# Patient Record
Sex: Female | Born: 1955
Health system: Southern US, Community
[De-identification: ages and names within clinical notes are randomized; demographics above are authoritative.]

## PROBLEM LIST (undated history)

## (undated) DIAGNOSIS — K224 Dyskinesia of esophagus: Secondary | ICD-10-CM

## (undated) DIAGNOSIS — M542 Cervicalgia: Secondary | ICD-10-CM

## (undated) DIAGNOSIS — M25519 Pain in unspecified shoulder: Secondary | ICD-10-CM

## (undated) DIAGNOSIS — D708 Other neutropenia: Secondary | ICD-10-CM

## (undated) DIAGNOSIS — E041 Nontoxic single thyroid nodule: Secondary | ICD-10-CM

## (undated) HISTORY — DX: Cervicalgia: M54.2

## (undated) HISTORY — DX: Other neutropenia: D70.8

## (undated) HISTORY — PX: BIOPSY THYROID: PRO38

## (undated) HISTORY — PX: HEMORROIDECTOMY: SUR656

## (undated) HISTORY — DX: Pain in unspecified shoulder: M25.519

## (undated) HISTORY — DX: Nontoxic single thyroid nodule: E04.1

---

## 2007-06-10 ENCOUNTER — Encounter (INDEPENDENT_AMBULATORY_CARE_PROVIDER_SITE_OTHER): Payer: Self-pay | Admitting: Ophthalmology

## 2007-06-10 ENCOUNTER — Ambulatory Visit (HOSPITAL_BASED_OUTPATIENT_CLINIC_OR_DEPARTMENT_OTHER): Admission: RE | Admit: 2007-06-10 | Discharge: 2007-06-10 | Payer: Self-pay | Admitting: Ophthalmology

## 2008-08-11 ENCOUNTER — Ambulatory Visit: Payer: Self-pay | Admitting: Internal Medicine

## 2008-08-11 DIAGNOSIS — R05 Cough: Secondary | ICD-10-CM | POA: Insufficient documentation

## 2008-11-02 ENCOUNTER — Ambulatory Visit: Payer: Self-pay | Admitting: Internal Medicine

## 2008-12-13 ENCOUNTER — Encounter: Payer: Self-pay | Admitting: Internal Medicine

## 2008-12-15 ENCOUNTER — Ambulatory Visit: Payer: Self-pay | Admitting: Internal Medicine

## 2008-12-15 ENCOUNTER — Telehealth: Payer: Self-pay | Admitting: Internal Medicine

## 2008-12-15 DIAGNOSIS — M5412 Radiculopathy, cervical region: Secondary | ICD-10-CM | POA: Insufficient documentation

## 2008-12-15 HISTORY — DX: Radiculopathy, cervical region: M54.12

## 2008-12-18 LAB — CONVERTED CEMR LAB
ALT: 15 units/L (ref 0–35)
Albumin: 4.3 g/dL (ref 3.5–5.2)
BUN: 7 mg/dL (ref 6–23)
Basophils Relative: 0.2 % (ref 0.0–3.0)
Bilirubin Urine: NEGATIVE
Bilirubin, Direct: 0.2 mg/dL (ref 0.0–0.3)
CO2: 30 meq/L (ref 19–32)
Calcium: 9.5 mg/dL (ref 8.4–10.5)
Creatinine, Ser: 0.5 mg/dL (ref 0.4–1.2)
Eosinophils Relative: 0.9 % (ref 0.0–5.0)
GFR calc Af Amer: 165 mL/min
Glucose, Bld: 92 mg/dL (ref 70–99)
HCT: 42.5 % (ref 36.0–46.0)
HDL: 69.9 mg/dL (ref >39.0)
Hemoglobin: 14.2 g/dL (ref 12.0–15.0)
Leukocytes, UA: NEGATIVE
Lymphocytes Relative: 66.2 % — ABNORMAL HIGH (ref 12.0–46.0)
Monocytes Absolute: 0.3 10*3/uL (ref 0.1–1.0)
Monocytes Relative: 9.2 % (ref 3.0–12.0)
Neutro Abs: 0.7 10*3/uL — ABNORMAL LOW (ref 1.4–7.7)
Nitrite: NEGATIVE
RBC: 4.61 M/uL (ref 3.87–5.11)
Specific Gravity, Urine: 1.01 (ref 1.000–1.03)
TSH: 0.76 microintl units/mL (ref 0.35–5.50)
Total CHOL/HDL Ratio: 2.5
Total Protein, Urine: NEGATIVE mg/dL
Total Protein: 7.2 g/dL (ref 6.0–8.3)
Triglycerides: 58 mg/dL (ref 0–149)
pH: 6.5 (ref 5.0–8.0)

## 2008-12-22 ENCOUNTER — Encounter: Payer: Self-pay | Admitting: Internal Medicine

## 2008-12-22 ENCOUNTER — Telehealth (INDEPENDENT_AMBULATORY_CARE_PROVIDER_SITE_OTHER): Payer: Self-pay | Admitting: *Deleted

## 2008-12-22 ENCOUNTER — Ambulatory Visit (HOSPITAL_COMMUNITY): Admission: RE | Admit: 2008-12-22 | Discharge: 2008-12-22 | Payer: Self-pay | Admitting: Internal Medicine

## 2008-12-22 ENCOUNTER — Ambulatory Visit: Payer: Self-pay | Admitting: Internal Medicine

## 2008-12-23 ENCOUNTER — Telehealth: Payer: Self-pay | Admitting: Pulmonary Disease

## 2008-12-25 LAB — CONVERTED CEMR LAB: Vit D, 1,25-Dihydroxy: 33 (ref 30–89)

## 2008-12-29 ENCOUNTER — Encounter: Payer: Self-pay | Admitting: Internal Medicine

## 2009-03-29 ENCOUNTER — Ambulatory Visit: Payer: Self-pay | Admitting: Internal Medicine

## 2009-04-10 ENCOUNTER — Encounter: Payer: Self-pay | Admitting: Internal Medicine

## 2009-05-04 ENCOUNTER — Ambulatory Visit: Payer: Self-pay | Admitting: Internal Medicine

## 2009-05-04 DIAGNOSIS — E041 Nontoxic single thyroid nodule: Secondary | ICD-10-CM

## 2009-05-07 ENCOUNTER — Encounter: Payer: Self-pay | Admitting: Internal Medicine

## 2009-05-11 ENCOUNTER — Encounter: Payer: Self-pay | Admitting: Internal Medicine

## 2009-05-11 ENCOUNTER — Ambulatory Visit (HOSPITAL_COMMUNITY): Admission: RE | Admit: 2009-05-11 | Discharge: 2009-05-11 | Payer: Self-pay | Admitting: Internal Medicine

## 2009-05-11 ENCOUNTER — Encounter (INDEPENDENT_AMBULATORY_CARE_PROVIDER_SITE_OTHER): Payer: Self-pay | Admitting: Interventional Radiology

## 2009-05-16 ENCOUNTER — Telehealth: Payer: Self-pay | Admitting: Internal Medicine

## 2009-05-16 ENCOUNTER — Encounter: Payer: Self-pay | Admitting: Internal Medicine

## 2009-05-18 HISTORY — PX: CERVICAL FUSION: SHX112

## 2009-05-18 HISTORY — PX: CERVICAL DISCECTOMY: SHX98

## 2009-06-14 ENCOUNTER — Telehealth: Payer: Self-pay | Admitting: Internal Medicine

## 2009-06-19 ENCOUNTER — Encounter: Admission: RE | Admit: 2009-06-19 | Discharge: 2009-08-09 | Payer: Self-pay | Admitting: Neurosurgery

## 2009-07-17 ENCOUNTER — Ambulatory Visit: Payer: Self-pay | Admitting: Internal Medicine

## 2009-08-20 ENCOUNTER — Encounter: Payer: Self-pay | Admitting: Internal Medicine

## 2009-08-31 ENCOUNTER — Ambulatory Visit: Payer: Self-pay | Admitting: Internal Medicine

## 2009-09-03 DIAGNOSIS — H6120 Impacted cerumen, unspecified ear: Secondary | ICD-10-CM | POA: Insufficient documentation

## 2009-09-03 HISTORY — DX: Impacted cerumen, unspecified ear: H61.20

## 2010-04-26 ENCOUNTER — Ambulatory Visit: Payer: Self-pay | Admitting: Internal Medicine

## 2010-04-26 DIAGNOSIS — T148 Other injury of unspecified body region: Secondary | ICD-10-CM

## 2010-04-26 DIAGNOSIS — W57XXXA Bitten or stung by nonvenomous insect and other nonvenomous arthropods, initial encounter: Secondary | ICD-10-CM

## 2010-07-31 ENCOUNTER — Ambulatory Visit: Payer: Self-pay | Admitting: Internal Medicine

## 2010-07-31 DIAGNOSIS — D708 Other neutropenia: Secondary | ICD-10-CM

## 2010-08-01 LAB — CONVERTED CEMR LAB
AST: 17 units/L (ref 0–37)
Albumin: 4.3 g/dL (ref 3.5–5.2)
Alkaline Phosphatase: 37 units/L — ABNORMAL LOW (ref 39–117)
Basophils Relative: 0.2 % (ref 0.0–3.0)
Bilirubin Urine: NEGATIVE
CO2: 30 meq/L (ref 19–32)
Calcium: 9.4 mg/dL (ref 8.4–10.5)
Eosinophils Absolute: 0 10*3/uL (ref 0.0–0.7)
HCT: 41.3 % (ref 36.0–46.0)
HDL: 73.6 mg/dL (ref >39.00)
Hemoglobin, Urine: NEGATIVE
Hemoglobin: 14.4 g/dL (ref 12.0–15.0)
Ketones, ur: NEGATIVE mg/dL
Lymphocytes Relative: 67.7 % — ABNORMAL HIGH (ref 12.0–46.0)
MCHC: 34.8 g/dL (ref 30.0–36.0)
Monocytes Relative: 5.7 % (ref 3.0–12.0)
Neutro Abs: 0.6 10*3/uL — ABNORMAL LOW (ref 1.4–7.7)
Potassium: 4.9 meq/L (ref 3.5–5.1)
RBC: 4.49 M/uL (ref 3.87–5.11)
Sodium: 143 meq/L (ref 135–145)
Total CHOL/HDL Ratio: 2
Total Protein: 6.9 g/dL (ref 6.0–8.3)
Urine Glucose: NEGATIVE mg/dL
Urobilinogen, UA: 0.2 (ref 0.0–1.0)
Vit D, 25-Hydroxy: 44 ng/mL (ref 30–89)

## 2010-12-24 NOTE — Assessment & Plan Note (Signed)
Summary: Primary svc/ CPX - ANC .7 > .6 now    Primary Provider/Referring Provider:  [facility]  CC:  CPX.  Fasting.  Marland Kitchen  History of Present Illness: 68 yobf smoked only in HS with new cough since 2007 indolent onset assoc with sensation of pnds more day than night minimally productive esp in am,  assoc with occ HB.  Cough resolved on ppi and decongestants.  July 31, 2010 cpx no complaints. Pt denies any significant sore throat, dysphagia, itching, sneezing,  nasal congestion or excess secretions,  fever, chills, sweats, unintended wt loss, pleuritic or exertional cp, hempoptysis, change in activity tolerance  orthopnea pnd or leg swelling        Current Medications (verified): 1)  Advil 200 Mg Tabs (Ibuprofen) .... 3-4 Advil With Meals Daily Prn  (Up To Advil As Long As With Meals)  Allergies (verified): No Known Drug Allergies  Past History:  Past Medical History: Health Maintenance     - CPX  July 31, 2010      - DT  December 15, 2008      - DEXA  12/22/2008  T spine + .9  LFem -1.2,  R Fem -.7     - GYN [place] Mammograms/pap smears Neck/shoulder pain radicular C7/8 right     - MRI  12/22/2008 Pos C7 disc protrusion > Discectomy and fusion 05/18/2009 [facility] [place] Thyroid nodule     - MRI 12/22/08     - U/S and bx  May 11, 2009 > c/w benign goiter, TSH nl so no rx Relative chronic Neutropenia       - ANC .7 2010       - ANC July 31, 2010   Family History: Mother died from ovarian cancer Father's family with throat cancer (smokers)  Lupus sister Negative for respiratory diseases  Allergies brother   Social History: Never smoker Married works in [employer] for [employer]  Vital Signs:  Patient profile:   55 year old female Height:      65 inches Weight:      128 pounds BMI:     21.38 O2 Sat:      98 % on Room air Temp:     98.2 degrees F oral Pulse rate:   72 / minute BP sitting:   110 / 80  (left arm) Cuff size:    regular  Vitals Entered By: Gweneth Dimitri RN (July 31, 2010 9:00 AM)  O2 Flow:  Room air CC: CPX.  Fasting.   Comments Medications reviewed with patient Daytime contact number verified with patient. Gweneth Dimitri RN  July 31, 2010 9:01 AM    Physical Exam  Additional Exam:  wt  128 Mar 29, 2009 > 129 May 05, 2009 >>126 August 31, 2009 > 128 April 26, 2010 > 128 .ate  Ambulatory healthy appearing in no acute distress. Afeb with normal vital signs HEENT: nl dentition, turbinates, and orophanx.  external ear canals severe  wax bilaterally, left > right  Neck without JVD/Nodes/TM/ no pain on flex/ext.  No palp thyroid nodules Lungs clear to A and P bilaterally without cough on insp or exp maneuvers RRR no s3 or murmur or increase in P2 Abd soft and benign with nl excursion in the supine position. No bruits or organomegaly Ext warm without calf tenderness, cyanosis clubbing or edema Skin: ok  Cholesterol  181 mg/dL                   1-610     ATP III Classification            Desirable:  < 200 mg/dL                    Borderline High:  200 - 239 mg/dL               High:  > = 240 mg/dL   Triglycerides             48.0 mg/dL                  9.6-045.4     Normal:  <150 mg/dL     Borderline High:  098 - 199 mg/dL   HDL                       11.91 mg/dL                 >47.82   VLDL Cholesterol          9.6 mg/dL                   9.5-62.1   LDL Cholesterol           98 mg/dL                    3-08  CHO/HDL Ratio:  CHD Risk                             2                    Men          Women     1/2 Average Risk     3.4          3.3     Average Risk          5.0          4.4     2X Average Risk          9.6          7.1     3X Average Risk          15.0          11.0                           Tests: (2) BMP (METABOL)   Sodium                    143 mEq/L                   135-145   Potassium                 4.9 mEq/L                   3.5-5.1   Chloride                   107 mEq/L                   96-112   Carbon Dioxide            30 mEq/L  19-32   Glucose                   86 mg/dL                    16-10   BUN                       6 mg/dL                     9-60   Creatinine                0.5 mg/dL                   4.5-4.0   Calcium                   9.4 mg/dL                   9.8-11.9   GFR                       184.97 mL/min               >60  Tests: (3) CBC Platelet w/Diff (CBCD)   White Cell Count     [L]  2.4 K/uL                    4.5-10.5     AUTO DIFF CONFIRMED ON SMEAR   Red Cell Count            4.49 Mil/uL                 3.87-5.11   Hemoglobin                14.4 g/dL                   14.7-82.9   Hematocrit                41.3 %                      36.0-46.0   MCV                       91.9 fl                     78.0-100.0   MCHC                      34.8 g/dL                   56.2-13.0   RDW                       12.9 %                      11.5-14.6   Platelet Count            161.0 K/uL                  150.0-400.0   Neutrophil %         [L]  25.6 %                      43.0-77.0   Lymphocyte %         [  H]  67.7 %                      12.0-46.0   Monocyte %                5.7 %                       3.0-12.0   Eosinophils%              0.8 %                       0.0-5.0   Basophils %               0.2 %                       0.0-3.0   Neutrophill Absolute [L]  0.6 K/uL                    1.4-7.7   Lymphocyte Absolute       1.6 K/uL                    0.7-4.0   Monocyte Absolute         0.1 K/uL                    0.1-1.0  Eosinophils, Absolute                             0.0 K/uL                    0.0-0.7   Basophils Absolute        0.0 K/uL                    0.0-0.1  Tests: (4) Hepatic/Liver Function Panel (HEPATIC)   Total Bilirubin           0.8 mg/dL                   1.6-1.0   Direct Bilirubin          0.1 mg/dL                   9.6-0.4   Alkaline Phosphatase [L]  37 U/L                       39-117   AST                       17 U/L                      0-37   ALT                       14 U/L                      0-35   Total Protein             6.9 g/dL                    5.4-0.9   Albumin                   4.3 g/dL  3.5-5.2  Tests: (5) TSH (TSH)   FastTSH                   1.19 uIU/mL                 0.35-5.50  Tests: (6) UDip Only (UDIP)   Color                     YELLOW       RANGE:  Yellow;Lt. Yellow   Clarity                   CLEAR                       Clear   Specific Gravity          1.020                       1.000 - 1.030   Urine Ph                  7.0                         5.0-8.0   Protein                   NEGATIVE                    Negative   Urine Glucose             NEGATIVE                    Negative   Ketones                   NEGATIVE                    Negative   Urine Bilirubin           NEGATIVE                    Negative   Blood                     NEGATIVE                    Negative   Urobilinogen              0.2                         0.0 - 1.0   Leukocyte Esterace        NEGATIVE                    Negative   Nitrite                   NEGATIVE                    Negative   Impression & Recommendations:  Problem # 1:  CERUMEN IMPACTION, BILATERAL (ICD-380.4) discussed use of otc's f/u here or ent as needed   Problem # 2:  COUGH (ICD-786.2)  Classic Upper airway cough syndrome, so named because it's frequently impossible to sort out how much is  CR/sinusitis with freq throat clearing (which can be related to primary GERD)  vs  causing  secondary extra esophageal GERD from wide swings in gastric pressure that occur with throat clearing, promoting self use of mint and menthol lozenges that reduce the lower esophageal sphincter tone and exacerbate the problem further These are the same pts who not infrequently have failed to tolerate ace inhibitors,  dry powder inhalers or biphosphonates or report having  reflux symptoms that don't respond to standard doses of PPI   Recs reviewed  Problem # 3:  OTHER NEUTROPENIA (ICD-288.09) 3.0 > 2.4 total wbc's with absolute .7 > .6 so not technically neutropenic but close, present x 2 years so this is likely constittutional, will discuss ? w/u with hematology.  Medications Added to Medication List This Visit: 1)  Advil 200 Mg Tabs (Ibuprofen) .... 3-4 advil with meals daily prn  (up to advil as long as with meals)  Other Orders: T-Vitamin D (25-Hydroxy) (98119-14782) Est. Patient 40-64 years (95621) TLB-Lipid Panel (80061-LIPID) TLB-BMP (Basic Metabolic Panel-BMET) (80048-METABOL) TLB-CBC Platelet - w/Differential (85025-CBCD) TLB-Hepatic/Liver Function Pnl (80076-HEPATIC) TLB-TSH (Thyroid Stimulating Hormone) (84443-TSH) TLB-Udip ONLY (81003-UDIP)  Patient Instructions: 1)  Ear wax removal kit is over the counter 2)  Use this first and call if the wax still bothers you  3)  Call [phone] for your results w/in next 3 days - if there's something important  I feel you need to know,  I'll be in touch with you directly.  4)  Centrum A to Z one daily and tums es after meals would you give you all the vit d and calcium you need 5)  CPX age 69

## 2010-12-24 NOTE — Assessment & Plan Note (Signed)
Summary: Primary svc/ insect bite / f/u thyroid bx   Primary Provider/Referring Provider:  [facility]  CC:  Acute visit.  Pt states that she was bit by some sort of insect i=on the back of left knee 6 days ago.  She states that it started out as blister and now the area is red and itchy.  She states that yesterday she was feeling lightheaded and nauseas.  .  History of Present Illness: 79 yobf smoked only in HS with new cough since 2007 indolent onset assoc with sensation of pnds more day than night minimally productive esp in am,  assoc with occ HB.  no response to mucinex or advil cold and sinus to date.  08/11/08 initial eval rx with ppi and chlortrimeton and diet but not consistent with either.  November 02, 2008  99% better but still coughing daily, mostly throat clearing, not bothering sleep, no assoc itching or sneezing.  Pt also denies any obvious fluctuation in symptoms with weather or environmental change or other alleviating or aggravating factors.  Pt also denies any obvious fluctuation in symptoms with weather or environmental change or other alleviating or aggravating factors.   In terms of palpitations does not having exac with ex and activity tolerance is nl.   December 15, 2008 cpx stopped nexium shortly after started actifed,  3 -4 months right > left numbness in right 4th and 5th fingers  Mar 29, 2009 worse pain and numbness esp C7/8 distribution  on right , no better on relafen, has not tried relafen. No fever, leg weakness, change in bowel or bladder habits.  May 04, 2009 ov still with right shoulder pain and C7/8 numbness extending to 4th and 5th digits,  aggravated by sitting at computer,  tried relafen upset stomach, ultimately had fusion in [place] 6/25  April 26, 2010 Acute visit.  Pt states that she was bit by some sort of insect  back of left knee 6 days ago.  She states that it started out as blister and now the area is red and itchy.  She states that  yesterday she was feeling lightheaded and nauseas.  benadryl helped some, no generalized itching or sob. Pt denies any significant sore throat, dysphagia, sneezing,  nasal congestion or excess secretions,  fever, chills, sweats, unintended wt loss, pleuritic or exertional cp, hempoptysis, change in activity tolerance  orthopnea pnd or leg swelling         Current Medications (verified): 1)  Tramadol Hcl 50 Mg  Tabs (Tramadol Hcl) .... One To Two By Mouth Every 4-6 Hours As Needed For Pain 2)  Advil 200 Mg Tabs (Ibuprofen) .... 3-4 Advil With Meals Daily (Up To Advil As Long As With Meals)  Allergies (verified): No Known Drug Allergies  Past History:  Past Medical History: Health Maintenance     - CPX  12/15/2008     - DT  December 15, 2008      - DEXA  12/22/2008  T spine + .9  LFem -1.2,  R Fem -.7 Neck/shoulder pain radicular C7/8 right     - MRI  12/22/2008 Pos C7 disc protrusion > Discectomy and fusion 05/18/2009 [facility] [place] Thyroid nodule     - MRI 12/22/08     - U/S and bx  May 11, 2009 > c/w benign goiter, TSH nl so no rx  Vital Signs:  Patient profile:   55 year old female Weight:      128  pounds O2 Sat:      99 % on Room air Temp:     98.3 degrees F oral Pulse rate:   83 / minute BP sitting:   128 / 88  (left arm)  Vitals Entered By: Vernie Murders (April 26, 2010 9:43 AM)  O2 Flow:  Room air  Physical Exam  Additional Exam:  wt  128 Mar 29, 2009 > 129 May 05, 2009 >>126 August 31, 2009 > 128 April 26, 2010  Ambulatory healthy appearing in no acute distress. Afeb with normal vital signs HEENT: nl dentition, turbinates, and orophanx.  external ear canals moderate wax bilaterally, left > right  Neck without JVD/Nodes/TM/ no pain on flex/ext.  No palp thyroid nodules Lungs clear to A and P bilaterally without cough on insp or exp maneuvers RRR no s3 or murmur or increase in P2 Abd soft and benign with nl excursion in the supine position. No bruits or  organomegaly Ext warm without calf tenderness, cyanosis clubbing or edema Skin:  punctate raised ulcerated lesion  less than 5 mm diam with slt raised borders and surrounding secondary changes about the side of palm in fold of L knee posteriorly with erthema, no induration.   Impression & Recommendations:  Problem # 1:  INSECT BITE (ICD-919.4)  With secondary localized nflammatory changes, rx with H1 and steroid cream, no evidence of spider bite.  Problem # 2:  THYROID NODULE (ICD-241.0)  reviewed bx and tft result, exam nl, so no further studies needed as this was benign and not a clinically active goiter.  Problem # 3:  CERVICAL RADICULOPATHY (ICD-723.4) much better post op with return of strenght though still has paresthesias in dominant hand  Medications Added to Medication List This Visit: 1)  Triamcinolone Acetonide 0.1 % Oint (Triamcinolone acetonide) .... Apply twice daily for up to 2 weeks not be applied to face  Other Orders: Est. Patient Level IV (46962)  Patient Instructions: 1)  Benadryl will help the itching 2)  apply triamcinolone ointment twice daily to rash 3)  If not better in 2 weeks call for dermatology referral Prescriptions: TRIAMCINOLONE ACETONIDE 0.1 % OINT (TRIAMCINOLONE ACETONIDE) apply twice daily for up to 2 weeks not be applied to face  #60 gm x 1   Entered and Authorized by:   Nyoka Cowden MD   Signed by:   Nyoka Cowden MD on 04/26/2010   Method used:   Electronically to        [id number] (retail)       [address]       [place], [place]  [address]       Ph: [phone] or [phone]       Fax: [phone]   RxID:   [id number]

## 2011-07-10 ENCOUNTER — Encounter: Payer: Self-pay | Admitting: Internal Medicine

## 2011-07-11 ENCOUNTER — Encounter: Payer: Self-pay | Admitting: Gastroenterology

## 2011-07-11 ENCOUNTER — Other Ambulatory Visit (INDEPENDENT_AMBULATORY_CARE_PROVIDER_SITE_OTHER): Payer: BC Managed Care – PPO

## 2011-07-11 ENCOUNTER — Ambulatory Visit (INDEPENDENT_AMBULATORY_CARE_PROVIDER_SITE_OTHER): Payer: BC Managed Care – PPO | Admitting: Internal Medicine

## 2011-07-11 ENCOUNTER — Encounter: Payer: Self-pay | Admitting: Internal Medicine

## 2011-07-11 VITALS — BP 112/72 | HR 77 | Temp 97.7°F | Ht 65.0 in | Wt 129.8 lb

## 2011-07-11 DIAGNOSIS — K625 Hemorrhage of anus and rectum: Secondary | ICD-10-CM

## 2011-07-11 DIAGNOSIS — Z0000 Encounter for general adult medical examination without abnormal findings: Secondary | ICD-10-CM

## 2011-07-11 DIAGNOSIS — I4949 Other premature depolarization: Secondary | ICD-10-CM

## 2011-07-11 DIAGNOSIS — I493 Ventricular premature depolarization: Secondary | ICD-10-CM | POA: Insufficient documentation

## 2011-07-11 DIAGNOSIS — D708 Other neutropenia: Secondary | ICD-10-CM

## 2011-07-11 HISTORY — DX: Hemorrhage of anus and rectum: K62.5

## 2011-07-11 LAB — CBC WITH DIFFERENTIAL/PLATELET
Basophils Relative: 0.3 % (ref 0.0–3.0)
Eosinophils Absolute: 0 10*3/uL (ref 0.0–0.7)
Hemoglobin: 14 g/dL (ref 12.0–15.0)
MCHC: 33 g/dL (ref 30.0–36.0)
MCV: 93.4 fl (ref 78.0–100.0)
Monocytes Absolute: 0.2 10*3/uL (ref 0.1–1.0)
Neutro Abs: 0.6 10*3/uL — ABNORMAL LOW (ref 1.4–7.7)
RBC: 4.55 Mil/uL (ref 3.87–5.11)

## 2011-07-11 NOTE — Patient Instructions (Addendum)
Please see patient coordinator before you leave today  to schedule referral to GI due to history of polyps  Citrucel heaping twice daily with large glass of water  Dr Billy [facility] and [facility] are ones I recommend especially if they are planning to do EPIC  Please schedule a follow up visit in 3 months but call sooner if needed for CPX on return

## 2011-07-11 NOTE — Progress Notes (Signed)
  Subjective:    Patient ID: Mariah Everett, female    DOB: [birth year], 55 y.o.   MRN: [id number]  HPI  34 yobf smoked only in HS with new cough since 2007 indolent onset assoc with sensation of pnds more day than night minimally productive esp in am, assoc with occ HB.  >>> resolved on ppi and decongestants.   July 31, 2010 cpx no complaints. No change rx  07/11/2011 f/u ov/Mariah Everett cc blood in stool  Maybe once a week x sev months, has h/o polyps in [place] 6 years ago, wants to establish here. No abd pain, no melena, constipation.  Some abd bloating.  Also occ palpitations  Pt denies any significant sore throat, dysphagia, itching, sneezing,  nasal congestion or excess/ purulent secretions,  fever, chills, sweats, unintended wt loss, pleuritic or exertional cp, hempoptysis, orthopnea pnd or leg swelling.    Also denies any obvious fluctuation of symptoms with weather or environmental changes or other aggravating or alleviating factors.        Past Medical History:  Health Maintenance  - CPX July 31, 2010  - DT December 15, 2008  - DEXA 12/22/2008 T spine + .9 LFem -1.2, R Fem -.7  - GYN [facility] Mammograms/pap smears  Neck/shoulder pain radicular C7/8 right  - MRI 12/22/2008 Pos C7 disc protrusion > Discectomy and fusion 05/18/2009 [facility] [place]  Thyroid nodule  - MRI 12/22/08  - U/S and bx May 11, 2009 > c/w benign goiter, TSH nl so no rx  Relative chronic Neutropenia  - dx  2010  Rectal bleeeding...................................Marland [facility]    Family History:  Mother died from ovarian cancer  Father's family with throat cancer (smokers)  Lupus sister  Negative for respiratory diseases  Allergies brother    Social History:  Never smoker  Married  works in [employer] for [employer]      Review of Systems  Constitutional: Negative for fever and unexpected weight change.  HENT: Negative for ear pain, nosebleeds, congestion, sore throat, rhinorrhea, sneezing,  trouble swallowing, dental problem, postnasal drip and sinus pressure.   Eyes: Negative for redness and itching.  Respiratory: Negative for cough, chest tightness, shortness of breath and wheezing.   Cardiovascular: Negative for palpitations and leg swelling.  Gastrointestinal: Positive for blood in stool. Negative for nausea and vomiting.  Genitourinary: Negative for dysuria.  Musculoskeletal: Negative for joint swelling.  Skin: Negative for rash.  Neurological: Negative for headaches.  Hematological: Does not bruise/bleed easily.  Psychiatric/Behavioral: Negative for dysphoric mood. The patient is not nervous/anxious.        Objective:   Physical Exam   wt 128 Mar 29, 2009 > 129 May 05, 2009 >>126 August 31, 2009 > 128 April 26, 2010 > 129 07/11/2011  Ambulatory healthy appearing in no acute distress.  Afeb with normal vital signs  HEENT: nl dentition, turbinates, and orophanx. external ear canals severe wax bilaterally, left > right  Neck without JVD/Nodes/TM/ no pain on flex/ext. No palp thyroid nodules  Lungs clear to A and P bilaterally without cough on insp or exp maneuvers  RRR no s3 or murmur or increase in P2  Abd soft and benign with nl excursion in the supine position. No bruits or organomegaly  Ext warm without calf tenderness, cyanosis clubbing or edema     Assessment & Plan:

## 2011-07-12 NOTE — Assessment & Plan Note (Signed)
Chronic, asymptomatic, ANC usuall runs around 7-800 no changes planned

## 2011-07-12 NOTE — Assessment & Plan Note (Signed)
Hemoglobin ok. rec citrucel for bloating/ ? Constipation with hem bleeding pending GI eval

## 2011-07-12 NOTE — Assessment & Plan Note (Signed)
Single pvc on ekg, avoid caffeine and decongestants

## 2011-07-21 ENCOUNTER — Telehealth: Payer: Self-pay | Admitting: Internal Medicine

## 2011-07-21 NOTE — Telephone Encounter (Signed)
Call patient : Study is unremarkable, no change in recs- no evidence of fe def or anemia   Spoke with pt and is aware of lab results. Pt verbalized understanding and had no questions

## 2011-08-05 ENCOUNTER — Ambulatory Visit: Payer: BC Managed Care – PPO | Admitting: Gastroenterology

## 2011-09-15 ENCOUNTER — Encounter: Payer: Self-pay | Admitting: Internal Medicine

## 2012-09-16 ENCOUNTER — Encounter: Payer: Self-pay | Admitting: Internal Medicine

## 2012-09-16 ENCOUNTER — Ambulatory Visit (INDEPENDENT_AMBULATORY_CARE_PROVIDER_SITE_OTHER): Payer: BC Managed Care – PPO | Admitting: Internal Medicine

## 2012-09-16 ENCOUNTER — Other Ambulatory Visit (INDEPENDENT_AMBULATORY_CARE_PROVIDER_SITE_OTHER): Payer: BC Managed Care – PPO

## 2012-09-16 VITALS — BP 114/80 | HR 85 | Temp 97.5°F | Ht 65.0 in | Wt 134.0 lb

## 2012-09-16 DIAGNOSIS — M5412 Radiculopathy, cervical region: Secondary | ICD-10-CM

## 2012-09-16 DIAGNOSIS — Z0000 Encounter for general adult medical examination without abnormal findings: Secondary | ICD-10-CM

## 2012-09-16 DIAGNOSIS — H6120 Impacted cerumen, unspecified ear: Secondary | ICD-10-CM

## 2012-09-16 DIAGNOSIS — D708 Other neutropenia: Secondary | ICD-10-CM

## 2012-09-16 DIAGNOSIS — K625 Hemorrhage of anus and rectum: Secondary | ICD-10-CM

## 2012-09-16 LAB — HEPATIC FUNCTION PANEL
ALT: 18 U/L (ref 0–35)
AST: 21 U/L (ref 0–37)
Albumin: 4.2 g/dL (ref 3.5–5.2)
Total Protein: 7.4 g/dL (ref 6.0–8.3)

## 2012-09-16 LAB — CBC WITH DIFFERENTIAL/PLATELET
Basophils Relative: 0.5 % (ref 0.0–3.0)
Eosinophils Relative: 1.5 % (ref 0.0–5.0)
HCT: 43.2 % (ref 36.0–46.0)
Hemoglobin: 14.3 g/dL (ref 12.0–15.0)
Lymphs Abs: 1.4 10*3/uL (ref 0.7–4.0)
MCV: 92.2 fl (ref 78.0–100.0)
Monocytes Absolute: 0.2 10*3/uL (ref 0.1–1.0)
Monocytes Relative: 8.9 % (ref 3.0–12.0)
Neutro Abs: 0.6 10*3/uL — ABNORMAL LOW (ref 1.4–7.7)
Platelets: 193 10*3/uL (ref 150.0–400.0)
WBC: 2.3 10*3/uL — ABNORMAL LOW (ref 4.5–10.5)

## 2012-09-16 LAB — BASIC METABOLIC PANEL
BUN: 7 mg/dL (ref 6–23)
Chloride: 108 mEq/L (ref 96–112)
GFR: 139.76 mL/min (ref >60.00)
Glucose, Bld: 94 mg/dL (ref 70–99)
Potassium: 4.2 mEq/L (ref 3.5–5.1)
Sodium: 142 mEq/L (ref 135–145)

## 2012-09-16 LAB — LIPID PANEL
Cholesterol: 182 mg/dL (ref 0–200)
LDL Cholesterol: 102 mg/dL — ABNORMAL HIGH (ref 0–99)
Triglycerides: 43 mg/dL (ref 0.0–149.0)

## 2012-09-16 NOTE — Assessment & Plan Note (Signed)
Mod severe, rec otc's and f/u for lavage

## 2012-09-16 NOTE — Assessment & Plan Note (Signed)
Lab Results  Component Value Date   WBC 2.3 aL* 09/16/2012    No change baseline nor evidence of infection tendency so likley constitutional.

## 2012-09-16 NOTE — Assessment & Plan Note (Signed)
Negative colonoscopy 09/12/11  Dr Sydell Axon  No sign anemia and with neg colonoscopy, assoc with straining this is most likely hemorrhoidal > rec stool softeners and gi f/u prn

## 2012-09-16 NOTE — Progress Notes (Signed)
Subjective:    Patient ID: Mariah Everett, female    DOB: Oct 11, 1956  MRN: [id number]  HPI  68 yobf smoked only in HS with new cough since 2007 indolent onset assoc with sensation of pnds more day than night minimally productive esp in am, assoc with occ HB.  >>> resolved on ppi and decongestants.   July 31, 2010 cpx no complaints. No change rx  07/11/2011 f/u ov/Mariah Everett cc blood in stool  Maybe once a week x sev months, has h/o polyps in [place] 6 years ago, wants to establish here. No abd pain, no melena, constipation.  Some abd bloating. rec Please see patient coordinator before you leave today  to schedule referral to GI due to history of polyps> neg colonoscopy 08/2011 Citrucel heaping twice daily with large glass of water   09/16/2012 f/u ov/Mariah Everett cc still brpb q 3-4 months lasts a day, attributed to straining - no abd pain, no melena, no wt loss or appetite decrease.  Sleeping ok without nocturnal  or early am exacerbation  of respiratory  C/o's.  Also denies any obvious fluctuation of symptoms with weather or environmental changes or other aggravating or alleviating factors except as outlined above   ROS  The following are not active complaints unless bolded sore throat, dysphagia, dental problems, itching, sneezing,  nasal congestion or excess/ purulent secretions, ear ache,   fever, chills, sweats, unintended wt loss, pleuritic or exertional cp, hemoptysis,  orthopnea pnd or leg swelling, presyncope, palpitations, heartburn, abdominal pain, anorexia, nausea, vomiting, diarrhea  or change in bowel or urinary habits, change in stools or urine, dysuria,hematuria,  rash, arthralgias, visual complaints, headache, numbness weakness or ataxia or problems with walking or coordination,  change in mood/affect or memory.        Past Medical History:  Health Maintenance  - CPX 09/16/2012   - DT December 15, 2008  - DEXA 12/22/2008 T spine + .9 LFem -1.2, R Fem -.7  - GYN Mariah Everett Neck/shoulder  pain radicular C7/8 right  - MRI 12/22/2008 Pos C7 disc protrusion > Discectomy and fusion 05/18/2009 [facility] in [place]  Thyroid nodule  - MRI 12/22/08  - U/S and bx May 11, 2009 > c/w benign goiter, TSH nl so no rx  Relative chronic Neutropenia  - dx  2010  Rectal bleeeding...................................Marland Kitchen  Dr Mariah Everett - nl colonoscopy 08/2011     Family History:  Mother died from ovarian cancer  Father's family with throat cancer (smokers)  Lupus sister  Negative for respiratory diseases  Allergies brother    Social History:  Never smoker  Married  works in [employer] for [employer]            Objective:   Physical Exam   wt 128 Mar 29, 2009  > 128 April 26, 2010 > 129 07/11/2011 > 09/16/2012 134  Ambulatory healthy appearing in no acute distress.  HEENT: nl dentition, turbinates, and orophanx. external ear canals severe wax bilaterally   Neck without JVD/Nodes/TM/ no pain on flex/ext. No palp thyroid nodules  Lungs clear to A and P bilaterally without cough on insp or exp maneuvers  RRR no s3 or murmur or increase in P2  Nl pulses Abd soft and benign with nl excursion in the supine position. No bruits or organomegaly  Ext warm without calf tenderness, cyanosis clubbing or edema MS nl gait no deformities/ restrictions Neuro nl sensorium, no motor or cerebellar def, no pathologic reflexes Skin: no rash, no lesions  Assessment & Plan:

## 2012-09-16 NOTE — Patient Instructions (Addendum)
Please remember to go to the lab department downstairs for your tests - we will call you with the results when they are available.  Try the over the counter ear wax kit for about a week then make an appt to see Tammy NP after you've softened it up and it should be easy to remove  Please schedule a follow up office visit in 1 year, call sooner if needed - CPX on return

## 2012-09-17 LAB — VITAMIN D 25 HYDROXY (VIT D DEFICIENCY, FRACTURES): Vit D, 25-Hydroxy: 49 ng/mL (ref 30–89)

## 2012-09-22 NOTE — Progress Notes (Signed)
Quick Note:  Spoke with pt and notified of results per Dr. Wert. Pt verbalized understanding and denied any questions.  ______ 

## 2012-12-09 ENCOUNTER — Telehealth: Payer: Self-pay | Admitting: Internal Medicine

## 2012-12-09 NOTE — Telephone Encounter (Signed)
I spoke with pt. Aware of MW recs. She will check with insurance and will call us back. Will sign off message in the meantime

## 2012-12-09 NOTE — Telephone Encounter (Signed)
Yes refer to urology - best option is Eudelia Bunch but he is at [facility] and not sure her insurance will cover or won't have to wait x months

## 2012-12-09 NOTE — Telephone Encounter (Signed)
Pt c/o pain and burning with urination and frequency and urgency as well x 2-3 weeks. She did call her OB/GYN and they did a UA culture and results were negative. They also gave her Septra x 3 days and then Cipro x 7 days. The pain in her lower abdomen did ease some while on the abx but never went away. The OB/GYN told her she needs to see a Insurance underwriter. She is calling to get recs from MW, PCP. Pls advise.No Known Allergies

## 2012-12-10 ENCOUNTER — Encounter: Payer: Self-pay | Admitting: Adult Health

## 2012-12-10 ENCOUNTER — Other Ambulatory Visit (INDEPENDENT_AMBULATORY_CARE_PROVIDER_SITE_OTHER): Payer: BC Managed Care – PPO

## 2012-12-10 ENCOUNTER — Ambulatory Visit (INDEPENDENT_AMBULATORY_CARE_PROVIDER_SITE_OTHER): Payer: BC Managed Care – PPO | Admitting: Adult Health

## 2012-12-10 ENCOUNTER — Telehealth: Payer: Self-pay | Admitting: Internal Medicine

## 2012-12-10 VITALS — BP 138/88 | HR 82 | Temp 97.4°F | Ht 65.0 in | Wt 135.8 lb

## 2012-12-10 DIAGNOSIS — R109 Unspecified abdominal pain: Secondary | ICD-10-CM

## 2012-12-10 DIAGNOSIS — R300 Dysuria: Secondary | ICD-10-CM

## 2012-12-10 DIAGNOSIS — R52 Pain, unspecified: Secondary | ICD-10-CM

## 2012-12-10 DIAGNOSIS — R350 Frequency of micturition: Secondary | ICD-10-CM

## 2012-12-10 LAB — URINALYSIS, ROUTINE W REFLEX MICROSCOPIC
Nitrite: NEGATIVE
Total Protein, Urine: NEGATIVE
pH: 6 (ref 5.0–8.0)

## 2012-12-10 LAB — HEPATIC FUNCTION PANEL
ALT: 18 U/L (ref 0–35)
AST: 19 U/L (ref 0–37)
Alkaline Phosphatase: 39 U/L (ref 39–117)
Total Bilirubin: 0.8 mg/dL (ref 0.3–1.2)

## 2012-12-10 LAB — CBC WITH DIFFERENTIAL/PLATELET
Basophils Absolute: 0 10*3/uL (ref 0.0–0.1)
Eosinophils Absolute: 0 10*3/uL (ref 0.0–0.7)
Hemoglobin: 14 g/dL (ref 12.0–15.0)
Lymphocytes Relative: 44 % (ref 12.0–46.0)
MCHC: 33.4 g/dL (ref 30.0–36.0)
MCV: 91 fl (ref 78.0–100.0)
Monocytes Absolute: 0.3 10*3/uL (ref 0.1–1.0)
Neutro Abs: 2.3 10*3/uL (ref 1.4–7.7)
RDW: 12.2 % (ref 11.5–14.6)

## 2012-12-10 LAB — BASIC METABOLIC PANEL
Calcium: 9.6 mg/dL (ref 8.4–10.5)
Chloride: 104 mEq/L (ref 96–112)
Creatinine, Ser: 0.6 mg/dL (ref 0.4–1.2)
GFR: 126.73 mL/min (ref >60.00)

## 2012-12-10 MED ORDER — HYDROCORTISONE 2.5 % RE CREA: TOPICAL_CREAM | Freq: Two times a day (BID) | RECTAL | Status: DC

## 2012-12-10 MED ORDER — HYDROCODONE-ACETAMINOPHEN 5-500 MG PO TABS: 1.0000 | ORAL_TABLET | Freq: Four times a day (QID) | ORAL | Status: DC | PRN

## 2012-12-10 MED ORDER — PHENAZOPYRIDINE HCL 100 MG PO TABS: 100.0000 mg | ORAL_TABLET | Freq: Three times a day (TID) | ORAL | Status: DC | PRN

## 2012-12-10 NOTE — Progress Notes (Signed)
Subjective:    Patient ID: Mariah Everett, female    DOB: [birth year]  MRN: [id number]  HPI  54 yobf smoked only in HS with new cough since 2007 indolent onset assoc with sensation of pnds more day than night minimally productive esp in am, assoc with occ HB.  >>> resolved on ppi and decongestants.   July 31, 2010 cpx no complaints. No change rx  07/11/2011 f/u ov/Wert cc blood in stool  Maybe once a week x sev months, has h/o polyps in [place] 6 years ago, wants to establish here. No abd pain, no melena, constipation.  Some abd bloating. rec Please see patient coordinator before you leave today  to schedule referral to GI due to history of polyps> neg colonoscopy 08/2011 Citrucel heaping twice daily with large glass of water   09/16/2012 f/u ov/Wert cc still brpb q 3-4 months lasts a day, attributed to straining - no abd pain, no melena, no wt loss or appetite decrease.   12/10/12 Acute OV  Presents for persistent lower abdominal pain and supra pubic pressure and dysuria  Complains of  dysuria, burning, increased frequency x2 weeks    Does have some flank pain w/ radiation to sides. Dull ache at times w/ no waves of pain  Main issue is she has suprapubic pain and pressures w/ urinary urgency and then severe burning w/ urination . Azo not helping.  Has seen GYN x 2 . Given septra initially, symptoms improved but returned when off abx. Rx Cipro w/ improvement. Off abx symptoms returned. Cx by GYN reported to pt as neg.  No records currently available.  Has had several episodes of bright red blood in toilet after BM . Taking advil round the clock last 2 weeks for pain in suprapubic region.  Was seen by GI yesterday. Told she has hemorrhoids . Set up for CT abd and pelvis next week.  No fever, n/v/d. No constipation or hard stools. No straining.  UA today w/ sm blood otherwise neg.  Has been referred to Urology w/ ov on 1/20    Past Medical History:  Health Maintenance  - CPX 09/16/2012    - DT December 15, 2008  - DEXA 12/22/2008 T spine + .9 LFem -1.2, R Fem -.7  - GYN Tavon Neck/shoulder pain radicular C7/8 right  - MRI 12/22/2008 Pos C7 disc protrusion > Discectomy and fusion 05/18/2009 [facility] in [place]  Thyroid nodule  - MRI 12/22/08  - U/S and bx May 11, 2009 > c/w benign goiter, TSH nl so no rx  Relative chronic Neutropenia  - dx  2010  Rectal bleeeding...................................Marland Kitchen  Dr Particia Nearing [name] - nl colonoscopy 08/2011     Family History:  Mother died from ovarian cancer  Father's family with throat cancer (smokers)  Lupus sister  Negative for respiratory diseases  Allergies brother    Social History:  Never smoker  Married  works in [employer] for [employer]    ROS:  Constitutional:   No  weight loss, night sweats,  Fevers, chills,  =fatigue, or  lassitude.  HEENT:   No headaches,  Difficulty swallowing,  Tooth/dental problems, or  Sore throat,                No sneezing, itching, ear ache, nasal congestion, post nasal drip,   CV:  No chest pain,  Orthopnea, PND, swelling in lower extremities, anasarca, dizziness, palpitations, syncope.   GI  No   nausea, vomiting, diarrhea, change in bowel habits,  loss of appetite, bloody stools.   Resp: No shortness of breath with exertion or at rest.  No excess mucus, no productive cough,  No non-productive cough,  No coughing up of blood.  No change in color of mucus.  No wheezing.  No chest wall deformity  Skin: no rash or lesions.  GU:   no hematuria   MS:  No joint pain or swelling.  No decreased range of motion.  No back pain.  Psych:  No change in mood or affect. No depression or anxiety.  No memory loss.             Objective:   Physical Exam   wt 128 Mar 29, 2009  > 128 April 26, 2010 > 129 07/11/2011 > 09/16/2012 134 >135 1/17  Ambulatory healthy appearing in no acute distress.  HEENT: nl dentition, turbinates, and orophanx  Neck without JVD/Nodes/TM/ no pain on flex/ext. No palp  thyroid nodules  Lungs clear to A and P bilaterally without cough on insp or exp maneuvers  RRR no s3 or murmur or increase in P2  Nl pulses Abd generalized tenderness , BS +  No bruits or organomegaly , neg CVA tenderness  Rectal : several small to mod sized external hemorrhoids  Ext warm without calf tenderness, cyanosis clubbing or edema MS nl gait no deformities/ restrictions Neuro nl sensorium, no motor or cerebellar def, no pathologic reflexes Skin: no rash, no lesions  UA :  Urinalysis    Component Value Date/Time   COLORURINE LT. YELLOW 12/10/2012 1448   APPEARANCEUR CLEAR 12/10/2012 1448   LABSPEC <=1.005 12/10/2012 1448   PHURINE 6.0 12/10/2012 1448   HGBUR SMALL 12/10/2012 1448   BILIRUBINUR NEGATIVE 12/10/2012 1448   KETONESUR NEGATIVE 12/10/2012 1448   UROBILINOGEN 0.2 12/10/2012 1448   NITRITE NEGATIVE 12/10/2012 1448   LEUKOCYTESUR TRACE 12/10/2012 1448         Assessment & Plan:

## 2012-12-10 NOTE — Telephone Encounter (Signed)
Called, spoke with pt. Informed her of below per Dr. Sherene Sires.  She verbalized understanding of this and is aware I have placed an order for this.  She verbalized understanding, is aware she will receive a call back regarding appt date, time, and location, and voiced no further questions or concerns at this time.  ** Pt did confirm her appt with TP for today at 2:45 pm.

## 2012-12-10 NOTE — Telephone Encounter (Signed)
I spoke with pt and she stated She could not get in with Dr. Logan Bores until March. She states this is to far out. Wants to know who else Dr. Sherene Sires would recommend she see. Please advise Dr. Sherene Sires thanks

## 2012-12-10 NOTE — Patient Instructions (Addendum)
I will call with lab results  Protocol HC cream after bowel movements.  Vicodin 1/2-1 every 6 hrs as needed for pain.  Begin Prilosec 20mg  daily for 2 weeks  Gas x As needed  Bloating  Pyridium 100mg  Three times a day  As needed  Urinary burning.  Do not use NSAIDS, advil, ibuprofen, aleve like meds.  Call back regarding your CT abdomen and pelvis scheduling.  Please contact office for sooner follow up if symptoms do not improve or worsen or seek emergency care  follow up Dr. Sherene Sires  In 2 weeks and As needed

## 2012-12-10 NOTE — Telephone Encounter (Signed)
Any of ours across the street or the [facility] in [place]

## 2012-12-12 DIAGNOSIS — R109 Unspecified abdominal pain: Secondary | ICD-10-CM | POA: Insufficient documentation

## 2012-12-12 LAB — URINE CULTURE: Colony Count: NO GROWTH

## 2012-12-12 NOTE — Assessment & Plan Note (Addendum)
Lower Abdominal pain x 2 weeks ? Etiology w/ associated suprapubic pressure, urinary urgency, dysuria and some flank pain.  Has been tx w/ 2 abx by GYN for suspected UTI. Reported urine cx neg.  Also with rectal bleeding/[facility] vs   Bloody stools -w/ GI evaluation . CT abd /pelvis pending for next week.  Previous Colonoscopy 2012 neg >check labs looking for drop in Hgb , tx w/ PPI (as taking Advil )  tx Hems. Recommended CT abd /pelvis -pt wants to make sure her insurance has prior auth done prior to moving it up . Also w/ ov w/ URO in 3 days .   Plan  Protocol HC cream after bowel movements.  Vicodin 1/2-1 every 6 hrs as needed for pain.  Begin Prilosec 20mg  daily for 2 weeks  Gas x As needed  Bloating  Pyridium 100mg  Three times a day  As needed  Urinary burning.  Do not use NSAIDS, advil, ibuprofen, aleve like meds.  Call back regarding your CT abdomen and pelvis scheduling.  Please contact office for sooner follow up if symptoms do not improve or worsen or seek emergency care  follow up Dr. Sherene Sires  In 2 weeks and As needed

## 2012-12-14 NOTE — Progress Notes (Signed)
Quick Note:  Called spoke with patient, advised of lab results / recs as stated by TP. Pt verbalized her understanding and denied any questions. ______ 

## 2013-07-20 ENCOUNTER — Encounter: Payer: Self-pay | Admitting: Internal Medicine

## 2013-07-20 ENCOUNTER — Ambulatory Visit (INDEPENDENT_AMBULATORY_CARE_PROVIDER_SITE_OTHER): Payer: BC Managed Care – PPO | Admitting: Internal Medicine

## 2013-07-20 VITALS — BP 124/84 | HR 80 | Temp 97.9°F | Ht 66.0 in | Wt 135.4 lb

## 2013-07-20 DIAGNOSIS — R109 Unspecified abdominal pain: Secondary | ICD-10-CM

## 2013-07-20 NOTE — Progress Notes (Signed)
Subjective:    Patient ID: Mariah Everett, female    DOB: [birth year]  MRN: [id number]    Brief patient profile:  49 yobf smoked only in HS with new cough since 2007 indolent onset assoc with sensation of pnds more day than night minimally productive esp in am, assoc with occ HB.  >>> resolved on ppi and decongestants.      07/11/2011 f/u ov/Mariah Everett cc blood in stool  Maybe once a week x sev months, has h/o polyps in [place] 6 years ago, wants to establish here. No abd pain, no melena, constipation.  Some abd bloating. rec Please see patient coordinator before you leave today  to schedule referral to GI due to history of polyps> neg colonoscopy 08/2011 Citrucel heaping twice daily with large glass of water   09/16/2012 f/u ov/Mariah Everett cc still brpb q 3-4 months lasts a day, attributed to straining - no abd pain, no melena, no wt loss or appetite decrease.   12/10/12 Acute OV  Presents for persistent lower abdominal pain and supra pubic pressure and dysuria  Complains of  dysuria, burning, increased frequency x2 weeks    Does have some flank pain w/ radiation to sides. Dull ache at times w/ no waves of pain  Main issue is she has suprapubic pain and pressures w/ urinary urgency and then severe burning w/ urination . Azo not helping.  Has seen GYN x 2 . Given septra initially, symptoms improved but returned when off abx. Rx Cipro w/ improvement. Off abx symptoms returned. Cx by GYN reported to pt as neg.  No records currently available.  Has had several episodes of bright red blood in toilet after BM . Taking advil round the clock last 2 weeks for pain in suprapubic region.  Was seen by GI yesterday. Told she has hemorrhoids . Set up for CT abd and pelvis next week.  No fever, n/v/d. No constipation or hard stools. No straining.  UA today w/ sm blood otherwise neg.  Has been referred to Urology w/ ov on 1/20  rec Protocol HC cream after bowel movements.  Vicodin 1/2-1 every 6 hrs as needed for pain.   Begin Prilosec 20mg  daily for 2 weeks  Gas x As needed  Bloating  Pyridium 100mg  Three times a day  As needed  Urinary burning.  Do not use NSAIDS, advil, ibuprofen, aleve like meds CT abd done in [place] > reported neg    Saw GI > rec maint rx, did not take it "because listed may cause depression"   07/20/2013 acute ov/Mariah Everett re abd pain no longer using citrucel maint as prev rec  Chief Complaint  Patient presents with  . Acute Visit    Pt c/o abd pain and "grumbling" x 3 wks. She also c/o fatigue since her symptoms started.   pain is more epigastic but really diffuse, bilateral, episodic,  better p gaviscon,  Better immediately p eating, stopped daily  routine about Aug 1 when stopped working.no change lying down but sleeps ok. No problems with exercise, no change in bms n or v.   No obvious daytime variabilty or assoc chronic cough or cp or chest tightness, subjective wheeze overt sinus or hb symptoms. No unusual exp hx    Sleeping ok without nocturnal  or early am exacerbation  of respiratory  c/o's or need for noct saba. Also denies any obvious fluctuation of symptoms with weather or environmental changes or other aggravating or alleviating factors except as outlined above  Current Medications, Allergies, Past Medical History, Past Surgical History, Family History, and Social History were reviewed in [employer] record.  ROS  The following are not active complaints unless bolded sore throat, dysphagia, dental problems, itching, sneezing,  nasal congestion or excess/ purulent secretions, ear ache,   fever, chills, sweats, unintended wt loss, pleuritic or exertional cp, hemoptysis,  orthopnea pnd or leg swelling, presyncope, palpitations, heartburn, abdominal pain, anorexia, nausea, vomiting, diarrhea  or change in bowel or urinary habits, change in stools or urine, dysuria,hematuria,  rash, arthralgias, visual complaints, headache, numbness weakness or ataxia  or problems with walking or coordination,  change in mood/affect or memory.      Past Medical History:  Health Maintenance  - CPX 09/16/2012   - DT December 15, 2008  - DEXA 12/22/2008 T spine + .9 LFem -1.2, R Fem -.7  - GYN Tavon Neck/shoulder pain radicular C7/8 right  - MRI 12/22/2008 Pos C7 disc protrusion > Discectomy and fusion [facility]/18/2009 [facility] [place]  Thyroid nodule  - MRI 12/22/08  - U/S and bx May 11, 2009 > c/w benign goiter, TSH nl so no rx  Relative chronic Neutropenia  - dx  2010  Rectal bleeeding...................................Marland Kitchen  Dr Particia Nearing [name] - nl colonoscopy 08/2011     Family History:  Mother died from ovarian cancer  Father's family with throat cancer (smokers)  Lupus sister  Negative for respiratory diseases  Allergies brother    Social History:  Never smoker  Married  works in [employer] for [employer]                     Objective:   Physical Exam     Wt Readings from Last 3 Encounters:  07/20/13 135 lb 6.4 oz (61.417 kg)  12/10/12 135 lb 12.8 oz (61.598 kg)  09/16/12 134 lb (60.782 kg)    Ambulatory healthy appearing in no acute distress.  HEENT: nl dentition, turbinates, and orophanx  Neck without JVD/Nodes/TM/ no pain on flex/ext. No palp thyroid nodules  Lungs clear to A and P bilaterally without cough on insp or exp maneuvers  RRR no s3 or murmur or increase in P2  Nl pulses Abd not distended, no tenderness , BS +  No bruits or organomegaly , neg CVA tenderness   Ext warm without calf tenderness, cyanosis clubbing or edema MS nl gait no deformities/ restrictions Neuro nl sensorium, no motor or cerebellar def, no pathologic reflexes Skin: no rash, no lesions     Assessment & Plan:

## 2013-07-20 NOTE — Patient Instructions (Addendum)
Resume citrucel one heaping twice daily with glass of water  No boiled eggs, no undercooked veggies,  No beans  Gaviscon as needed  Let your GI doctor know if you're not doing better.

## 2013-07-20 NOTE — Assessment & Plan Note (Signed)
Recurrent, assoc with gas pains and gurgling, better p gaviscon with neg GI w/u already  rec maint citrucel one tsp bid and diet, reviewed > f/u GI

## 2013-09-22 ENCOUNTER — Ambulatory Visit (INDEPENDENT_AMBULATORY_CARE_PROVIDER_SITE_OTHER): Payer: BC Managed Care – PPO

## 2013-09-22 DIAGNOSIS — Z23 Encounter for immunization: Secondary | ICD-10-CM

## 2013-09-26 ENCOUNTER — Telehealth: Payer: Self-pay | Admitting: Internal Medicine

## 2013-09-26 NOTE — Telephone Encounter (Signed)
Called Patient to set up follow up apt, Left message x3. No return call back. Sent letter 09/26/13  ° °

## 2013-11-03 ENCOUNTER — Encounter (INDEPENDENT_AMBULATORY_CARE_PROVIDER_SITE_OTHER): Payer: Self-pay

## 2013-11-03 ENCOUNTER — Ambulatory Visit (INDEPENDENT_AMBULATORY_CARE_PROVIDER_SITE_OTHER): Payer: BC Managed Care – PPO | Admitting: Internal Medicine

## 2013-11-03 ENCOUNTER — Encounter: Payer: Self-pay | Admitting: Internal Medicine

## 2013-11-03 ENCOUNTER — Other Ambulatory Visit (INDEPENDENT_AMBULATORY_CARE_PROVIDER_SITE_OTHER): Payer: BC Managed Care – PPO

## 2013-11-03 VITALS — BP 118/86 | HR 86 | Temp 98.1°F | Ht 64.75 in | Wt 132.4 lb

## 2013-11-03 DIAGNOSIS — Z0000 Encounter for general adult medical examination without abnormal findings: Secondary | ICD-10-CM | POA: Insufficient documentation

## 2013-11-03 DIAGNOSIS — I493 Ventricular premature depolarization: Secondary | ICD-10-CM

## 2013-11-03 DIAGNOSIS — R109 Unspecified abdominal pain: Secondary | ICD-10-CM

## 2013-11-03 DIAGNOSIS — I4949 Other premature depolarization: Secondary | ICD-10-CM

## 2013-11-03 DIAGNOSIS — D708 Other neutropenia: Secondary | ICD-10-CM

## 2013-11-03 DIAGNOSIS — E785 Hyperlipidemia, unspecified: Secondary | ICD-10-CM

## 2013-11-03 HISTORY — DX: Encounter for general adult medical examination without abnormal findings: Z00.00

## 2013-11-03 LAB — LIPID PANEL
Cholesterol: 208 mg/dL — ABNORMAL HIGH (ref 0–200)
HDL: 87.4 mg/dL (ref >39.00)
Total CHOL/HDL Ratio: 2
Triglycerides: 40 mg/dL (ref 0.0–149.0)
VLDL: 8 mg/dL (ref 0.0–40.0)

## 2013-11-03 LAB — CBC WITH DIFFERENTIAL/PLATELET
Basophils Absolute: 0 10*3/uL (ref 0.0–0.1)
Basophils Relative: 0.4 % (ref 0.0–3.0)
Eosinophils Absolute: 0 10*3/uL (ref 0.0–0.7)
Hemoglobin: 15 g/dL (ref 12.0–15.0)
Lymphocytes Relative: 67.8 % — ABNORMAL HIGH (ref 12.0–46.0)
MCHC: 33.5 g/dL (ref 30.0–36.0)
MCV: 89.9 fl (ref 78.0–100.0)
Monocytes Absolute: 0.2 10*3/uL (ref 0.1–1.0)
Monocytes Relative: 6.7 % (ref 3.0–12.0)
Neutro Abs: 0.6 10*3/uL — ABNORMAL LOW (ref 1.4–7.7)
Neutrophils Relative %: 24.1 % — ABNORMAL LOW (ref 43.0–77.0)
Platelets: 177 10*3/uL (ref 150.0–400.0)
RDW: 13.4 % (ref 11.5–14.6)
WBC: 2.6 10*3/uL — ABNORMAL LOW (ref 4.5–10.5)

## 2013-11-03 LAB — URINALYSIS, ROUTINE W REFLEX MICROSCOPIC
Bilirubin Urine: NEGATIVE
Hgb urine dipstick: NEGATIVE
Ketones, ur: NEGATIVE
Leukocytes, UA: NEGATIVE
Urobilinogen, UA: 0.2 (ref 0.0–1.0)
pH: 5.5 (ref 5.0–8.0)

## 2013-11-03 LAB — HEPATIC FUNCTION PANEL
Alkaline Phosphatase: 41 U/L (ref 39–117)
Bilirubin, Direct: 0.1 mg/dL (ref 0.0–0.3)
Total Bilirubin: 0.9 mg/dL (ref 0.3–1.2)
Total Protein: 7.7 g/dL (ref 6.0–8.3)

## 2013-11-03 LAB — BASIC METABOLIC PANEL
BUN: 7 mg/dL (ref 6–23)
CO2: 30 mEq/L (ref 19–32)
Calcium: 9.7 mg/dL (ref 8.4–10.5)
Chloride: 106 mEq/L (ref 96–112)
Creatinine, Ser: 0.6 mg/dL (ref 0.4–1.2)
GFR: 124.03 mL/min (ref >60.00)

## 2013-11-03 LAB — LDL CHOLESTEROL, DIRECT: Direct LDL: 112.3 mg/dL

## 2013-11-03 NOTE — Progress Notes (Signed)
Quick Note:  LMTCB ______ 

## 2013-11-03 NOTE — Progress Notes (Signed)
Subjective:    Patient ID: Mariah Everett, female    DOB: 03/01/1956  MRN: [id number]    Brief patient profile:  49 yobf smoked only in HS with new cough since 2007 indolent onset assoc with sensation of pnds more day than night minimally productive esp in am, assoc with occ HB.  >>> resolved on ppi and decongestants.        History of Present Illness    12/10/12 Acute OV  Presents for persistent lower abdominal pain and supra pubic pressure and dysuria  Complains of  dysuria, burning, increased frequency x2 weeks    Does have some flank pain w/ radiation to sides. Dull ache at times w/ no waves of pain  Main issue is she has suprapubic pain and pressures w/ urinary urgency and then severe burning w/ urination . Azo not helping.  Has seen GYN x 2 . Given septra initially, symptoms improved but returned when off abx. Rx Cipro w/ improvement. Off abx symptoms returned. Cx by GYN reported to pt as neg.  No records currently available.  Has had several episodes of bright red blood in toilet after BM . Taking advil round the clock last 2 weeks for pain in suprapubic region.  Was seen by GI yesterday. Told she has hemorrhoids . Set up for CT abd and pelvis next week.  No fever, n/v/d. No constipation or hard stools. No straining.  UA today w/ sm blood otherwise neg.  Has been referred to Urology w/ ov on 1/20  rec Protocol HC cream after bowel movements.  Vicodin 1/2-1 every 6 hrs as needed for pain.  Begin Prilosec 20mg  daily for 2 weeks  Gas x As needed  Bloating  Pyridium 100mg  Three times a day  As needed  Urinary burning.  Do not use NSAIDS, advil, ibuprofen, aleve like meds CT abd done in [place] > reported neg    Saw GI > rec maint rx, did not take it "because listed may cause depression"   07/20/2013 acute ov/Wert re abd pain no longer using citrucel maint as prev rec  Chief Complaint  Patient presents with  . Acute Visit    Pt c/o abd pain and "grumbling" x 3 wks.  She also c/o fatigue since her symptoms started.   pain is more epigastic but really diffuse, bilateral, episodic,  better p gaviscon,  Better immediately p eating, stopped daily  routine about Aug 1 when stopped working.no change pain lying down but doesn't wake her > sleeps ok. No problems with exercise, no change in bms n or v.  rec Resume citrucel one heaping twice daily with glass of water No boiled eggs, no undercooked veggies,  No beans Gaviscon as needed  11/03/2013 f/u ov/Wert re: cpx/ abd pain resolved, not taking citrucel consistently  Chief Complaint  Patient presents with  . Annual Exam    Pt fasting. She c/o spot on her left arm she started to notice day after Thanksgiving 2014.     Never itched, was red but turned lighter with creams from [facility]  No  Sob or  cough or cp or chest tightness, subjective wheeze overt sinus or hb symptoms. No unusual exp hx    Sleeping ok without nocturnal  or early am exacerbation  of respiratory  c/o's or need for noct saba. Also denies any obvious fluctuation of symptoms with weather or environmental changes or other aggravating or alleviating factors except as outlined above   Current Medications, Allergies, Past  Medical History, Past Surgical History, Family History, and Social History were reviewed in [employer] record.  ROS  The following are not active complaints unless bolded sore throat, dysphagia, dental problems, itching, sneezing,  nasal congestion or excess/ purulent secretions, ear ache,   fever, chills, sweats, unintended wt loss, pleuritic or exertional cp, hemoptysis,  orthopnea pnd or leg swelling, presyncope, palpitations, heartburn, abdominal pain, anorexia, nausea, vomiting, diarrhea  or change in bowel or urinary habits, change in stools or urine, dysuria,hematuria,  rash, arthralgias, visual complaints, headache, numbness weakness or ataxia or problems with walking or coordination,  change in  mood/affect or memory.      Past Medical History:  Health Maintenance  - CPX 11/03/2013    - DT December 15, 2008  - DEXA 12/22/2008 T spine + .9 LFem -1.2, R Fem -.7  - GYN Tavon Neck/shoulder pain radicular C7/8 right  - MRI 12/22/2008 Pos C7 disc protrusion > Discectomy and fusion 05/18/2009 [facility] in [place]  Thyroid nodule  - MRI 12/22/08  - U/S and bx May 11, 2009 > c/w benign goiter, TSH nl so no rx  Dermatits ?eczema ..............................  Scales/ [place] Relative chronic Neutropenia  - dx  2010  Rectal bleeeding...................................Marland Kitchen  Dr Particia Nearing [name] - nl colonoscopy 08/2011     Family History:  Mother died from ovarian cancer  Father's family with throat cancer (smokers)  Lupus sister  Negative for respiratory diseases  Allergies brother    Social History:  Never smoker  Married  retired from [employer]                    Objective:   Physical Exam    11/03/2013      132  Wt Readings from Last 3 Encounters:  07/20/13 135 lb 6.4 oz (61.417 kg)  12/10/12 135 lb 12.8 oz (61.598 kg)  09/16/12 134 lb (60.782 kg)    Ambulatory healthy appearing  Bf  in no acute distress.  HEENT: nl dentition, turbinates, and orophanx  Neck without JVD/Nodes/TM/ no pain on flex/ext. No palp thyroid nodules  Lungs clear to A and P bilaterally without cough on insp or exp maneuvers  RRR no s3 or murmur or increase in P2  Nl pulses Abd not distended, no tenderness , BS +  No bruits or organomegaly , neg CVA tenderness   Ext warm without calf tenderness, cyanosis clubbing or edema MS nl gait no deformities/ restrictions Neuro nl sensorium, no motor or cerebellar def, no pathologic reflexes Skin: silver dollar sized hypopigmented sharp margin macular lesion L upper arm     Assessment & Plan:

## 2013-11-03 NOTE — Assessment & Plan Note (Signed)
ekg nsr, min nonspecific t w changes > no rx needed

## 2013-11-03 NOTE — Assessment & Plan Note (Signed)
Lab Results  Component Value Date   CHOL 208* 11/03/2013   HDL 87.40 11/03/2013   LDLCALC 102* 09/16/2012   LDLDIRECT 112.3 11/03/2013   TRIG 40.0 11/03/2013   CHOLHDL 2 11/03/2013   Adequate control on present rx, reviewed > no change in rx needed  (diet/ ex)

## 2013-11-03 NOTE — Patient Instructions (Addendum)
Please remember to go to the lab   department downstairs for your tests - we will call you with the results when they are available.  Continue citrucel daily   Please schedule a follow up visit in 12 months but call sooner if needed for CPX

## 2013-11-03 NOTE — Assessment & Plan Note (Addendum)
Lab Results  Component Value Date   WBC 2.6* 11/03/2013     - baseline 2.3 - 3 k wbc's with at least 25% Polys, so no change in baseline

## 2013-11-03 NOTE — Assessment & Plan Note (Signed)
Resolved but advised her to be more consistent with citrucel to prevent recurrence and unnecessary repeat w/u's

## 2013-11-04 ENCOUNTER — Telehealth: Payer: Self-pay | Admitting: Internal Medicine

## 2013-11-04 NOTE — Progress Notes (Signed)
Quick Note:  Spoke with pt and notified of results per Dr. Wert. Pt verbalized understanding and denied any questions.  ______ 

## 2013-11-04 NOTE — Telephone Encounter (Signed)
Per lab result not pt spoke with Verlon Au earlier this am regarding lab results.  Will close note

## 2014-05-19 ENCOUNTER — Emergency Department (HOSPITAL_BASED_OUTPATIENT_CLINIC_OR_DEPARTMENT_OTHER): Payer: BC Managed Care – PPO

## 2014-05-19 ENCOUNTER — Telehealth: Payer: Self-pay | Admitting: Internal Medicine

## 2014-05-19 ENCOUNTER — Encounter (HOSPITAL_BASED_OUTPATIENT_CLINIC_OR_DEPARTMENT_OTHER): Payer: Self-pay | Admitting: Emergency Medicine

## 2014-05-19 ENCOUNTER — Encounter (HOSPITAL_COMMUNITY)
Admission: EM | Disposition: A | Discharge status: Home / Self Care | Payer: Self-pay | Source: Home / Self Care | Attending: Internal Medicine

## 2014-05-19 ENCOUNTER — Observation Stay (HOSPITAL_BASED_OUTPATIENT_CLINIC_OR_DEPARTMENT_OTHER)
Admission: EM | Admit: 2014-05-19 | Discharge: 2014-05-20 | Disposition: A | Discharge status: Home / Self Care | Payer: BC Managed Care – PPO | Attending: Internal Medicine | Admitting: Internal Medicine

## 2014-05-19 DIAGNOSIS — I200 Unstable angina: Secondary | ICD-10-CM

## 2014-05-19 DIAGNOSIS — D709 Neutropenia, unspecified: Secondary | ICD-10-CM | POA: Insufficient documentation

## 2014-05-19 DIAGNOSIS — R079 Chest pain, unspecified: Principal | ICD-10-CM | POA: Insufficient documentation

## 2014-05-19 DIAGNOSIS — Z981 Arthrodesis status: Secondary | ICD-10-CM | POA: Insufficient documentation

## 2014-05-19 HISTORY — PX: LEFT HEART CATHETERIZATION WITH CORONARY ANGIOGRAM: SHX5451

## 2014-05-19 LAB — CBC WITH DIFFERENTIAL/PLATELET
Basophils Absolute: 0 10*3/uL (ref 0.0–0.1)
Basophils Relative: 0 % (ref 0–1)
EOS ABS: 0 10*3/uL (ref 0.0–0.7)
EOS PCT: 1 % (ref 0–5)
HCT: 42.4 % (ref 36.0–46.0)
Hemoglobin: 14.4 g/dL (ref 12.0–15.0)
LYMPHS ABS: 2 10*3/uL (ref 0.7–4.0)
Lymphocytes Relative: 72 % — ABNORMAL HIGH (ref 12–46)
MCH: 30.7 pg (ref 26.0–34.0)
MCHC: 34 g/dL (ref 30.0–36.0)
MCV: 90.4 fL (ref 78.0–100.0)
MONO ABS: 0.2 10*3/uL (ref 0.1–1.0)
Monocytes Relative: 7 % (ref 3–12)
NEUTROS PCT: 20 % — AB (ref 43–77)
Neutro Abs: 0.5 10*3/uL — ABNORMAL LOW (ref 1.7–7.7)
Platelets: 169 10*3/uL (ref 150–400)
RBC: 4.69 MIL/uL (ref 3.87–5.11)
RDW: 12.3 % (ref 11.5–15.5)
WBC: 2.7 10*3/uL — ABNORMAL LOW (ref 4.0–10.5)

## 2014-05-19 LAB — COMPREHENSIVE METABOLIC PANEL
ALT: 15 U/L (ref 0–35)
AST: 19 U/L (ref 0–37)
Albumin: 4.6 g/dL (ref 3.5–5.2)
Alkaline Phosphatase: 44 U/L (ref 39–117)
BILIRUBIN TOTAL: 0.9 mg/dL (ref 0.3–1.2)
BUN: 6 mg/dL (ref 6–23)
CALCIUM: 10.4 mg/dL (ref 8.4–10.5)
CHLORIDE: 103 meq/L (ref 96–112)
CO2: 27 mEq/L (ref 19–32)
Creatinine, Ser: 0.6 mg/dL (ref 0.50–1.10)
GFR calc Af Amer: >90 mL/min (ref >90)
Glucose, Bld: 144 mg/dL — ABNORMAL HIGH (ref 70–99)
Potassium: 4 mEq/L (ref 3.7–5.3)
Sodium: 142 mEq/L (ref 137–147)
Total Protein: 7.8 g/dL (ref 6.0–8.3)

## 2014-05-19 LAB — CBC
HCT: 38.6 % (ref 36.0–46.0)
Hemoglobin: 12.5 g/dL (ref 12.0–15.0)
MCH: 29.8 pg (ref 26.0–34.0)
MCHC: 32.4 g/dL (ref 30.0–36.0)
MCV: 92.1 fL (ref 78.0–100.0)
Platelets: 169 10*3/uL (ref 150–400)
RBC: 4.19 MIL/uL (ref 3.87–5.11)
RDW: 12.5 % (ref 11.5–15.5)
WBC: 3.1 10*3/uL — ABNORMAL LOW (ref 4.0–10.5)

## 2014-05-19 LAB — TSH: TSH: 1.21 u[IU]/mL (ref 0.350–4.500)

## 2014-05-19 LAB — PROTIME-INR
INR: 1.03 (ref 0.00–1.49)
Prothrombin Time: 13.5 seconds (ref 11.6–15.2)

## 2014-05-19 LAB — CREATININE, SERUM
CREATININE: 0.55 mg/dL (ref 0.50–1.10)
GFR calc Af Amer: >90 mL/min (ref >90)
GFR calc non Af Amer: >90 mL/min (ref >90)

## 2014-05-19 LAB — LIPASE, BLOOD: LIPASE: 48 U/L (ref 11–59)

## 2014-05-19 LAB — MAGNESIUM: Magnesium: 1.7 mg/dL (ref 1.5–2.5)

## 2014-05-19 LAB — D-DIMER, QUANTITATIVE: D-Dimer, Quant: <0.27 ug/mL-FEU (ref 0.00–0.48)

## 2014-05-19 LAB — GLUCOSE, CAPILLARY: GLUCOSE-CAPILLARY: 128 mg/dL — AB (ref 70–99)

## 2014-05-19 LAB — PRO B NATRIURETIC PEPTIDE: Pro B Natriuretic peptide (BNP): 57.3 pg/mL (ref 0–125)

## 2014-05-19 LAB — TROPONIN I: Troponin I: <0.3 ng/mL (ref <0.30)

## 2014-05-19 SURGERY — LEFT HEART CATHETERIZATION WITH CORONARY ANGIOGRAM
Anesthesia: LOCAL

## 2014-05-19 MED ORDER — LIDOCAINE HCL (PF) 1 % IJ SOLN
INTRAMUSCULAR | Status: AC
  Filled 2014-05-19: qty 30

## 2014-05-19 MED ORDER — FENTANYL CITRATE 0.05 MG/ML IJ SOLN
INTRAMUSCULAR | Status: AC
  Filled 2014-05-19: qty 2

## 2014-05-19 MED ORDER — ENOXAPARIN SODIUM 40 MG/0.4ML ~~LOC~~ SOLN: 40.0000 mg | SUBCUTANEOUS | Status: DC

## 2014-05-19 MED ORDER — HEPARIN (PORCINE) IN NACL 2-0.9 UNIT/ML-% IJ SOLN
INTRAMUSCULAR | Status: AC
  Filled 2014-05-19: qty 1000

## 2014-05-19 MED ORDER — HEPARIN SODIUM (PORCINE) 1000 UNIT/ML IJ SOLN
INTRAMUSCULAR | Status: AC
  Filled 2014-05-19: qty 1

## 2014-05-19 MED ORDER — ONDANSETRON HCL 4 MG/2ML IJ SOLN: 4.0000 mg | Freq: Four times a day (QID) | INTRAMUSCULAR | Status: DC | PRN

## 2014-05-19 MED ORDER — HEPARIN SODIUM (PORCINE) 5000 UNIT/ML IJ SOLN
5000.0000 [IU] | Freq: Three times a day (TID) | INTRAMUSCULAR | Status: DC
  Filled 2014-05-19 (×5): qty 1

## 2014-05-19 MED ORDER — ASPIRIN EC 81 MG PO TBEC
81.0000 mg | DELAYED_RELEASE_TABLET | Freq: Every day | ORAL | Status: DC
  Administered 2014-05-20: 81 mg via ORAL
  Filled 2014-05-19: qty 1

## 2014-05-19 MED ORDER — ONDANSETRON HCL 4 MG/2ML IJ SOLN
INTRAMUSCULAR | Status: AC
Start: 2014-05-19 — End: 2014-05-19
  Administered 2014-05-19: 4 mg via INTRAVENOUS
  Filled 2014-05-19: qty 2

## 2014-05-19 MED ORDER — NITROGLYCERIN IN D5W 200-5 MCG/ML-% IV SOLN
5.0000 ug/min | INTRAVENOUS | Status: DC
  Administered 2014-05-19: 5 ug/min via INTRAVENOUS

## 2014-05-19 MED ORDER — NITROGLYCERIN 0.4 MG SL SUBL
0.4000 mg | SUBLINGUAL_TABLET | SUBLINGUAL | Status: DC | PRN
  Administered 2014-05-19 (×4): 0.4 mg via SUBLINGUAL
  Filled 2014-05-19: qty 1

## 2014-05-19 MED ORDER — ACETAMINOPHEN 325 MG PO TABS: 650.0000 mg | ORAL_TABLET | Freq: Once | ORAL | Status: DC

## 2014-05-19 MED ORDER — ACETAMINOPHEN 325 MG PO TABS: 650.0000 mg | ORAL_TABLET | ORAL | Status: DC | PRN

## 2014-05-19 MED ORDER — NITROGLYCERIN 0.4 MG SL SUBL
SUBLINGUAL_TABLET | SUBLINGUAL | Status: AC
  Filled 2014-05-19: qty 1

## 2014-05-19 MED ORDER — ASPIRIN 81 MG PO CHEW
324.0000 mg | CHEWABLE_TABLET | Freq: Once | ORAL | Status: AC
  Administered 2014-05-19: 324 mg via ORAL
  Filled 2014-05-19: qty 4

## 2014-05-19 MED ORDER — NITROGLYCERIN IN D5W 200-5 MCG/ML-% IV SOLN
INTRAVENOUS | Status: AC
  Filled 2014-05-19: qty 250

## 2014-05-19 MED ORDER — SODIUM CHLORIDE 0.9 % IV SOLN
INTRAVENOUS | Status: DC
  Administered 2014-05-19: 13:00:00 via INTRAVENOUS

## 2014-05-19 MED ORDER — SODIUM CHLORIDE 0.9 % IJ SOLN: 3.0000 mL | INTRAMUSCULAR | Status: DC | PRN

## 2014-05-19 MED ORDER — MIDAZOLAM HCL 2 MG/2ML IJ SOLN
INTRAMUSCULAR | Status: AC
  Filled 2014-05-19: qty 2

## 2014-05-19 MED ORDER — SODIUM CHLORIDE 0.9 % IV SOLN: 250.0000 mL | INTRAVENOUS | Status: DC | PRN

## 2014-05-19 MED ORDER — VITAMIN D3 25 MCG (1000 UNIT) PO TABS
1000.0000 [IU] | ORAL_TABLET | Freq: Every day | ORAL | Status: DC
  Administered 2014-05-20: 1000 [IU] via ORAL
  Filled 2014-05-19: qty 1

## 2014-05-19 MED ORDER — SODIUM CHLORIDE 0.9 % IJ SOLN
3.0000 mL | Freq: Two times a day (BID) | INTRAMUSCULAR | Status: DC
  Administered 2014-05-19 – 2014-05-20 (×2): 3 mL via INTRAVENOUS

## 2014-05-19 MED ORDER — ONDANSETRON HCL 4 MG/2ML IJ SOLN
4.0000 mg | Freq: Once | INTRAMUSCULAR | Status: AC
  Administered 2014-05-19: 4 mg via INTRAVENOUS

## 2014-05-19 MED ORDER — SODIUM CHLORIDE 0.9 % IV SOLN
INTRAVENOUS | Status: DC
  Administered 2014-05-19: 1000 mL via INTRAVENOUS
  Administered 2014-05-20: 07:00:00 via INTRAVENOUS

## 2014-05-19 MED ORDER — NITROGLYCERIN 2 % TD OINT: 1.0000 [in_us] | TOPICAL_OINTMENT | Freq: Once | TRANSDERMAL | Status: DC

## 2014-05-19 MED ORDER — NITROGLYCERIN 0.4 MG SL SUBL: 0.4000 mg | SUBLINGUAL_TABLET | SUBLINGUAL | Status: DC | PRN

## 2014-05-19 MED ORDER — ASPIRIN 300 MG RE SUPP: 300.0000 mg | RECTAL | Status: DC

## 2014-05-19 MED ORDER — ASPIRIN 81 MG PO CHEW: 324.0000 mg | CHEWABLE_TABLET | ORAL | Status: DC

## 2014-05-19 MED ORDER — NITROGLYCERIN 0.2 MG/ML ON CALL CATH LAB
INTRAVENOUS | Status: AC
  Filled 2014-05-19: qty 1

## 2014-05-19 MED ORDER — VERAPAMIL HCL 2.5 MG/ML IV SOLN
INTRAVENOUS | Status: AC
  Filled 2014-05-19: qty 2

## 2014-05-19 NOTE — CV Procedure (Signed)
Cardiac Cath Procedure Note:  Indication: [place]  Procedures performed:  1) Selective coronary angiography 2) Left heart catheterization 3) Left ventriculogram  Description of procedure:   The risks and indication of the procedure were explained. Consent was signed and placed on the chart. An appropriate timeout was taken prior to the procedure. After a normal Allen's test was confirmed, the right wrist was prepped and draped in the routine sterile fashion and anesthetized with 1% local lidocaine.   A 5 FR arterial sheath was then placed in the right radial artery using a modified Seldinger technique. Systemic heparin was administered. 3mg  IV verapamil was given through the sheath. Standard catheters including a JL 3.5, JR4 and straight pigtail were used. All catheter exchanges were made over a wire.  Complications:  None apparent  Findings:  Ao Pressure: 120/80 (99) LV Pressure:  117/10/13 There was no signficant gradient across the aortic valve on pullback.  Left main: Normal. Trifurcates into a small LAD, very large ramus branch and LCX. Normal  LAD: Normal. Small to moderate sized vessel feeding mostly the septal territory.  RAMUS: Very large vessel feeding most of LAD territory. Normal  LCX: Small OM-1. Large OM-2. Normal.   RCA: Very large dominant vessel. Normal.  LV-gram done in the RAO projection: Ejection fraction = 60% Normal wall motion.   Assessment: 1. Normal coronary arteries 2. Normal LV function  Plan/Discussion:  I question if symptoms are more related to an arrhythmia but none seen on monitor with CP in ER. Will observe overnight. Check D-Dimer and echo. Likely home in am.   Glori Bickers MD 5:39 PM

## 2014-05-19 NOTE — ED Notes (Signed)
Py c/o intermittent chest pressure and fluttering feeling in her chest for 3-4 months. Current episode started 5 days ago and is accompanied by nausea.

## 2014-05-19 NOTE — ED Notes (Signed)
Attempt to call report x 2 , nurse not assigned not able to given report

## 2014-05-19 NOTE — Telephone Encounter (Signed)
Spoke with the pt  She c/o chest tightness and pressure  She states that this was bothering her minimally at the last visit and she has been taking pepcid since and symptoms are only getting worse  She had "feeling of anxiety and heart racing" last night  I advised that she needs to be evaluated and unfortunately with have no openings today with limited providers in the office  I advised go to UC- the one at [facility] if she can  She verbalized understanding  Will forward to MW as Juluis Rainier

## 2014-05-19 NOTE — ED Notes (Signed)
Pt reports CP 4/10 when care link arrived new order for nitro gtt and nitro sl x 1 , bed assignment change to

## 2014-05-19 NOTE — ED Notes (Signed)
Pt ambulated to bathroom 

## 2014-05-19 NOTE — ED Provider Notes (Addendum)
CSN: [id number]     Arrival date & time 05/19/14  1157 History   First MD Initiated Contact with Patient 05/19/14 1205     Chief Complaint  Patient presents with  . Chest Pain     (Consider location/radiation/quality/duration/timing/severity/associated sxs/prior Treatment) HPI Comments: Patient presents to the ER for evaluation of chest discomfort. Patient reports that she has been having intermittent episodes of heaviness in her chest and fluttering in the chest for approximately a week. She reports intermittent episodes of feeling of fluttering in the chest, but the heaviness has been more persistent. Symptoms of chest heaviness worsened yesterday and has been continuous. She has not identified any alleviating or exacerbating factors.  Patient does indicate that she has had episodes of fluttering in the chest intermittently for several months. The symptoms she is experiencing currently are different and worse. She has had Fatigue for the last few days which he has not been able to explain.  Patient is a 58 y.o. female presenting with chest pain.  Chest Pain   Past Medical History  Diagnosis Date  . Neck pain     radicular C7/8 right. MRI 12-22-18 pos C7 disc protrusion  . Shoulder pain   . Thyroid nodule     a. Bx/TFTs normal by prior workup, clinically benign.  . Chronic benign neutropenia    Past Surgical History  Procedure Laterality Date  . Cervical discectomy  05-18-2009  . Cervical fusion  05-18-2009  . Biopsy thyroid     Family History  Problem Relation Age of Onset  . Ovarian cancer Mother   . Throat cancer Father   . Lupus Sister    History  Substance Use Topics  . Smoking status: Never Smoker   . Smokeless tobacco: Not on file  . Alcohol Use: Not on file   OB History   Grav Para Term Preterm Abortions TAB SAB Ect Mult Living                 Review of Systems  Cardiovascular: Positive for chest pain.      Allergies  Review of patient's allergies  indicates no known allergies.  Home Medications   Prior to Admission medications   Medication Sig Start Date End Date Taking? Authorizing Provider  fexofenadine (ALLEGRA) 180 MG tablet Take 180 mg by mouth daily as needed for allergies or rhinitis.   Yes Historical Provider, MD  hydrocortisone (ANUSOL-HC) 25 MG suppository Place 25 mg rectally 2 (two) times daily as needed for hemorrhoids or itching.   Yes Historical Provider, MD   BP 139/93  Pulse 72  Temp(Src) 98.4 F (36.9 C) (Oral)  Resp 20  Wt 131 lb (59.421 kg)  SpO2 100% Physical Exam  Constitutional: She is oriented to person, place, and time. She appears well-developed and well-nourished. No distress.  HENT:  Head: Normocephalic and atraumatic.  Right Ear: Hearing normal.  Left Ear: Hearing normal.  Nose: Nose normal.  Mouth/Throat: Oropharynx is clear and moist and mucous membranes are normal.  Eyes: Conjunctivae and EOM are normal. Pupils are equal, round, and reactive to light.  Neck: Normal range of motion. Neck supple.  Cardiovascular: Regular rhythm, S1 normal and S2 normal.  Exam reveals no gallop and no friction rub.   No murmur heard. Pulmonary/Chest: Effort normal and breath sounds normal. No respiratory distress. She exhibits no tenderness.  Abdominal: Soft. Normal appearance and bowel sounds are normal. There is no hepatosplenomegaly. There is no tenderness. There is no rebound, no guarding,  no tenderness at McBurney's point and negative Murphy's sign. No hernia.  Musculoskeletal: Normal range of motion.  Neurological: She is alert and oriented to person, place, and time. She has normal strength. No cranial nerve deficit or sensory deficit. Coordination normal. GCS eye subscore is 4. GCS verbal subscore is 5. GCS motor subscore is 6.  Skin: Skin is warm, dry and intact. No rash noted. No cyanosis.  Psychiatric: She has a normal mood and affect. Her speech is normal and behavior is normal. Thought content normal.     ED Course  Procedures (including critical care time) Labs Review Labs Reviewed  CBC WITH DIFFERENTIAL - Abnormal; Notable for the following:    WBC 2.7 (*)    Neutrophils Relative % 20 (*)    Lymphocytes Relative 72 (*)    Neutro Abs 0.5 (*)    All other components within normal limits  COMPREHENSIVE METABOLIC PANEL - Abnormal; Notable for the following:    Glucose, Bld 144 (*)    All other components within normal limits  COMPREHENSIVE METABOLIC PANEL - Abnormal; Notable for the following:    Alkaline Phosphatase 35 (*)    All other components within normal limits  HEMOGLOBIN A1C - Abnormal; Notable for the following:    Hemoglobin A1C 5.7 (*)    Mean Plasma Glucose 117 (*)    All other components within normal limits  CBC - Abnormal; Notable for the following:    WBC 3.4 (*)    All other components within normal limits  CBC - Abnormal; Notable for the following:    WBC 3.1 (*)    All other components within normal limits  GLUCOSE, CAPILLARY - Abnormal; Notable for the following:    Glucose-Capillary 128 (*)    All other components within normal limits  TROPONIN I  PRO B NATRIURETIC PEPTIDE  LIPASE, BLOOD  PATHOLOGIST SMEAR REVIEW  PROTIME-INR  LIPID PANEL  TSH  TROPONIN I  TROPONIN I  TROPONIN I  MAGNESIUM  CREATININE, SERUM  D-DIMER, QUANTITATIVE    Imaging Review No results found.   EKG Interpretation   Date/Time:  Friday May 19 2014 12:03:36 EDT Ventricular Rate:  94 PR Interval:  154 QRS Duration: 74 QT Interval:  360 QTC Calculation: 450 R Axis:   74 Text Interpretation:  Normal sinus rhythm Normal ECG Confirmed by POLLINA   MD, CHRISTOPHER ([id number]) on 05/19/2014 12:05:43 PM      MDM   Final diagnoses:  Chest pain on exertion   chest pain  Patient presents to the ER for evaluation of fatigue, chest pressure and heart palpitations. The symptoms have been intermittent for the last several months, but this week have worsened. She is having  much more frequent episodes of fluttering and not the pressure has become constant. She does not have any history of heart disease. She is not a smoker. Heart Score for Major Cardiac Events is 1 (age 31-65). She was low risk, however she did have chest pressure upon arrival to the ER which resolved with nitroglycerin. She had an episode of recurrence of the pressure and palpitations when she got up and walked to the bathroom which got better after she sat back in the bed. This is consistent with exertional symptoms. Based on this, I did discuss the case with Doctor Bensimhon, on call for cardiology at [facility]. He has recommended that the patient be placed in the hospital overnight for observation, stress test in the morning. Patient is in agreement with this  plan. She was reevaluated, resting comfortably in the room without any chest pain.    Orpah Greek, MD 05/19/14 [place], MD 05/31/14 [phone]

## 2014-05-19 NOTE — H&P (Signed)
HPI:  58 y/o woman with no PMHx.   Has had intermittent CP since March. Over past week this has gotten more frequent and severe. Associated with nausea, SOB and palpitations. Occasional radiation to R jaw. No syncope or presyncope. Has felt very weak. Today developed severe stuttering CP. Went to [facility]. ECG and troponin were normal. CP relieved completely with NTG. Was being transferred for possible stress testing. Then developed severe, recurrent CP requiring NTG gtt. Brought to cath lab for urgent cath.   Review of Systems:     Cardiac Review of Systems: {Y] = yes [ ]  = no  Chest Pain [ y   ]  Resting SOB Blue.Reese   ] Exertional SOB  [  ]  Orthopnea [  ]   Pedal Edema [   ]    Palpitations Blue.Reese  ] Syncope  [  ]   Presyncope [   ]  General Review of Systems: [Y] = yes [  ]=no Constitional: recent weight change [  ]; anorexia [  ]; fatigue [  ]; nausea [ y ]; night sweats [  ]; fever [  ]; or chills [  ];                                                                                                                                          Dental: poor dentition[  ];   Eye : blurred vision [  ]; diplopia [   ]; vision changes [  ];  Amaurosis fugax[  ]; Resp: cough [  ];  wheezing[  ];  hemoptysis[  ]; shortness of breath[  ]y; paroxysmal nocturnal dyspnea[  ]; dyspnea on exertion[  ]; or orthopnea[  ];  GI:  gallstones[  ], vomiting[  ];  dysphagia[  ]; melena[  ];  hematochezia [  ]; heartburn[  ];   Hx of  Colonoscopy[  ]; GU: kidney stones [  ]; hematuria[  ];   dysuria [  ];  nocturia[  ];  history of     obstruction [  ];                 Skin: rash, swelling[  ];, hair loss[  ];  peripheral edema[  ];  or itching[  ]; Musculosketetal: myalgias[  ];  joint swelling[  ];  joint erythema[  ];  joint pain[  ];  back pain[  ];  Heme/Lymph: bruising[  ];  bleeding[  ];  anemia[  ];  Neuro: TIA[  ];  headaches[  ];  stroke[  ];  vertigo[  ];  seizures[  ];   paresthesias[  ];  difficulty  walking[  ];  Psych:depression[  ]; anxiety[  ];  Endocrine: diabetes[  ];  thyroid dysfunction[  ];  Other:  Past Medical History  Diagnosis Date  . Neck  pain     radicular C7/8 right. MRI 12-22-18 pos C7 disc protrusion  . Shoulder pain   . Thyroid nodule     a. Bx/TFTs normal by prior workup, clinically benign.  . Chronic benign neutropenia     Medications Prior to Admission  Medication Sig Dispense Refill  . cholecalciferol (VITAMIN D) 1000 UNITS tablet Take 1,000 Units by mouth daily.      . hydrocortisone (ANUSOL-HC) 2.5 % rectal cream Place 1 application rectally 2 (two) times daily. As needed      . pimecrolimus (ELIDEL) 1 % cream Apply 1 application topically 2 (two) times daily.      . Probiotic Product (PROBIOTIC DAILY PO) Take 1 capsule by mouth daily.         No Known Allergies  History   Social History  . Marital Status: Married    Spouse Name: N/A    Number of Children: N/A  . Years of Education: N/A   Occupational History  . insurance    Social History Main Topics  . Smoking status: Never Smoker   . Smokeless tobacco: Not on file  . Alcohol Use: Not on file  . Drug Use: Not on file  . Sexual Activity: Not on file   Other Topics Concern  . Not on file   Social History Narrative  . No narrative on file    Family History  Problem Relation Age of Onset  . Ovarian cancer Mother   . Throat cancer Father   . Lupus Sister     PHYSICAL EXAM: Filed Vitals:   05/19/14 1430  BP: 144/86  Pulse: 78  Temp:   Resp:    General:  Well appearing. No respiratory difficulty. Looks younger than stated age 29: normal Neck: supple. no JVD. Carotids 2+ bilat; no bruits. No lymphadenopathy or thryomegaly appreciated. Cor: PMI nondisplaced. Regular rate & rhythm. No rubs, gallops or murmurs. Lungs: clear Abdomen: soft, nontender, nondistended. No hepatosplenomegaly. No bruits or masses. Good bowel sounds. Extremities: no cyanosis, clubbing, rash,  edema Neuro: alert & oriented x 3, cranial nerves grossly intact. moves all 4 extremities w/o difficulty. Affect pleasant.  ECG: SR 94 No ST-T wave abnormalities.    Results for orders placed during the hospital encounter of 05/19/14 (from the past 24 hour(s))  CBC WITH DIFFERENTIAL     Status: Abnormal   Collection Time    05/19/14 12:10 PM      Result Value Ref Range   WBC 2.7 (*) 4.0 - 10.5 K/uL   RBC 4.69  3.87 - 5.11 MIL/uL   Hemoglobin 14.4  12.0 - 15.0 g/dL   HCT 42.4  36.0 - 46.0 %   MCV 90.4  78.0 - 100.0 fL   MCH 30.7  26.0 - 34.0 pg   MCHC 34.0  30.0 - 36.0 g/dL   RDW 12.3  11.5 - 15.5 %   Platelets 169  150 - 400 K/uL   Neutrophils Relative % 20 (*) 43 - 77 %   Lymphocytes Relative 72 (*) 12 - 46 %   Monocytes Relative 7  3 - 12 %   Eosinophils Relative 1  0 - 5 %   Basophils Relative 0  0 - 1 %   Neutro Abs 0.5 (*) 1.7 - 7.7 K/uL   Lymphs Abs 2.0  0.7 - 4.0 K/uL   Monocytes Absolute 0.2  0.1 - 1.0 K/uL   Eosinophils Absolute 0.0  0.0 - 0.7 K/uL   Basophils Absolute  0.0  0.0 - 0.1 K/uL   WBC Morphology ATYPICAL LYMPHOCYTES     Smear Review LARGE PLATELETS PRESENT    COMPREHENSIVE METABOLIC PANEL     Status: Abnormal   Collection Time    05/19/14 12:10 PM      Result Value Ref Range   Sodium 142  137 - 147 mEq/L   Potassium 4.0  3.7 - 5.3 mEq/L   Chloride 103  96 - 112 mEq/L   CO2 27  19 - 32 mEq/L   Glucose, Bld 144 (*) 70 - 99 mg/dL   BUN 6  6 - 23 mg/dL   Creatinine, Ser 0.60  0.50 - 1.10 mg/dL   Calcium 10.4  8.4 - 10.5 mg/dL   Total Protein 7.8  6.0 - 8.3 g/dL   Albumin 4.6  3.5 - 5.2 g/dL   AST 19  0 - 37 U/L   ALT 15  0 - 35 U/L   Alkaline Phosphatase 44  39 - 117 U/L   Total Bilirubin 0.9  0.3 - 1.2 mg/dL   GFR calc non Af Amer >90  >90 mL/min   GFR calc Af Amer >90  >90 mL/min  TROPONIN I     Status: None   Collection Time    05/19/14 12:10 PM      Result Value Ref Range   Troponin I <0.30  <0.30 ng/mL  PRO B NATRIURETIC PEPTIDE     Status:  None   Collection Time    05/19/14 12:10 PM      Result Value Ref Range   Pro B Natriuretic peptide (BNP) 57.3  0 - 125 pg/mL  LIPASE, BLOOD     Status: None   Collection Time    05/19/14 12:10 PM      Result Value Ref Range   Lipase 48  11 - 59 U/L   Dg Chest Port 1 View  05/19/2014   CLINICAL DATA:  Chest discomfort Chest discomfort  EXAM: PORTABLE CHEST - 1 VIEW  COMPARISON:  Two view chest dated 12/15/2008.  FINDINGS: The heart size and mediastinal contours are within normal limits. Both lungs are clear. The visualized skeletal structures are unremarkable.  IMPRESSION: No active disease.   Electronically Signed   By: Margaree Mackintosh M.D.   On: 05/19/2014 12:59     ASSESSMENT: 1) Unstable angina  PLAN/DISCUSSION:  She has no cardiac risk factors and ECG are CE are normal. However progressive CP and relief with NTG very concerning for [place]. Risk/indications of cath discussed with her and her husband. Will proceed with cath.   Benay Spice 4:43 PM

## 2014-05-19 NOTE — Interval H&P Note (Signed)
History and Physical Interval Note:  05/19/2014 4:44 PM  Mariah Everett  has presented today for surgery, with the diagnosis of cp  The various methods of treatment have been discussed with the patient and family. After consideration of risks, benefits and other options for treatment, the patient has consented to  Procedure(s): LEFT HEART CATHETERIZATION WITH CORONARY ANGIOGRAM (N/A) with possible angioplasty  Cath Lab Visit (complete for each Cath Lab visit)  Clinical Evaluation Leading to the Procedure:   ACS: Yes.    Non-ACS:    Anginal Classification: CCS IV  Anti-ischemic medical therapy: Minimal Therapy (1 class of medications)  Non-Invasive Test Results: No non-invasive testing performed  Prior CABG: No previous CABG      as a surgical intervention .  The patient's history has been reviewed, patient examined, no change in status, stable for surgery.  I have reviewed the patient's chart and labs.  Questions were answered to the patient's satisfaction.     Daniel Bensimhon

## 2014-05-20 DIAGNOSIS — R079 Chest pain, unspecified: Principal | ICD-10-CM

## 2014-05-20 LAB — COMPREHENSIVE METABOLIC PANEL
ALT: 10 U/L (ref 0–35)
AST: 14 U/L (ref 0–37)
Albumin: 3.6 g/dL (ref 3.5–5.2)
Alkaline Phosphatase: 35 U/L — ABNORMAL LOW (ref 39–117)
BILIRUBIN TOTAL: 0.7 mg/dL (ref 0.3–1.2)
BUN: 9 mg/dL (ref 6–23)
CHLORIDE: 107 meq/L (ref 96–112)
CO2: 25 meq/L (ref 19–32)
CREATININE: 0.64 mg/dL (ref 0.50–1.10)
Calcium: 8.7 mg/dL (ref 8.4–10.5)
GLUCOSE: 95 mg/dL (ref 70–99)
Potassium: 4 mEq/L (ref 3.7–5.3)
Sodium: 144 mEq/L (ref 137–147)
Total Protein: 6.2 g/dL (ref 6.0–8.3)

## 2014-05-20 LAB — CBC
HEMATOCRIT: 36.4 % (ref 36.0–46.0)
Hemoglobin: 12 g/dL (ref 12.0–15.0)
MCH: 29.9 pg (ref 26.0–34.0)
MCHC: 33 g/dL (ref 30.0–36.0)
MCV: 90.8 fL (ref 78.0–100.0)
Platelets: 162 10*3/uL (ref 150–400)
RBC: 4.01 MIL/uL (ref 3.87–5.11)
RDW: 12.6 % (ref 11.5–15.5)
WBC: 3.4 10*3/uL — ABNORMAL LOW (ref 4.0–10.5)

## 2014-05-20 LAB — HEMOGLOBIN A1C
Hgb A1c MFr Bld: 5.7 % — ABNORMAL HIGH (ref <5.7)
Mean Plasma Glucose: 117 mg/dL — ABNORMAL HIGH (ref <117)

## 2014-05-20 LAB — LIPID PANEL
Cholesterol: 148 mg/dL (ref 0–200)
HDL: 68 mg/dL (ref >39)
LDL CALC: 65 mg/dL (ref 0–99)
TRIGLYCERIDES: 74 mg/dL (ref <150)
Total CHOL/HDL Ratio: 2.2 RATIO
VLDL: 15 mg/dL (ref 0–40)

## 2014-05-20 LAB — TROPONIN I: Troponin I: <0.3 ng/mL (ref <0.30)

## 2014-05-20 NOTE — Discharge Instructions (Signed)
**PLEASE REMEMBER TO BRING ALL OF YOUR MEDICATIONS TO EACH OF YOUR FOLLOW-UP OFFICE VISITS.  Radial Site Care Refer to this sheet in the next few weeks. These instructions provide you with information on caring for yourself after your procedure. Your caregiver may also give you more specific instructions. Your treatment has been planned according to current medical practices, but problems sometimes occur. Call your caregiver if you have any problems or questions after your procedure. HOME CARE INSTRUCTIONS  You may shower the day after the procedure.Remove the bandage (dressing) and gently wash the site with plain soap and water.Gently pat the site dry.   Do not apply powder or lotion to the site.   Do not submerge the affected site in water for 3 to 5 days.   Inspect the site at least twice daily.   Do not flex or bend the affected arm for 24 hours.   No lifting over 5 pounds (2.3 kg) for 5 days after your procedure.   Do not drive home if you are discharged the same day of the procedure. Have someone else drive you.   You may drive 24 hours after the procedure unless otherwise instructed by your caregiver.  What to expect:  Any bruising will usually fade within 1 to 2 weeks.   Blood that collects in the tissue (hematoma) may be painful to the touch. It should usually decrease in size and tenderness within 1 to 2 weeks.  SEEK IMMEDIATE MEDICAL CARE IF:  You have unusual pain at the radial site.   You have redness, warmth, swelling, or pain at the radial site.   You have drainage (other than a small amount of blood on the dressing).   You have chills.   You have a fever or persistent symptoms for more than 72 hours.   You have a fever and your symptoms suddenly get worse.   Your arm becomes pale, cool, tingly, or numb.   You have heavy bleeding from the site. Hold pressure on the site.   Cardiac Diet This diet can help prevent heart disease and stroke. Many factors  influence your heart health, including eating and exercise habits. Coronary risk rises a lot with abnormal blood fat (lipid) levels. Cardiac meal planning includes limiting unhealthy fats, increasing healthy fats, and making other small dietary changes. General guidelines are as follows:  Adjust calorie intake to reach and maintain desirable body weight.  Limit total fat intake to less than 30% of total calories. Saturated fat should be less than 7% of calories.  Saturated fats are found in animal products and in some vegetable products. Saturated vegetable fats are found in coconut oil, cocoa butter, palm oil, and palm kernel oil. Read labels carefully to avoid these products as much as possible. Use butter in moderation. Choose tub margarines and oils that have 2 grams of fat or less. Good cooking oils are canola and olive oils.  Practice low-fat cooking techniques. Do not fry food. Instead, broil, bake, boil, steam, grill, roast on a rack, stir-fry, or microwave it. Other fat reducing suggestions include:  Remove the skin from poultry.  Remove all visible fat from meats.  Skim the fat off stews, soups, and gravies before serving them.  Steam vegetables in water or broth instead of sauting them in fat.  Avoid foods with trans fat (or hydrogenated oils), such as commercially fried foods and commercially baked goods. Commercial shortening and deep-frying fats will contain trans fat.  Increase intake of fruits, vegetables,  whole grains, and legumes to replace foods high in fat.  Increase consumption of nuts, legumes, and seeds to at least 4 servings weekly. One serving of a legume equals  cup, and 1 serving of nuts or seeds equals  cup.  Choose whole grains more often. Have 3 servings per day (a serving is 1 ounce [oz]).  Eat 4 to 5 servings of vegetables per day. A serving of vegetables is 1 cup of raw leafy vegetables;  cup of raw or cooked cut-up vegetables;  cup of vegetable  juice.  Eat 4 to 5 servings of fruit per day. A serving of fruit is 1 medium whole fruit;  cup of dried fruit;  cup of fresh, frozen, or canned fruit;  cup of 100% fruit juice.  Increase your intake of dietary fiber to 20 to 30 grams per day. Insoluble fiber may help lower your risk of heart disease and may help curb your appetite.  Soluble fiber binds cholesterol to be removed from the blood. Foods high in soluble fiber are dried beans, citrus fruits, oats, apples, bananas, broccoli, Brussels sprouts, and eggplant.  Try to include foods fortified with plant sterols or stanols, such as yogurt, breads, juices, or margarines. Choose several fortified foods to achieve a daily intake of 2 to 3 grams of plant sterols or stanols.  Foods with omega-3 fats can help reduce your risk of heart disease. Aim to have a 3.5 oz portion of fatty fish twice per week, such as salmon, mackerel, albacore tuna, sardines, lake trout, or herring. If you wish to take a fish oil supplement, choose one that contains 1 gram of both DHA and EPA.  Limit processed meats to 2 servings (3 oz portion) weekly.  Limit the sodium in your diet to 1500 milligrams (mg) per day. If you have high blood pressure, talk to a registered dietitian about a DASH (Dietary Approaches to Stop Hypertension) eating plan.  Limit sweets and beverages with added sugar, such as soda, to no more than 5 servings per week. One serving is:   1 tablespoon sugar.  1 tablespoon jelly or jam.   cup sorbet.  1 cup lemonade.   cup regular soda. CHOOSING FOODS Starches  Allowed: Breads: All kinds (wheat, rye, raisin, white, oatmeal, [place], [place], and English muffin bread). Low-fat rolls: English muffins, frankfurter and hamburger buns, bagels, pita bread, tortillas (not fried). Pancakes, waffles, biscuits, and muffins made with recommended oil.  Avoid: Products made with saturated or trans fats, oils, or whole milk products. Butter rolls,  cheese breads, croissants. Commercial doughnuts, muffins, sweet rolls, biscuits, waffles, pancakes, store-bought mixes. Crackers  Allowed: Low-fat crackers and snacks: Animal, graham, rye, saltine (with recommended oil, no lard), oyster, and matzo crackers. Bread sticks, melba toast, rusks, flatbread, pretzels, and light popcorn.  Avoid: High-fat crackers: cheese crackers, butter crackers, and those made with coconut, palm oil, or trans fat (hydrogenated oils). Buttered popcorn. Cereals  Allowed: Hot or cold whole-grain cereals.  Avoid: Cereals containing coconut, hydrogenated vegetable fat, or animal fat. Potatoes / Pasta / Rice  Allowed: All kinds of potatoes, rice, and pasta (such as macaroni, spaghetti, and noodles).  Avoid: Pasta or rice prepared with cream sauce or high-fat cheese. Chow mein noodles, [place] fries. Vegetables  Allowed: All vegetables and vegetable juices.  Avoid: Fried vegetables. Vegetables in cream, butter, or high-fat cheese sauces. Limit coconut. Fruit in cream or custard. Protein  Allowed: Limit your intake of meat, seafood, and poultry to no more than 6 oz (  cooked weight) per day. All lean, well-trimmed beef, veal, pork, and lamb. All chicken and [place] without skin. All fish and shellfish. Wild game: wild duck, rabbit, pheasant, and venison. Egg whites or low-cholesterol egg substitutes may be used as desired. Meatless dishes: recipes with dried beans, peas, lentils, and tofu (soybean curd). Seeds and nuts: all seeds and most nuts.  Avoid: Prime grade and other heavily marbled and fatty meats, such as short ribs, spare ribs, rib eye roast or steak, frankfurters, sausage, bacon, and high-fat luncheon meats, mutton. Caviar. Commercially fried fish. Domestic duck, goose, venison sausage. Organ meats: liver, gizzard, heart, chitterlings, brains, kidney, sweetbreads. Dairy  Allowed: Low-fat cheeses: nonfat or low-fat cottage cheese (1% or 2% fat), cheeses made with  part skim milk, such as mozzarella, farmers, string, or ricotta. (Cheeses should be labeled no more than 2 to 6 grams fat per oz.). Skim (or 1%) milk: liquid, powdered, or evaporated. Buttermilk made with low-fat milk. Drinks made with skim or low-fat milk or cocoa. Chocolate milk or cocoa made with skim or low-fat (1%) milk. Nonfat or low-fat yogurt.  Avoid: Whole milk cheeses, including colby, cheddar, [place], Monterey Jack, [place], [place], [place], American, Swiss, and blue. Creamed cottage cheese, cream cheese. Whole milk and whole milk products, including buttermilk or yogurt made from whole milk, drinks made from whole milk. Condensed milk, evaporated whole milk, and 2% milk. Soups and Combination Foods  Allowed: Low-fat low-sodium soups: broth, dehydrated soups, homemade broth, soups with the fat removed, homemade cream soups made with skim or low-fat milk. Low-fat spaghetti, lasagna, chili, and Spanish rice if low-fat ingredients and low-fat cooking techniques are used.  Avoid: Cream soups made with whole milk, cream, or high-fat cheese. All other soups. Desserts and Sweets  Allowed: Sherbet, fruit ices, gelatins, meringues, and angel food cake. Homemade desserts with recommended fats, oils, and milk products. Jam, jelly, honey, marmalade, sugars, and syrups. Pure sugar candy, such as gum drops, hard candy, jelly beans, marshmallows, mints, and small amounts of dark chocolate.  Avoid: Commercially prepared cakes, pies, cookies, frosting, pudding, or mixes for these products. Desserts containing whole milk products, chocolate, coconut, lard, palm oil, or palm kernel oil. Ice cream or ice cream drinks. Candy that contains chocolate, coconut, butter, hydrogenated fat, or unknown ingredients. Buttered syrups. Fats and Oils  Allowed: Vegetable oils: safflower, sunflower, corn, soybean, cottonseed, sesame, canola, olive, or peanut. Non-hydrogenated margarines. Salad dressing or mayonnaise:  homemade or commercial, made with a recommended oil. Low or nonfat salad dressing or mayonnaise.  Limit added fats and oils to 6 to 8 tsp per day (includes fats used in cooking, baking, salads, and spreads on bread). Remember to count the "hidden fats" in foods.  Avoid: Solid fats and shortenings: butter, lard, salt pork, bacon drippings. Gravy containing meat fat, shortening, or suet. Cocoa butter, coconut. Coconut oil, palm oil, palm kernel oil, or hydrogenated oils: these ingredients are often used in bakery products, nondairy creamers, whipped toppings, candy, and commercially fried foods. Read labels carefully. Salad dressings made of unknown oils, sour cream, or cheese, such as blue cheese and Roquefort. Cream, all kinds: half-and-half, light, heavy, or whipping. Sour cream or cream cheese (even if "light" or low-fat). Nondairy cream substitutes: coffee creamers and sour cream substitutes made with palm, palm kernel, hydrogenated oils, or coconut oil. Beverages  Allowed: Coffee (regular or decaffeinated), tea. Diet carbonated beverages, mineral water. Alcohol: Check with your caregiver. Moderation is recommended.  Avoid: Whole milk, regular sodas, and juice drinks with  added sugar. Condiments  Allowed: All seasonings and condiments. Cocoa powder. "Cream" sauces made with recommended ingredients.  Avoid: Carob powder made with hydrogenated fats. SAMPLE MENU Breakfast   cup orange juice   cup oatmeal  1 slice toast  1 tsp margarine  1 cup skim milk Lunch  [place] sandwich with 2 oz [place], 2 slices bread  Lettuce and tomato slices  Fresh fruit  Carrot sticks  Coffee or tea Snack  Fresh fruit or low-fat crackers Dinner  3 oz lean ground beef  1 baked potato  1 tsp margarine   cup asparagus  Lettuce salad  1 tbs non-creamy dressing   cup peach slices  1 cup skim milk Document Released: 08/19/2008 Document Revised: 05/11/2012 Document Reviewed:  02/03/2012 ExitCare Patient Information 2015 [place], [place]. This information is not intended to replace advice given to you by your health care provider. Make sure you discuss any questions you have with your health care provider.

## 2014-05-20 NOTE — Care Management Utilization Note (Signed)
UR completed.    Julie Wise Amerson, RN, BSN Phone #[phone]  

## 2014-05-20 NOTE — Discharge Summary (Signed)
Discharge Summary   Patient ID: Mariah Everett,  MRN: [id number], DOB/AGE: January 25, 1956 58 y.o.  Admit date: 05/19/2014 Discharge date: 05/20/2014  Primary Care Provider: Christinia Gully Primary Cardiologist: D. Bensimhon, MD  Discharge Diagnoses Principal Problem:   Chest pain on exertion  *s/p catheterization this admission revealing normal coronary arteries.  Allergies No Known Allergies  Procedures  Cardiac Catheterization 6.26.2015  CATH Findings:   Ao Pressure: 120/80 (99)   LV Pressure: 117/10/13   There was no signficant gradient across the aortic valve on pullback.   Left main: Normal. Trifurcates into a small LAD, very large ramus branch and LCX. Normal   LAD: Normal. Small to moderate sized vessel feeding mostly the septal territory.   RAMUS: Very large vessel feeding most of LAD territory. Normal   LCX: Small OM-1. Large OM-2. Normal.   RCA: Very large dominant vessel. Normal.   LV-gram done in the RAO projection: Ejection fraction = 60% Normal wall motion.   Assessment:  1. Normal coronary arteries   2. Normal LV function _____________    History of Present Illness  58 year old female without prior cardiac history. She was in her usual state of health until March of this year, which began expanded intermittent exertional chest discomfort. Symptoms became more frequent and severe prompting her to present to the Med center at [facility] on June 26. There, cardiac markers were normal and ECG was nonacute. She was transferred to [facility] for further evaluation.  Hospital Course  Patient ruled out for myocardial infarction. Given exertional symptoms, decision was made to pursue diagnostic catheterization. This was performed on June 26 which showed normal coronary arteries and normal LV function. She's been ambulating post procedure without recurrent symptoms or limitations and will be discharged home this morning in good condition.  Discharge Vitals Blood pressure  139/93, pulse 72, temperature 98.4 F (36.9 C), temperature source Oral, resp. rate 20, weight 131 lb (59.421 kg), SpO2 100.00%.  Filed Weights   05/20/14 0432  Weight: 131 lb (59.421 kg)    Labs  CBC  Recent Labs  05/19/14 1210 05/19/14 2048 05/20/14 0045  WBC 2.7* 3.1* 3.4*  NEUTROABS 0.5*  --   --   HGB 14.4 12.5 12.0  HCT 42.4 38.6 36.4  MCV 90.4 92.1 90.8  PLT 169 169 413   Basic Metabolic Panel  Recent Labs  05/19/14 1210 05/19/14 2048 05/20/14 0045  NA 142  --  144  K 4.0  --  4.0  CL 103  --  107  CO2 27  --  25  GLUCOSE 144*  --  95  BUN 6  --  9  CREATININE 0.60 0.55 0.64  CALCIUM 10.4  --  8.7  MG  --  1.7  --    Liver Function Tests  Recent Labs  05/19/14 1210 05/20/14 0045  AST 19 14  ALT 15 10  ALKPHOS 44 35*  BILITOT 0.9 0.7  PROT 7.8 6.2  ALBUMIN 4.6 3.6    Recent Labs  05/19/14 1210  LIPASE 48   Cardiac Enzymes  Recent Labs  05/19/14 2048 05/20/14 0045 05/20/14 0536  TROPONINI <0.30 <0.30 <0.30   D-Dimer  Recent Labs  05/19/14 1850  DDIMER <0.27   Hemoglobin A1C  Recent Labs  05/19/14 2048  HGBA1C 5.7*   Fasting Lipid Panel  Recent Labs  05/20/14 0045  CHOL 148  HDL 68  LDLCALC 65  TRIG 74  CHOLHDL 2.2   Thyroid Function Tests  Recent Labs  05/19/14 2048  TSH 1.210    Disposition  Pt is being discharged home today in good condition.  Follow-up Plans & Appointments  Follow-up Information   Follow up with Christinia Gully, MD.   Specialty:  Pulmonary Disease   Contact information:   [address] [address] [phone]       Discharge Medications    Medication List         fexofenadine 180 MG tablet  Commonly known as:  ALLEGRA  Take 180 mg by mouth daily as needed for allergies or rhinitis.     hydrocortisone 25 MG suppository  Commonly known as:  ANUSOL-HC  Place 25 mg rectally 2 (two) times daily as needed for hemorrhoids or itching.        Outstanding  Labs/Studies  None  Duration of Discharge Encounter   Greater than 30 minutes including physician time.  Signed, Murray Hodgkins NP 05/20/2014, 12:31 PM

## 2014-05-20 NOTE — Progress Notes (Addendum)
TR band removed. Rt radial level 0. Old blood noted. Pulse +2. A tegaderm & 2x2 applied to the site. Pt educated on care of the site as well as when to call the MD. Will continue to monitor the pt. Hoover Brunette, RN

## 2014-05-20 NOTE — Progress Notes (Signed)
Subjective:  No recurrent chest pain; Feels well  Objective:   Vital Signs in the last 24 hours: Temp:  [97.6 F (36.4 C)-98.4 F (36.9 C)] 98.4 F (36.9 C) (06/27 0432) Pulse Rate:  [70-91] 72 (06/27 0432) Resp:  [18-20] 20 (06/27 0432) BP: (112-163)/(71-100) 139/93 mmHg (06/27 0432) SpO2:  [81 %-100 %] 100 % (06/26 2145) Weight:  [131 lb (59.421 kg)] 131 lb (59.421 kg) (06/27 0432)  Intake/Output from previous day:    Medications: . acetaminophen  650 mg Oral Once  . aspirin  324 mg Oral NOW   Or  . aspirin  300 mg Rectal NOW  . aspirin EC  81 mg Oral Daily  . cholecalciferol  1,000 Units Oral Daily  . heparin  5,000 Units Subcutaneous 3 times per day  . sodium chloride  3 mL Intravenous Q12H    . sodium chloride Stopped (05/19/14 1837)  . sodium chloride 75 mL/hr at 05/20/14 0655    Physical Exam:   General appearance: alert, cooperative and no distress Neck: no adenopathy, no carotid bruit, no JVD, supple, symmetrical, trachea midline and thyroid not enlarged, symmetric, no tenderness/mass/nodules Lungs: clear to auscultation bilaterally Heart: regular rate and rhythm Abdomen: soft, non-tender; bowel sounds normal; no masses,  no organomegaly R radial cath site stable Extremities: no edema, redness or tenderness in the calves or thighs Skin: Skin color, texture, turgor normal. No rashes or lesions Neurologic: Grossly normal   Rate: 75  Rhythm: normal sinus rhythm  Lab Results:   Recent Labs  05/19/14 1210 05/19/14 2048 05/20/14 0045  NA 142  --  144  K 4.0  --  4.0  CL 103  --  107  CO2 27  --  25  GLUCOSE 144*  --  95  BUN 6  --  9  CREATININE 0.60 0.55 0.64     Recent Labs  05/20/14 0045 05/20/14 0536  TROPONINI <0.30 <0.30    Hepatic Function Panel  Recent Labs  05/20/14 0045  PROT 6.2  ALBUMIN 3.6  AST 14  ALT 10  ALKPHOS 35*  BILITOT 0.7    Recent Labs  05/19/14 2048  INR 1.03   BNP (last 3 results)  Recent  Labs  05/19/14 1210  PROBNP 57.3    d- dimer: negative  < 0.27  Lipid Panel     Component Value Date/Time   CHOL 148 05/20/2014 0045   TRIG 74 05/20/2014 0045   HDL 68 05/20/2014 0045   CHOLHDL 2.2 05/20/2014 0045   VLDL 15 05/20/2014 0045   LDLCALC 65 05/20/2014 0045      Imaging:  Dg Chest Port 1 View  05/19/2014   CLINICAL DATA:  Chest discomfort Chest discomfort  EXAM: PORTABLE CHEST - 1 VIEW  COMPARISON:  Two view chest dated 12/15/2008.  FINDINGS: The heart size and mediastinal contours are within normal limits. Both lungs are clear. The visualized skeletal structures are unremarkable.  IMPRESSION: No active disease.   Electronically Signed   By: Margaree Mackintosh M.D.   On: 05/19/2014 12:59    CATH Findings:  Ao Pressure: 120/80 (99)  LV Pressure: 117/10/13  There was no signficant gradient across the aortic valve on pullback.  Left main: Normal. Trifurcates into a small LAD, very large ramus branch and LCX. Normal  LAD: Normal. Small to moderate sized vessel feeding mostly the septal territory.  RAMUS: Very large vessel feeding most of LAD territory. Normal  LCX: Small OM-1. Large OM-2. Normal.  RCA: Very large dominant vessel. Normal.  LV-gram done in the RAO projection: Ejection fraction = 60% Normal wall motion.  Assessment:  1. Normal coronary arteries  2. Normal LV function   Assessment/Plan:   Active Problems:   Chest pain on exertion   Intermediate coronary syndrome   Chest pain  Doing well; No CAD, nl d-dimer. No recurrent chest discomfort.. DC today. F/U Dr. Melvyn Novas.    Troy Sine, MD, [facility] 05/20/2014, 10:28 AM

## 2014-05-22 ENCOUNTER — Encounter: Payer: Self-pay | Admitting: Internal Medicine

## 2014-05-22 LAB — PATHOLOGIST SMEAR REVIEW

## 2014-11-02 ENCOUNTER — Encounter (HOSPITAL_COMMUNITY): Payer: Self-pay | Admitting: Internal Medicine

## 2014-11-07 ENCOUNTER — Ambulatory Visit (INDEPENDENT_AMBULATORY_CARE_PROVIDER_SITE_OTHER): Payer: BC Managed Care – PPO | Admitting: Internal Medicine

## 2014-11-07 ENCOUNTER — Encounter (INDEPENDENT_AMBULATORY_CARE_PROVIDER_SITE_OTHER): Payer: Self-pay

## 2014-11-07 ENCOUNTER — Other Ambulatory Visit (INDEPENDENT_AMBULATORY_CARE_PROVIDER_SITE_OTHER): Payer: BC Managed Care – PPO

## 2014-11-07 ENCOUNTER — Encounter: Payer: Self-pay | Admitting: Internal Medicine

## 2014-11-07 VITALS — BP 120/84 | HR 80 | Ht 64.5 in

## 2014-11-07 DIAGNOSIS — H6123 Impacted cerumen, bilateral: Secondary | ICD-10-CM

## 2014-11-07 DIAGNOSIS — Z0000 Encounter for general adult medical examination without abnormal findings: Secondary | ICD-10-CM

## 2014-11-07 DIAGNOSIS — R05 Cough: Secondary | ICD-10-CM

## 2014-11-07 DIAGNOSIS — Z23 Encounter for immunization: Secondary | ICD-10-CM

## 2014-11-07 DIAGNOSIS — M5432 Sciatica, left side: Secondary | ICD-10-CM

## 2014-11-07 DIAGNOSIS — E785 Hyperlipidemia, unspecified: Secondary | ICD-10-CM

## 2014-11-07 DIAGNOSIS — R0789 Other chest pain: Secondary | ICD-10-CM

## 2014-11-07 DIAGNOSIS — R058 Other specified cough: Secondary | ICD-10-CM

## 2014-11-07 LAB — BASIC METABOLIC PANEL
BUN: 8 mg/dL (ref 6–23)
CO2: 25 mEq/L (ref 19–32)
Calcium: 9.6 mg/dL (ref 8.4–10.5)
Chloride: 107 mEq/L (ref 96–112)
Creatinine, Ser: 0.6 mg/dL (ref 0.4–1.2)
GFR: 133.33 mL/min (ref >60.00)
Glucose, Bld: 110 mg/dL — ABNORMAL HIGH (ref 70–99)
Potassium: 4.4 mEq/L (ref 3.5–5.1)
SODIUM: 139 meq/L (ref 135–145)

## 2014-11-07 LAB — CBC WITH DIFFERENTIAL/PLATELET
Basophils Absolute: 0 10*3/uL (ref 0.0–0.1)
Basophils Relative: 0.2 % (ref 0.0–3.0)
Eosinophils Absolute: 0 10*3/uL (ref 0.0–0.7)
Eosinophils Relative: 0.5 % (ref 0.0–5.0)
HCT: 43.9 % (ref 36.0–46.0)
HEMOGLOBIN: 14.4 g/dL (ref 12.0–15.0)
Lymphs Abs: 1.7 10*3/uL (ref 0.7–4.0)
MCHC: 32.9 g/dL (ref 30.0–36.0)
MCV: 91.7 fl (ref 78.0–100.0)
MONOS PCT: 7.4 % (ref 3.0–12.0)
Monocytes Absolute: 0.2 10*3/uL (ref 0.1–1.0)
NEUTROS ABS: 0.7 10*3/uL — AB (ref 1.4–7.7)
NEUTROS PCT: 26.9 % — AB (ref 43.0–77.0)
Platelets: 188 10*3/uL (ref 150.0–400.0)
RBC: 4.78 Mil/uL (ref 3.87–5.11)
RDW: 12.9 % (ref 11.5–15.5)
WBC: 2.6 10*3/uL — AB (ref 4.0–10.5)

## 2014-11-07 LAB — URINALYSIS
BILIRUBIN URINE: NEGATIVE
Hgb urine dipstick: NEGATIVE
Ketones, ur: NEGATIVE
LEUKOCYTES UA: NEGATIVE
Nitrite: NEGATIVE
PH: 5.5 (ref 5.0–8.0)
Specific Gravity, Urine: 1.02 (ref 1.000–1.030)
Total Protein, Urine: NEGATIVE
UROBILINOGEN UA: 0.2 (ref 0.0–1.0)
Urine Glucose: NEGATIVE

## 2014-11-07 LAB — LIPID PANEL
CHOLESTEROL: 193 mg/dL (ref 0–200)
HDL: 75.1 mg/dL (ref >39.00)
LDL CALC: 108 mg/dL — AB (ref 0–99)
NonHDL: 117.9
TRIGLYCERIDES: 48 mg/dL (ref 0.0–149.0)
Total CHOL/HDL Ratio: 3
VLDL: 9.6 mg/dL (ref 0.0–40.0)

## 2014-11-07 LAB — HEPATIC FUNCTION PANEL
ALBUMIN: 4.6 g/dL (ref 3.5–5.2)
ALT: 13 U/L (ref 0–35)
AST: 18 U/L (ref 0–37)
Alkaline Phosphatase: 40 U/L (ref 39–117)
BILIRUBIN DIRECT: 0.2 mg/dL (ref 0.0–0.3)
TOTAL PROTEIN: 7.4 g/dL (ref 6.0–8.3)
Total Bilirubin: 1 mg/dL (ref 0.2–1.2)

## 2014-11-07 LAB — TSH: TSH: 1.43 u[IU]/mL (ref 0.35–4.50)

## 2014-11-07 NOTE — Progress Notes (Signed)
Subjective:    Patient ID: Mariah Everett, female    DOB: [birth year]  MRN: [id number]    Brief patient profile:  58 yobf smoked only in HS with new cough since 2007 indolent onset assoc with sensation of pnds more day than night minimally productive esp in am, assoc with occ HB.  >>> resolved on ppi and decongestants.        History of Present Illness    12/10/12 Acute OV  Presents for persistent lower abdominal pain and supra pubic pressure and dysuria  Complains of  dysuria, burning, increased frequency x2 weeks    Does have some flank pain w/ radiation to sides. Dull ache at times w/ no waves of pain  Main issue is she has suprapubic pain and pressures w/ urinary urgency and then severe burning w/ urination . Azo not helping.  Has seen GYN x 2 . Given septra initially, symptoms improved but returned when off abx. Rx Cipro w/ improvement. Off abx symptoms returned. Cx by GYN reported to pt as neg.  No records currently available.  Has had several episodes of bright red blood in toilet after BM . Taking advil round the clock last 2 weeks for pain in suprapubic region.  Was seen by GI yesterday. Told she has hemorrhoids . Set up for CT abd and pelvis next week.  No fever, n/v/d. No constipation or hard stools. No straining.  UA today w/ sm blood otherwise neg.  Has been referred to Urology w/ ov on 1/20  rec Protocol HC cream after bowel movements.  Vicodin 1/2-1 every 6 hrs as needed for pain.  Begin Prilosec 20mg  daily for 2 weeks  Gas x As needed  Bloating  Pyridium 100mg  Three times a day  As needed  Urinary burning.  Do not use NSAIDS, advil, ibuprofen, aleve like meds CT abd done in [place] > reported neg    Saw GI > rec maint rx, did not take it "because listed may cause depression"   07/20/2013 acute ov/Wert re abd pain no longer using citrucel maint as prev rec  Chief Complaint  Patient presents with  . Acute Visit    Pt c/o abd pain and "grumbling" x 3 wks.  She also c/o fatigue since her symptoms started.   pain is more epigastic but really diffuse, bilateral, episodic,  better p gaviscon,  Better immediately p eating, stopped daily  routine about Aug 1 when stopped working.no change pain lying down but doesn't wake her > sleeps ok. No problems with exercise, no change in bms n or v.  rec Resume citrucel one heaping twice daily with glass of water No boiled eggs, no undercooked veggies,  No beans Gaviscon as needed  05/19/14 to ER with atypical cp ? gerd   11/07/2014 f/u ov/Wert re: cpx / on intermittent prilosec  Chief Complaint  Patient presents with  . Annual Exam    Pt is fasting. She denies any co's today.     1) pnds x years, worse in am, worse in fall  2) new R back pain rad to mid calf on advil / heat no numbness/ weakness/ rash     No  Sob or  cough or cp or chest tightness, subjective wheeze overt sinus or hb symptoms. No unusual exp hx    Sleeping ok without nocturnal  or early am exacerbation  of respiratory  c/o's or need for noct saba. Also denies any obvious fluctuation of symptoms with weather or environmental  changes or other aggravating or alleviating factors except as outlined above   Current Medications, Allergies, Past Medical History, Past Surgical History, Family History, and Social History were reviewed in [employer] [facility] record.  ROS  The following are not active complaints unless bolded sore throat, dysphagia, dental problems, itching, sneezing,  nasal congestion or excess/ purulent secretions, ear ache,   fever, chills, sweats, unintended wt loss, pleuritic or exertional cp, hemoptysis,  orthopnea pnd or leg swelling, presyncope, palpitations, heartburn, abdominal pain, anorexia, nausea, vomiting, diarrhea  or change in bowel or urinary habits, change in stools or urine, dysuria,hematuria,  rash, arthralgias, visual complaints, headache, numbness weakness or ataxia or problems with walking or  coordination,  change in mood/affect or memory.      Past Medical History:  Health Maintenance  - CPX  11/07/2014  - DT December 15, 2008  - DEXA 12/22/2008 T spine + .9 LFem -1.2, R Fem -.7 > repeat ordered 11/07/14  - GYN  Tavon Neck/shoulder pain radicular C7/8 right  - MRI 12/22/2008 Pos C7 disc protrusion > Discectomy and fusion 05/18/2009 [facility] in [place]  Thyroid nodule  - MRI 12/22/08  - U/S and bx May 11, 2009 > c/w benign goiter, TSH nl so no rx  Dermatits ?eczema ..............................  Scales/ Durharm Relative chronic Neutropenia  - dx  2010  Rectal bleeeding...................................Marland Kitchen  Dr Margaret Pyle [facility] Ser - nl colonoscopy 08/2011     Family History:  Mother died from ovarian cancer  Father's family with throat cancer (smokers)  Lupus sister  Negative for respiratory diseases  Allergies brother    Social History:  Never smoker  Married  retired from [employer]          Objective:   Physical Exam    11/03/2013      132 > 11/07/2014  132  Wt Readings from Last 3 Encounters:  07/20/13 135 lb 6.4 oz (61.417 kg)  12/10/12 135 lb 12.8 oz (61.598 kg)  09/16/12 134 lb (60.782 kg)    Ambulatory healthy appearing  Bf  in no acute distress.  HEENT: nl dentition, turbinates, and orophanx - wax impaction R > L  Neck without JVD/Nodes/TM/ no pain on flex/ext. No palp thyroid nodules  Lungs clear to A and P bilaterally without cough on insp or exp maneuvers  RRR no s3 or murmur or increase in P2  Nl pulses Abd not distended, no tenderness , BS +  No bruits or organomegaly , neg CVA tenderness   Ext warm without calf tenderness, cyanosis clubbing or edema MS nl gait no deformities/ restrictions -   Neuro nl sensorium, no motor or cerebellar def, no pathologic reflexes Skin: no rash     pCXR 05/19/14  No active disease.   Recent Labs Lab 11/07/14 0943  NA 139  K 4.4  CL 107  CO2 25  BUN 8  CREATININE 0.6  GLUCOSE 110*    Recent Labs Lab  11/07/14 0943  HGB 14.4  HCT 43.9  WBC 2.6*  PLT 188.0     Lab Results  Component Value Date   TSH 1.43 11/07/2014           Assessment & Plan:

## 2014-11-07 NOTE — Patient Instructions (Addendum)
Contact your [facility] neurosurgeon if your R leg starts to bother you more or you start needing more advil to control the pain   Please see patient coordinator before you leave today  to schedule bone density   Try prilosec 20mg   Take 30-60 min before first meal of the day and Pepcid 20 mg one bedtime  And For drainage take chlortrimeton (chlorpheniramine) 4 mg  One or two at bedtime and just use allegra as needed for drainage at bedtime   Please remember to go to the lab  department downstairs for your tests - we will call you with the results when they are available.    Please schedule a follow up visit in 3 months but call sooner if needed

## 2014-11-08 NOTE — Progress Notes (Signed)
Quick Note:  LMTCB ______ 

## 2014-11-10 DIAGNOSIS — M5432 Sciatica, left side: Secondary | ICD-10-CM | POA: Insufficient documentation

## 2014-11-10 DIAGNOSIS — R058 Other specified cough: Secondary | ICD-10-CM | POA: Insufficient documentation

## 2014-11-10 DIAGNOSIS — R05 Cough: Secondary | ICD-10-CM | POA: Insufficient documentation

## 2014-11-10 HISTORY — DX: Sciatica, left side: M54.32

## 2014-11-10 LAB — VITAMIN D 1,25 DIHYDROXY
VITAMIN D3 1, 25 (OH): 79 pg/mL
Vitamin D 1, 25 (OH)2 Total: 79 pg/mL — ABNORMAL HIGH (ref 18–72)
Vitamin D2 1, 25 (OH)2: <8 pg/mL

## 2014-11-10 NOTE — Assessment & Plan Note (Signed)
Vit D levels ok, needs bone density repeated in next year All gyn hm per Dr Edwyna Ready

## 2014-11-10 NOTE — Assessment & Plan Note (Signed)
Lab Results  Component Value Date   CHOL 193 11/07/2014   HDL 75.10 11/07/2014   LDLCALC 108* 11/07/2014   LDLDIRECT 112.3 11/03/2013   TRIG 48.0 11/07/2014   CHOLHDL 3 11/07/2014    Adequate control on present rx, reviewed > no change in rx needed  / diet / ex only

## 2014-11-10 NOTE — Assessment & Plan Note (Signed)
rec otc kit/ f/u Np prn

## 2014-11-10 NOTE — Assessment & Plan Note (Signed)
Classic Upper airway cough syndrome, so named because it's frequently impossible to sort out how much is  CR/sinusitis with freq throat clearing (which can be related to primary GERD)   vs  causing  secondary (" extra esophageal")  GERD from wide swings in gastric pressure that occur with throat clearing, often  promoting self use of mint and menthol lozenges that reduce the lower esophageal sphincter tone and exacerbate the problem further in a cyclical fashion.   These are the same pts (now being labeled as having "irritable larynx syndrome" by some cough centers) who not infrequently have a history of having failed to tolerate ace inhibitors,  dry powder inhalers or biphosphonates or report having atypical reflux symptoms that don't respond to standard doses of PPI , and are easily confused as having aecopd or asthma flares by even experienced allergists/ pulmonologists.  For now rec trial of consistent gerd rx then regroup

## 2014-11-10 NOTE — Assessment & Plan Note (Signed)
LHC 05/19/14 >  Nl coronary arteries  Very strongly suggestive of gerd > rec maint rx  See instructions for specific recommendations which were reviewed directly with the patient who was given a copy with highlighter outlining the key components.

## 2014-11-10 NOTE — Assessment & Plan Note (Signed)
She has prev C7 disc and now with radiular pain ? L5 distribution > referred back to [facility] rather than attempt to treat with nsaids  Or steroids given her chronic UGI issues

## 2014-11-14 NOTE — Progress Notes (Signed)
Quick Note:  Spoke with pt and notified of results per Dr. Wert. Pt verbalized understanding and denied any questions.  ______ 

## 2014-11-15 ENCOUNTER — Ambulatory Visit (INDEPENDENT_AMBULATORY_CARE_PROVIDER_SITE_OTHER)
Admission: RE | Admit: 2014-11-15 | Discharge: 2014-11-15 | Disposition: A | Discharge status: Home / Self Care | Payer: BC Managed Care – PPO | Source: Ambulatory Visit | Attending: Internal Medicine | Admitting: Internal Medicine

## 2014-11-15 DIAGNOSIS — Z0000 Encounter for general adult medical examination without abnormal findings: Secondary | ICD-10-CM

## 2014-11-22 NOTE — Progress Notes (Signed)
Quick Note:  LMTCB ______ 

## 2015-05-14 ENCOUNTER — Encounter: Payer: Self-pay | Admitting: Internal Medicine

## 2015-05-14 ENCOUNTER — Ambulatory Visit (INDEPENDENT_AMBULATORY_CARE_PROVIDER_SITE_OTHER): Payer: BLUE CROSS/BLUE SHIELD | Admitting: Internal Medicine

## 2015-05-14 ENCOUNTER — Telehealth: Payer: Self-pay | Admitting: Internal Medicine

## 2015-05-14 VITALS — BP 144/90 | HR 89 | Ht 65.0 in | Wt 133.6 lb

## 2015-05-14 DIAGNOSIS — R058 Other specified cough: Secondary | ICD-10-CM

## 2015-05-14 DIAGNOSIS — R05 Cough: Secondary | ICD-10-CM | POA: Diagnosis not present

## 2015-05-14 MED ORDER — HYDROCODONE-HOMATROPINE 5-1.5 MG/5ML PO SYRP: 5.0000 mL | ORAL_SOLUTION | Freq: Four times a day (QID) | ORAL | Status: DC | PRN

## 2015-05-14 MED ORDER — AZITHROMYCIN 250 MG PO TABS: ORAL_TABLET | ORAL | Status: DC

## 2015-05-14 NOTE — Progress Notes (Signed)
Subjective:    Patient ID: Mariah Everett, female    DOB: 1956-01-02  MRN: [id number]    Brief patient profile:  69 yobf smoked only in HS with new cough since 2007 indolent onset assoc with sensation of pnds more day than night minimally productive esp in am, assoc with occ HB.  >>> resolved on ppi and decongestants.        History of Present Illness    12/10/12 NP Acute OV  Presents for persistent lower abdominal pain and supra pubic pressure and dysuria  Complains of  dysuria, burning, increased frequency x2 weeks    Does have some flank pain w/ radiation to sides. Dull ache at times w/ no waves of pain  Main issue is she has suprapubic pain and pressures w/ urinary urgency and then severe burning w/ urination . Azo not helping.  Has seen GYN x 2 . Given septra initially, symptoms improved but returned when off abx. Rx Cipro w/ improvement. Off abx symptoms returned. Cx by GYN reported to pt as neg.  No records currently available.  Has had several episodes of bright red blood in toilet after BM . Taking advil round the clock last 2 weeks for pain in suprapubic region.  Was seen by GI yesterday. Told she has hemorrhoids . Set up for CT abd and pelvis next week.  No fever, n/v/d. No constipation or hard stools. No straining.  UA today w/ sm blood otherwise neg.  Has been referred to Urology w/ ov on 1/20  rec Protocol HC cream after bowel movements.  Vicodin 1/2-1 every 6 hrs as needed for pain.  Begin Prilosec 20mg  daily for 2 weeks  Gas x As needed  Bloating  Pyridium 100mg  Three times a day  As needed  Urinary burning.  Do not use NSAIDS, advil, ibuprofen, aleve like meds CT abd done in [place] > reported neg    Saw GI > rec maint rx, did not take it "because listed may cause depression"   07/20/2013 acute ov/Jerimey Burridge re abd pain no longer using citrucel maint as prev rec  Chief Complaint  Patient presents with  . Acute Visit    Pt c/o abd pain and "grumbling" x 3 wks.  She also c/o fatigue since her symptoms started.   pain is more epigastic but really diffuse, bilateral, episodic,  better p gaviscon,  Better immediately p eating, stopped daily  routine about Aug 1 when stopped working.no change pain lying down but doesn't wake her > sleeps ok. No problems with exercise, no change in bms n or v.  rec Resume citrucel one heaping twice daily with glass of water No boiled eggs, no undercooked veggies,  No beans Gaviscon as needed  05/19/14 to ER with atypical cp ? gerd   05/14/2015 acute  ov/Brynden Thune re: acute onset harsh upper airway cough / not on consistent gerd rx  Chief Complaint  Patient presents with  . Acute Visit    Pt c/o rhinitis, cough and chest tightness for the past 4 days. Cough is mainly non prod.      Started abruplty with sore throat / watery rhinitis / no fever or purulent sputum   No  Sob or  cough or cp or chest tightness, subjective wheeze overt sinus or hb symptoms. No unusual exp hx    Sleeping ok without nocturnal  or early am exacerbation  of respiratory  c/o's or need for noct saba. Also denies any obvious fluctuation of symptoms with  weather or environmental changes or other aggravating or alleviating factors except as outlined above   Current Medications, Allergies, Past Medical History, Past Surgical History, Family History, and Social History were reviewed in [employer] [facility] record.  ROS  The following are not active complaints unless bolded sore throat, dysphagia, dental problems, itching, sneezing,  nasal congestion or excess/ purulent secretions, ear ache,   fever, chills, sweats, unintended wt loss, pleuritic or exertional cp, hemoptysis,  orthopnea pnd or leg swelling, presyncope, palpitations, heartburn, abdominal pain, anorexia, nausea, vomiting, diarrhea  or change in bowel or urinary habits, change in stools or urine, dysuria,hematuria,  rash, arthralgias, visual complaints, headache, numbness weakness or  ataxia or problems with walking or coordination,  change in mood/affect or memory.      Past Medical History:  Health Maintenance  - CPX  11/07/2014  - DT December 15, 2008  - DEXA 12/22/2008 T spine + .9 LFem -1.2, R Fem -.7 > repeat ordered 11/07/14  - GYN  Tavon Neck/shoulder pain radicular C7/8 right  - MRI 12/22/2008 Pos C7 disc protrusion > Discectomy and fusion 05/18/2009 [facility] in [place]  Thyroid nodule  - MRI 12/22/08  - U/S and bx May 11, 2009 > c/w benign goiter, TSH nl so no rx  Dermatits ?eczema ..............................  Scales/ Durharm Relative chronic Neutropenia  - dx  2010  Rectal bleeeding...................................Marland Kitchen  Dr Margaret Pyle [facility] Ser - nl colonoscopy 08/2011     Family History:  Mother died from ovarian cancer  Father's family with throat cancer (smokers)  Lupus sister  Negative for respiratory diseases  Allergies brother    Social History:  Never smoker  Married  retired from [employer]          Objective:   Physical Exam    11/03/2013      132 > 11/07/2014  132 >  05/14/2015  134  Wt Readings from Last 3 Encounters:  07/20/13 135 lb 6.4 oz (61.417 kg)  12/10/12 135 lb 12.8 oz (61.598 kg)  09/16/12 134 lb (60.782 kg)    Ambulatory healthy appearing  Bf  Nasal tone / harsh cough HEENT: nl dentition, turbinates, and orophanx -   Neck without JVD/Nodes/TM/ no pain on flex/ext. No palp thyroid nodules  Lungs clear to A and P bilaterally without cough on insp or exp maneuvers  RRR no s3 or murmur or increase in P2  Nl pulses Abd not distended, no tenderness , BS +  No bruits or organomegaly , neg CVA tenderness   Ext warm without calf tenderness, cyanosis clubbing or edema MS nl gait no deformities/ restrictions -   Neuro nl sensorium, no motor or cerebellar def, no pathologic reflexes Skin: no rash     pCXR 05/19/14  No active disease.   Recent Labs Lab 11/07/14 0943  NA 139  K 4.4  CL 107  CO2 25  BUN 8  CREATININE 0.6   GLUCOSE 110*    Recent Labs Lab 11/07/14 0943  HGB 14.4  HCT 43.9  WBC 2.6*  PLT 188.0     Lab Results  Component Value Date   TSH 1.43 11/07/2014           Assessment & Plan:

## 2015-05-14 NOTE — Telephone Encounter (Signed)
Called pt and she c/o cough, wheezing, chest tx, sob. Has not been seen x December 2015. appt scheduled to see MW this afternoon. Nothing further needed

## 2015-05-14 NOTE — Patient Instructions (Addendum)
Max Vit D = 1000 units and calcium = 1500 mg daily - don't exceed this total  zpak   prilosec Take 2 puffs first thing in am and then another 2 puffs about 12 hours later.   For cough > hydromet 1-2 tsp every 4 hours as needed  GERD (REFLUX)  is an extremely common cause of respiratory symptoms just like yours , many times with no obvious heartburn at all.    It can be treated with medication, but also with lifestyle changes including elevation of the head of your bed (ideally with 6 inch  bed blocks),  Smoking cessation, avoidance of late meals, excessive alcohol, and avoid fatty foods, chocolate, peppermint, colas, red wine, and acidic juices such as orange juice.  NO MINT OR MENTHOL PRODUCTS SO NO COUGH DROPS  USE SUGARLESS CANDY INSTEAD (Jolley ranchers or Stover's or Life Savers) or even ice chips will also do - the key is to swallow to prevent all throat clearing. NO OIL BASED VITAMINS - use powdered substitutes.

## 2015-05-21 ENCOUNTER — Encounter: Payer: Self-pay | Admitting: Internal Medicine

## 2015-05-21 NOTE — Assessment & Plan Note (Addendum)
Recurrent in setting of past h/o atypical cp likely gerd related though this could have been triggered by uri or allergy so rec zpak/ hydromet     I had an extended discussion with the patient and her husband reviewing all relevant studies completed to date and  lasting 15 to 20 minutes of a 25 minute visit on the following ongoing concerns:   Explained the natural history of uri and why it's necessary in patients at risk to treat GERD aggressively - at least  short term -   to reduce risk of evolving cyclical cough initially  triggered by epithelial injury and a heightened sensitivty to the effects of any upper airway irritants,  most importantly acid - related - then perpetuated by epithelial injury related to the cough itself as the upper airway collapses on itself.  That is, the more sensitive the epithelium becomes once it is damaged by the virus, the more the ensuing irritability> the more the cough, the more the secondary reflux (especially in those prone to reflux) the more the irritation of the sensitive mucosa and so on in a  Classic cyclical pattern.    Each maintenance medication was reviewed in detail including most importantly the difference between maintenance and as needed and under what circumstances the prns are to be used.  Please see instructions for details which were reviewed in writing and the patient given a copy.

## 2015-08-01 ENCOUNTER — Telehealth: Payer: Self-pay | Admitting: Internal Medicine

## 2015-08-01 NOTE — Telephone Encounter (Signed)
lmtcb for pt.  

## 2015-08-02 NOTE — Telephone Encounter (Signed)
That's usually an othopedic problem, a pinched nerve from back problems and not something a neurologist can help with - could see a neurosurgeon if want an neurologic opinion but would probably take a lot longer to schedule, it's up to her.

## 2015-08-02 NOTE — Telephone Encounter (Signed)
[facility] whoever can see her first

## 2015-08-02 NOTE — Telephone Encounter (Signed)
Patient notified and will contact [facility] for an appointment Nothing further needed.

## 2015-08-02 NOTE — Telephone Encounter (Signed)
Fine with me but we don't have a dx on the problem list; see what her symptoms are and use this as dx

## 2015-08-02 NOTE — Telephone Encounter (Signed)
Called spoke with pt. She is requesting a referral to a urologists. She reports she spoke with MW regarding to this in the past. Please advise thanks

## 2015-08-02 NOTE — Telephone Encounter (Signed)
Called spoke with pt. She is willing to see an orthopedic. Please advise MW who you would like for pt to be referred to?

## 2015-08-02 NOTE — Telephone Encounter (Signed)
Called and spoke to pt. Pt requesting referral to neurologist (not urologist) for left sided sciatica causing left foot numbness and tingling.    Dr. Melvyn Novas please advise if ok for neuro referral. Thanks.

## 2015-08-02 NOTE — Telephone Encounter (Signed)
Pt returned call  [phone]

## 2015-09-25 ENCOUNTER — Other Ambulatory Visit: Payer: Self-pay | Admitting: Internal Medicine

## 2015-09-25 MED ORDER — ALPRAZOLAM 0.5 MG PO TABS: 0.5000 mg | ORAL_TABLET | Freq: Every day | ORAL | Status: DC | PRN

## 2015-11-13 ENCOUNTER — Encounter: Payer: Self-pay | Admitting: Internal Medicine

## 2015-11-13 ENCOUNTER — Ambulatory Visit (INDEPENDENT_AMBULATORY_CARE_PROVIDER_SITE_OTHER): Payer: BLUE CROSS/BLUE SHIELD | Admitting: Internal Medicine

## 2015-11-13 ENCOUNTER — Other Ambulatory Visit (INDEPENDENT_AMBULATORY_CARE_PROVIDER_SITE_OTHER): Payer: BLUE CROSS/BLUE SHIELD

## 2015-11-13 ENCOUNTER — Telehealth: Payer: Self-pay | Admitting: Internal Medicine

## 2015-11-13 VITALS — BP 120/84 | HR 78 | Ht 64.5 in | Wt 132.0 lb

## 2015-11-13 DIAGNOSIS — E785 Hyperlipidemia, unspecified: Secondary | ICD-10-CM

## 2015-11-13 DIAGNOSIS — H6122 Impacted cerumen, left ear: Secondary | ICD-10-CM

## 2015-11-13 DIAGNOSIS — Z0000 Encounter for general adult medical examination without abnormal findings: Secondary | ICD-10-CM

## 2015-11-13 LAB — LIPID PANEL
Cholesterol: 187 mg/dL (ref 0–200)
HDL: 71.5 mg/dL (ref >39.00)
LDL CALC: 103 mg/dL — AB (ref 0–99)
NonHDL: 115.11
TRIGLYCERIDES: 63 mg/dL (ref 0.0–149.0)
Total CHOL/HDL Ratio: 3
VLDL: 12.6 mg/dL (ref 0.0–40.0)

## 2015-11-13 LAB — CBC WITH DIFFERENTIAL/PLATELET
Basophils Absolute: 0 10*3/uL (ref 0.0–0.1)
Basophils Relative: 0.3 % (ref 0.0–3.0)
EOS ABS: 0 10*3/uL (ref 0.0–0.7)
Eosinophils Relative: 0.7 % (ref 0.0–5.0)
HCT: 44.3 % (ref 36.0–46.0)
Hemoglobin: 14.6 g/dL (ref 12.0–15.0)
Lymphocytes Relative: 66.2 % — ABNORMAL HIGH (ref 12.0–46.0)
Lymphs Abs: 2.1 10*3/uL (ref 0.7–4.0)
MCHC: 33.1 g/dL (ref 30.0–36.0)
MCV: 90.8 fl (ref 78.0–100.0)
MONO ABS: 0.2 10*3/uL (ref 0.1–1.0)
Monocytes Relative: 6.6 % (ref 3.0–12.0)
NEUTROS ABS: 0.8 10*3/uL — AB (ref 1.4–7.7)
Neutrophils Relative %: 26.2 % — ABNORMAL LOW (ref 43.0–77.0)
Platelets: 194 10*3/uL (ref 150.0–400.0)
RBC: 4.87 Mil/uL (ref 3.87–5.11)
RDW: 13.2 % (ref 11.5–15.5)
WBC: 3.1 10*3/uL — ABNORMAL LOW (ref 4.0–10.5)

## 2015-11-13 LAB — URINALYSIS
BILIRUBIN URINE: NEGATIVE
KETONES UR: NEGATIVE
Leukocytes, UA: NEGATIVE
NITRITE: NEGATIVE
PH: 5.5 (ref 5.0–8.0)
Specific Gravity, Urine: >=1.03 — AB (ref 1.000–1.030)
TOTAL PROTEIN, URINE-UPE24: NEGATIVE
URINE GLUCOSE: NEGATIVE
Urobilinogen, UA: 0.2 (ref 0.0–1.0)

## 2015-11-13 LAB — HEPATIC FUNCTION PANEL
ALT: 14 U/L (ref 0–35)
AST: 15 U/L (ref 0–37)
Albumin: 4.5 g/dL (ref 3.5–5.2)
Alkaline Phosphatase: 36 U/L — ABNORMAL LOW (ref 39–117)
BILIRUBIN DIRECT: 0.1 mg/dL (ref 0.0–0.3)
BILIRUBIN TOTAL: 0.9 mg/dL (ref 0.2–1.2)
TOTAL PROTEIN: 7.3 g/dL (ref 6.0–8.3)

## 2015-11-13 LAB — BASIC METABOLIC PANEL
BUN: 9 mg/dL (ref 6–23)
CALCIUM: 9.8 mg/dL (ref 8.4–10.5)
CO2: 29 mEq/L (ref 19–32)
Chloride: 107 mEq/L (ref 96–112)
Creatinine, Ser: 0.64 mg/dL (ref 0.40–1.20)
GFR: 120.98 mL/min (ref >60.00)
Glucose, Bld: 99 mg/dL (ref 70–99)
Potassium: 3.7 mEq/L (ref 3.5–5.1)
SODIUM: 143 meq/L (ref 135–145)

## 2015-11-13 LAB — TSH: TSH: 0.83 u[IU]/mL (ref 0.35–4.50)

## 2015-11-13 NOTE — Patient Instructions (Signed)
Flu shot today  If not able to clear the wax out of your Left ear with the over the counter solutions you will need to return to see Tammy NP for this  Please remember to go to the lab  department downstairs for your tests - we will call you with the results when they are available.     Please schedule a follow up visit in 12 months for cpx/ but call sooner if needed

## 2015-11-13 NOTE — Progress Notes (Signed)
Subjective:    Patient ID: Mariah Everett, female    DOB: [birth year]  MRN: [id number]    Brief patient profile:  60 yobf smoked only in HS with new cough since 2007 indolent onset assoc with sensation of pnds more day than night minimally productive esp in am, assoc with occ HB.  >>> resolved on ppi and decongestants.        History of Present Illness    11/13/2015  f/u ov/Crystalle Popwell re:  cpx   Followed for gerd/ chronic rhinitis Chief Complaint  Patient presents with  . Annual Exam    Pt is fasting. She is doing well and denies any co's today.      No  Sob or  cough or cp or chest tightness, subjective wheeze overt sinus or hb symptoms. No unusual exp hx    Sleeping ok without nocturnal  or early am exacerbation  of respiratory  c/o's or need for noct saba. Also denies any obvious fluctuation of symptoms with weather or environmental changes or other aggravating or alleviating factors except as outlined above   Current Medications, Allergies, Past Medical History, Past Surgical History, Family History, and Social History were reviewed in [employer] record.  ROS  The following are not active complaints unless bolded sore throat, dysphagia, dental problems, itching, sneezing,  nasal congestion or excess/ purulent secretions, ear ache,   fever, chills, sweats, unintended wt loss, pleuritic or exertional cp, hemoptysis,  orthopnea pnd or leg swelling, presyncope, palpitations, heartburn, abdominal pain, anorexia, nausea, vomiting, diarrhea  or change in bowel or urinary habits, change in stools or urine, dysuria,hematuria,  rash, arthralgias, visual complaints, headache, numbness weakness or ataxia or problems with walking or coordination,  change in mood/affect or memory.      Past Medical History:  Health Maintenance  - CPX  11/13/2015  - DT December 15, 2008  - DEXA 12/22/2008 T spine + .9 LFem -1.2, R Fem -.7 > repeat ordered 11/07/14  - GYN  Tavon Neck/shoulder  pain radicular C7/8 right  - MRI 12/22/2008 Pos C7 disc protrusion > Discectomy and fusion 05/18/2009 [facility] in [place]  Thyroid nodule  - MRI 12/22/08  - U/S and bx May 11, 2009 > c/w benign goiter, TSH nl so no rx  Dermatits ?eczema ..............................  Scales/ Durharm Relative chronic Neutropenia  - dx  2010  Rectal bleeeding...................................Marland Kitchen  Dr Margaret Pyle [facility] Ser - nl colonoscopy 08/2011     Family History:  Mother died from ovarian cancer  Father's family with throat cancer (smokers)  Lupus sister  Negative for respiratory diseases  Allergies brother    Social History:  Never smoker  Married  retired from [employer]          Objective:   Physical Exam  amb bf nad   / vital signs reviewed   11/03/2013      132 > 11/07/2014  132 >  05/14/2015  134 > 11/13/2015    132     07/20/13 135 lb 6.4 oz (61.417 kg)  12/10/12 135 lb 12.8 oz (61.598 kg)  09/16/12 134 lb (60.782 kg)    Ambulatory healthy appearing  Bf    HEENT: nl dentition, turbinates, and oropharynx  - L wax impaction dry    Neck without JVD/Nodes/TM/ no pain on flex/ext. No palp thyroid nodules  Lungs clear to A and P bilaterally without cough on insp or exp maneuvers  RRR no s3 or murmur or increase in P2  Nl pulses  Abd not distended, no tenderness , BS +  No bruits or organomegaly , neg CVA tenderness   Ext warm without calf tenderness, cyanosis clubbing or edema MS nl gait no deformities/ restrictions -   Neuro nl sensorium, no motor or cerebellar def, no pathologic reflexes Skin: no rash      Labs ordered/ reviewed:      Chemistry      Component Value Date/Time   NA 143 11/13/2015 1150   K 3.7 11/13/2015 1150   CL 107 11/13/2015 1150   CO2 29 11/13/2015 1150   BUN 9 11/13/2015 1150   CREATININE 0.64 11/13/2015 1150      Component Value Date/Time   CALCIUM 9.8 11/13/2015 1150   ALKPHOS 36* 11/13/2015 1150   AST 15 11/13/2015 1150   ALT 14 11/13/2015 1150   BILITOT  0.9 11/13/2015 1150        Lab Results  Component Value Date   WBC 3.1* 11/13/2015   HGB 14.6 11/13/2015   HCT 44.3 11/13/2015   MCV 90.8 11/13/2015   PLT 194.0 11/13/2015         Lab Results  Component Value Date   TSH 0.83 11/13/2015                                Assessment & Plan:

## 2015-11-13 NOTE — Telephone Encounter (Signed)
MW agree'd to see pt. appt scheduled.

## 2015-11-17 LAB — VITAMIN D 1,25 DIHYDROXY
Vitamin D 1, 25 (OH)2 Total: 80 pg/mL — ABNORMAL HIGH (ref 18–72)
Vitamin D3 1, 25 (OH)2: 80 pg/mL

## 2015-11-20 NOTE — Assessment & Plan Note (Signed)
Up to date in all respects/ f/u gyn issues Dr Edwyna Ready

## 2015-11-20 NOTE — Assessment & Plan Note (Signed)
Lab Results  Component Value Date   CHOL 187 11/13/2015   HDL 71.50 11/13/2015   LDLCALC 103* 11/13/2015   LDLDIRECT 112.3 11/03/2013   TRIG 63.0 11/13/2015   CHOLHDL 3 11/13/2015    She has very high hdl / no sign risk

## 2015-11-20 NOTE — Assessment & Plan Note (Signed)
Advised on use of otcs  > f/u NP prn

## 2016-01-03 ENCOUNTER — Telehealth: Payer: Self-pay | Admitting: Internal Medicine

## 2016-01-03 NOTE — Telephone Encounter (Signed)
Spoke with pt.   Immunization documentation updated.   Confirmed Dr. Melvyn Novas is pt's PCP. Nothing further needed.

## 2016-06-09 DIAGNOSIS — Z1151 Encounter for screening for human papillomavirus (HPV): Secondary | ICD-10-CM | POA: Diagnosis not present

## 2016-06-09 DIAGNOSIS — Z6821 Body mass index (BMI) 21.0-21.9, adult: Secondary | ICD-10-CM | POA: Diagnosis not present

## 2016-06-09 DIAGNOSIS — Z1231 Encounter for screening mammogram for malignant neoplasm of breast: Secondary | ICD-10-CM | POA: Diagnosis not present

## 2016-06-09 DIAGNOSIS — Z01419 Encounter for gynecological examination (general) (routine) without abnormal findings: Secondary | ICD-10-CM | POA: Diagnosis not present

## 2016-06-09 LAB — HM PAP SMEAR

## 2016-08-12 ENCOUNTER — Encounter: Payer: Self-pay | Admitting: Internal Medicine

## 2016-08-12 ENCOUNTER — Other Ambulatory Visit (INDEPENDENT_AMBULATORY_CARE_PROVIDER_SITE_OTHER): Payer: 59

## 2016-08-12 ENCOUNTER — Ambulatory Visit (INDEPENDENT_AMBULATORY_CARE_PROVIDER_SITE_OTHER): Payer: 59 | Admitting: Internal Medicine

## 2016-08-12 ENCOUNTER — Ambulatory Visit (INDEPENDENT_AMBULATORY_CARE_PROVIDER_SITE_OTHER)
Admission: RE | Admit: 2016-08-12 | Discharge: 2016-08-12 | Disposition: A | Discharge status: Home / Self Care | Payer: 59 | Source: Ambulatory Visit | Attending: Internal Medicine | Admitting: Internal Medicine

## 2016-08-12 ENCOUNTER — Encounter (INDEPENDENT_AMBULATORY_CARE_PROVIDER_SITE_OTHER): Payer: Self-pay

## 2016-08-12 VITALS — BP 126/80 | HR 100 | Temp 98.3°F | Ht 64.0 in | Wt 126.6 lb

## 2016-08-12 DIAGNOSIS — G4489 Other headache syndrome: Secondary | ICD-10-CM

## 2016-08-12 DIAGNOSIS — R51 Headache: Secondary | ICD-10-CM

## 2016-08-12 DIAGNOSIS — R6889 Other general symptoms and signs: Secondary | ICD-10-CM | POA: Diagnosis not present

## 2016-08-12 DIAGNOSIS — R519 Headache, unspecified: Secondary | ICD-10-CM | POA: Insufficient documentation

## 2016-08-12 DIAGNOSIS — R05 Cough: Secondary | ICD-10-CM | POA: Diagnosis not present

## 2016-08-12 DIAGNOSIS — R634 Abnormal weight loss: Secondary | ICD-10-CM

## 2016-08-12 HISTORY — DX: Abnormal weight loss: R63.4

## 2016-08-12 LAB — HEPATIC FUNCTION PANEL
ALBUMIN: 4.4 g/dL (ref 3.5–5.2)
ALT: 11 U/L (ref 0–35)
AST: 14 U/L (ref 0–37)
Alkaline Phosphatase: 37 U/L — ABNORMAL LOW (ref 39–117)
Bilirubin, Direct: 0.2 mg/dL (ref 0.0–0.3)
TOTAL PROTEIN: 7 g/dL (ref 6.0–8.3)
Total Bilirubin: 1 mg/dL (ref 0.2–1.2)

## 2016-08-12 LAB — CBC WITH DIFFERENTIAL/PLATELET
BASOS ABS: 0 10*3/uL (ref 0.0–0.1)
BASOS PCT: 0.2 % (ref 0.0–3.0)
Eosinophils Absolute: 0 10*3/uL (ref 0.0–0.7)
Eosinophils Relative: 0.2 % (ref 0.0–5.0)
HEMATOCRIT: 42.1 % (ref 36.0–46.0)
Hemoglobin: 14.3 g/dL (ref 12.0–15.0)
LYMPHS PCT: 48.6 % — AB (ref 12.0–46.0)
Lymphs Abs: 1.5 10*3/uL (ref 0.7–4.0)
MCHC: 33.8 g/dL (ref 30.0–36.0)
MCV: 88.9 fl (ref 78.0–100.0)
MONO ABS: 0.2 10*3/uL (ref 0.1–1.0)
Monocytes Relative: 6.3 % (ref 3.0–12.0)
NEUTROS ABS: 1.4 10*3/uL (ref 1.4–7.7)
Neutrophils Relative %: 44.7 % (ref 43.0–77.0)
Platelets: 182 10*3/uL (ref 150.0–400.0)
RBC: 4.74 Mil/uL (ref 3.87–5.11)
RDW: 12.5 % (ref 11.5–15.5)
WBC: 3.1 10*3/uL — ABNORMAL LOW (ref 4.0–10.5)

## 2016-08-12 LAB — BASIC METABOLIC PANEL
BUN: 5 mg/dL — AB (ref 6–23)
CALCIUM: 9.4 mg/dL (ref 8.4–10.5)
CHLORIDE: 107 meq/L (ref 96–112)
CO2: 30 meq/L (ref 19–32)
CREATININE: 0.6 mg/dL (ref 0.40–1.20)
GFR: 130.01 mL/min (ref >60.00)
Glucose, Bld: 100 mg/dL — ABNORMAL HIGH (ref 70–99)
Potassium: 3.8 mEq/L (ref 3.5–5.1)
Sodium: 142 mEq/L (ref 135–145)

## 2016-08-12 LAB — TSH: TSH: 0.93 u[IU]/mL (ref 0.35–4.50)

## 2016-08-12 LAB — SEDIMENTATION RATE: SED RATE: 3 mm/h (ref 0–30)

## 2016-08-12 NOTE — Patient Instructions (Addendum)
Please remember to go to the lab and x-ray department downstairs for your tests - we will call you with the results when they are available.  Please see patient coordinator before you leave today  to schedule primary care in [place] and [facility]  See your eye doctor about your blurred vision and your GI doctor about your nausea   It is critical that you sleep well at night - don't nap, avoid caffeine and read for at least 30 min before bed by a dull reading light but not so dull you have to squint   Follow up here can be as needed

## 2016-08-12 NOTE — Progress Notes (Signed)
Subjective:    Patient ID: Mariah Everett, female    DOB: 1956/08/07  MRN: [id number]    Brief patient profile:  32 yobf smoked only in HS with new cough since 2007 indolent onset assoc with sensation of pnds more day than night minimally productive esp in am, assoc with occ HB.  >>> resolved on ppi and decongestants.    CP w/u 05/19/14  With LHC  Nl coronary arteries        History of Present Illness   08/12/2016  acute ov/Ambrie Carte re: multiple complaints x 3-4 m with acute chest tightness on ppi prn  Chief Complaint  Patient presents with  . Acute Visit    Pt c/o chest tightness just since this am. She states she has felt nauseated and not focused for the past several days. She states has "cloudiness in head" and blurred vision in her right eye. She is also concnerned about loss of appetite and unintended wt loss.  not doing well for 3-4 months with neg GYN eval August 2017 by Edwyna Ready  All started with lack of sleep due to multiple health concerns from the top of her head to the bottom of her feet. She jumped from one part of her body to the other during the interview s ever answering any specific questions about any of her symptoms to point where the interview was of no value  No obvious day to day or daytime variability or assoc excess/ purulent sputum or mucus plugs or hemoptysis or   chest   subjective wheeze or overt sinus or hb symptoms. No unusual exp hx or h/o childhood pna/ asthma or knowledge of premature birth.  Sleeping very poorly though nocturnal  or early am exacerbation  of respiratory  c/o's or need for noct saba. Also denies any obvious fluctuation of symptoms with weather or environmental changes or other aggravating or alleviating factors except as outlined above   Current Medications, Allergies, Complete Past Medical History, Past Surgical History, Family History, and Social History were reviewed in [employer] record.  ROS  The following are not  active complaints unless bolded sore throat, dysphagia, dental problems, itching, sneezing,  nasal congestion or excess/ purulent secretions, ear ache,   fever, chills, sweats, unintended wt loss, classically pleuritic or exertional cp,  orthopnea pnd or leg swelling, presyncope, palpitations, abdominal pain, anorexia, nausea, vomiting, diarrhea  or change in bowel or bladder habits, change in stools or urine, dysuria,hematuria,  rash, arthralgias, visual complaints, headache, numbness, weakness or ataxia or problems with walking or coordination,  change in mood/affect or memory.            Past Medical History:  Health Maintenance  - CPX  11/13/2015  - DT December 15, 2008  - DEXA 12/22/2008 T spine + .9 LFem -1.2, R Fem -.7 > repeat ordered 11/07/14  - GYN  Tavon Neck/shoulder pain radicular C7/8 right  - MRI 12/22/2008 Pos C7 disc protrusion > Discectomy and fusion 05/18/2009 [facility] in [place]  Thyroid nodule  - MRI 12/22/08  - U/S and bx May 11, 2009 > c/w benign goiter, TSH nl so no rx  Dermatits ?eczema ..............................  Scales/ Durharm Relative chronic Neutropenia  - dx  2010  Rectal bleeeding...................................Marland Kitchen   Dr Margaret Pyle  Jule Ser - nl colonoscopy 08/2011     Family History:  Mother died from ovarian cancer  Father's family with throat cancer (smokers)  Lupus sister  Negative for respiratory diseases  Allergies brother  Social History:  Never smoker  Married  retired from [employer]          Objective:   Physical Exam  amb bf nad   / vital signs reviewed   11/03/2013      132 > 11/07/2014  132 >  05/14/2015  134 > 11/13/2015    132 > 08/12/2016 127     07/20/13 135 lb 6.4 oz (61.417 kg)  12/10/12 135 lb 12.8 oz (61.598 kg)  09/16/12 134 lb (60.782 kg)    Ambulatory healthy appearing  BF   HEENT: nl dentition, turbinates, and oropharynx    / mod ear wax bilateraly  Neck without JVD/Nodes/TM/ no pain on flex/ext. No palp thyroid  nodules  Lungs clear to A and P bilaterally without cough on insp or exp maneuvers  RRR no s3 or murmur or increase in P2  Nl pulses Abd not distended, no tenderness , BS +  No bruits or organomegaly , neg CVA tenderness   Ext warm without calf tenderness, cyanosis clubbing or edema MS nl gait no deformities/ restrictions -   Neuro nl sensorium, no motor or cerebellar def, no pathologic reflexes Skin: no rash      CXR PA and Lateral:   08/12/2016 :    I personally reviewed images and agree with radiology impression as follows:   No active cardiopulmonary disease.  Labs ordered/ reviewed:      Chemistry      Component Value Date/Time   NA 142 08/12/2016 1246   K 3.8 08/12/2016 1246   CL 107 08/12/2016 1246   CO2 30 08/12/2016 1246   BUN 5 (L) 08/12/2016 1246   CREATININE 0.60 08/12/2016 1246      Component Value Date/Time   CALCIUM 9.4 08/12/2016 1246   ALKPHOS 37 (L) 08/12/2016 1246   AST 14 08/12/2016 1246   ALT 11 08/12/2016 1246   BILITOT 1.0 08/12/2016 1246        Lab Results  Component Value Date   WBC 3.1 (L) 08/12/2016   HGB 14.3 08/12/2016   HCT 42.1 08/12/2016   MCV 88.9 08/12/2016   PLT 182.0 08/12/2016        Lab Results  Component Value Date   TSH 0.93 08/12/2016        Lab Results  Component Value Date   ESRSEDRATE 3 08/12/2016   ESRSEDRATE 8 12/10/2012                Assessment & Plan:

## 2016-08-13 ENCOUNTER — Other Ambulatory Visit: Payer: Self-pay | Admitting: Internal Medicine

## 2016-08-13 DIAGNOSIS — G4489 Other headache syndrome: Secondary | ICD-10-CM

## 2016-08-13 NOTE — Progress Notes (Signed)
Spoke with pt and notified of results per Dr. Wert. Pt verbalized understanding and denied any questions. 

## 2016-08-14 DIAGNOSIS — R6889 Other general symptoms and signs: Secondary | ICD-10-CM | POA: Insufficient documentation

## 2016-08-14 NOTE — Assessment & Plan Note (Addendum)
She's has far too many locations/ systems complaints that it is extremely unlikely we will find a unifying dx other than anxiety disorder and really needs to establish at this point with a PC but for now rec  CT head for HA's/ viz complaints GI eval for wt loss/ nausea Work on better sleep hygiene/ benzo's as last resort    I had an extended discussion with the patient reviewing all relevant studies completed to date and  lasting 15 to 20 minutes of a 25 minute visit    Each maintenance medication was reviewed in detail including most importantly the difference between maintenance and prns and under what circumstances the prns are to be triggered using an action plan format that is not reflected in the computer generated alphabetically organized AVS.    Please see instructions for details which were reviewed in writing and the patient given a copy highlighting the part that I personally wrote and discussed at today's ov.

## 2016-08-14 NOTE — Assessment & Plan Note (Signed)
Suspect this is related to anxiety > since assoc with nausea rec GI reeval/ continue ppi in meantime

## 2016-08-14 NOTE — Assessment & Plan Note (Signed)
Non specific pattern, nl ESR > rec Heat and sinus CT to be complete

## 2016-08-20 ENCOUNTER — Inpatient Hospital Stay: Admission: RE | Admit: 2016-08-20 | Payer: 59 | Source: Ambulatory Visit

## 2016-08-20 ENCOUNTER — Ambulatory Visit (INDEPENDENT_AMBULATORY_CARE_PROVIDER_SITE_OTHER)
Admission: RE | Admit: 2016-08-20 | Discharge: 2016-08-20 | Disposition: A | Discharge status: Home / Self Care | Payer: 59 | Source: Ambulatory Visit | Attending: Internal Medicine | Admitting: Internal Medicine

## 2016-08-20 DIAGNOSIS — G4489 Other headache syndrome: Secondary | ICD-10-CM | POA: Diagnosis not present

## 2016-08-20 DIAGNOSIS — R51 Headache: Secondary | ICD-10-CM | POA: Diagnosis not present

## 2016-08-21 ENCOUNTER — Telehealth: Payer: Self-pay | Admitting: Internal Medicine

## 2016-08-21 NOTE — Progress Notes (Signed)
LMTCB

## 2016-08-21 NOTE — Telephone Encounter (Signed)
Notes Recorded by Tanda Rockers, MD on 08/21/2016 at 5:36 AM EDT Call patient : Study is unremarkable, no change in recs -------------- Spoke with pt, aware of results/recs.  Nothing further needed.

## 2016-08-25 ENCOUNTER — Ambulatory Visit (INDEPENDENT_AMBULATORY_CARE_PROVIDER_SITE_OTHER): Payer: 59 | Admitting: Family Medicine

## 2016-08-25 ENCOUNTER — Encounter: Payer: Self-pay | Admitting: Family Medicine

## 2016-08-25 VITALS — BP 126/90 | HR 90 | Temp 98.4°F | Ht 64.0 in | Wt 127.0 lb

## 2016-08-25 DIAGNOSIS — R030 Elevated blood-pressure reading, without diagnosis of hypertension: Secondary | ICD-10-CM | POA: Diagnosis not present

## 2016-08-25 DIAGNOSIS — J302 Other seasonal allergic rhinitis: Secondary | ICD-10-CM | POA: Diagnosis not present

## 2016-08-25 NOTE — Progress Notes (Signed)
Chief Complaint  Patient presents with  . Establish Care       New Patient Visit SUBJECTIVE: HPI: Mariah Everett is an 60 y.o.female who is being seen for establishing care. Here with husband.  The patient has not had a primary in a while.  No hx of high blood pressure. She is anxious and is in pain stemming from a dental procedure.  Seasonal allergies For many years, she has been experiencing nasal congestion, sinus pressure, and watering eyes around the same time of year. It usually starts towards the end of Aug and beginning of Sept and resolves by Nov/Dec. She uses Flonase intermittently. Does not take PO antihistamines. No fevers.   No Known Allergies  Past Medical History:  Diagnosis Date  . Chronic benign neutropenia (HCC)   . Neck pain    radicular C7/8 right. MRI 12-22-18 pos C7 disc protrusion  . Shoulder pain   . Thyroid nodule    a. Bx/TFTs normal by prior workup, clinically benign.   Past Surgical History:  Procedure Laterality Date  . BIOPSY THYROID    . CERVICAL DISCECTOMY  05-18-2009  . CERVICAL FUSION  05-18-2009  . LEFT HEART CATHETERIZATION WITH CORONARY ANGIOGRAM N/A 05/19/2014   Procedure: LEFT HEART CATHETERIZATION WITH CORONARY ANGIOGRAM;  Surgeon: Jolaine Artist, MD;  Location: [facility] CATH LAB;  Service: Cardiovascular;  Laterality: N/A;   Social History   Social History  . Marital status: Married   Occupational History  . insurance    Social History Main Topics  . Smoking status: Never Smoker  . Smokeless tobacco: Never Used   Family History  Problem Relation Age of Onset  . Ovarian cancer Mother   . Throat cancer Father   . Lupus Sister      Current Outpatient Prescriptions:  .  Calcium Carb-Cholecalciferol (CALCIUM PLUS VITAMIN D3 PO), Take 1 tablet by mouth daily., Disp: , Rfl:  .  Cholecalciferol (VITAMIN D3) 5000 units TABS, Take 1 tablet by mouth daily., Disp: , Rfl:  .  fexofenadine (ALLEGRA) 180 MG tablet, Take 180 mg by mouth  daily as needed for allergies or rhinitis., Disp: , Rfl:  .  omeprazole (PRILOSEC OTC) 20 MG tablet, Take 20 mg by mouth daily as needed. , Disp: , Rfl:  .  Probiotic Product (PROBIOTIC DAILY) CAPS, Take 1 capsule by mouth daily as needed. , Disp: , Rfl:  .  TURMERIC PO, Take 1,000 mg by mouth daily., Disp: , Rfl:   No LMP recorded. Patient is postmenopausal.  ROS HEENT: As noted in HPI  Respiratory: Denies dyspnea   OBJECTIVE: BP 126/90 (BP Location: Left Arm, Patient Position: Sitting, Cuff Size: Normal)   Pulse 90   Temp 98.4 F (36.9 C) (Oral)   Ht 5\' 4"  (1.626 m)   Wt 127 lb (57.6 kg)   SpO2 99%   BMI 21.80 kg/m   Constitutional: -  VS reviewed -  Well developed, well nourished, appears stated age -  No apparent distress  Psychiatric: -  Oriented to person, place, and time -  Memory intact -  Affect and mood normal -  Fluent conversation, good eye contact -  Judgment and insight age appropriate  Eye: -  Conjunctivae clear, no discharge -  Pupils symmetric, round, reactive to light  ENMT: -  Ears are patent b/l without erythema or discharge. TM's are shiny and clear b/l without evidence of effusion or infection. -  R turbinate is enlarged compared to the left, slightly  pale b/l, no D/C -  Oral mucosa without lesions, tongue and uvula midline    Tonsils not enlarged, no erythema, no exudate, trachea midline    Pharynx moist, no lesions, no erythema  Neck: -  No gross swelling, no palpable masses -  Thyroid midline, not enlarged, mobile, no palpable masses  Cardiovascular: -  RRR, no murmurs -  No LE edema  Respiratory: -  Normal respiratory effort, no accessory muscle use, no retraction -  Breath sounds equal, no wheezes, no ronchi, no crackles  Skin: -  No significant lesion on inspection -  Warm and dry to palpation   ASSESSMENT/PLAN: Seasonal allergic rhinitis, unspecified chronicity, unspecified trigger  Elevated blood pressure reading  Patient instructed to  sign release of records form from her previous PCP. Patient should return in 4 weeks for a nurse BP visit. The patient voiced understanding and agreement to the plan.   [place], DO 08/25/16  5:07 PM

## 2016-08-25 NOTE — Progress Notes (Signed)
Pre visit review using our clinic review tool, if applicable. No additional management support is needed unless otherwise documented below in the visit note. 

## 2016-08-25 NOTE — Patient Instructions (Signed)
Claritin (loratadine), Allegra (fexofenadine), Zyrtec (cetirizine); these are listed in order from weakest to strongest. Generic, and therefore cheaper, options are in the parentheses.   Flonase (fluticasone); nasal spray that is over the counter. 2 sprays each nostril, once daily. Aim towards the same side eye when you spray.  There are available OTC, and the generic versions, which may be cheaper, are in parentheses. Show this to a pharmacist if you have trouble finding any of these items.  

## 2016-09-23 ENCOUNTER — Ambulatory Visit (INDEPENDENT_AMBULATORY_CARE_PROVIDER_SITE_OTHER): Payer: 59 | Admitting: Family Medicine

## 2016-09-23 VITALS — BP 122/82 | HR 94

## 2016-09-23 DIAGNOSIS — R030 Elevated blood-pressure reading, without diagnosis of hypertension: Secondary | ICD-10-CM | POA: Diagnosis not present

## 2016-09-23 NOTE — Progress Notes (Signed)
Pre visit review using our clinic tool,if applicable. No additional management support is needed unless otherwise documented below in the visit note.   Patient in for BP check. BP= 122/82 P=94

## 2016-09-24 NOTE — Progress Notes (Signed)
Patient ID: Mariah Everett, female    DOB: 11/16/1956  Age: 60 y.o. MRN: [id number]    Subjective:  Subjective  HPI MELISSASUE REINHOLD presents for bp check  No complaints  Review of Systems  Constitutional: Negative for appetite change, diaphoresis, fatigue and unexpected weight change.  Eyes: Negative for pain, redness and visual disturbance.  Respiratory: Negative for cough, chest tightness, shortness of breath and wheezing.   Cardiovascular: Negative for chest pain, palpitations and leg swelling.  Endocrine: Negative for cold intolerance, heat intolerance, polydipsia, polyphagia and polyuria.  Genitourinary: Negative for difficulty urinating, dysuria and frequency.  Neurological: Negative for dizziness, light-headedness, numbness and headaches.    History Past Medical History:  Diagnosis Date  . Chronic benign neutropenia (HCC)   . Neck pain    radicular C7/8 right. MRI 12-22-18 pos C7 disc protrusion  . Shoulder pain   . Thyroid nodule    a. Bx/TFTs normal by prior workup, clinically benign.    She has a past surgical history that includes Cervical discectomy (05-18-2009); Cervical fusion (05-18-2009); Biopsy thyroid; and left heart catheterization with coronary angiogram (N/A, 05/19/2014).   Her family history includes Lupus in her sister; Ovarian cancer in her mother; Throat cancer in her father.She reports that she has never smoked. She has never used smokeless tobacco. Her alcohol and drug histories are not on file.  Current Outpatient Prescriptions on File Prior to Visit  Medication Sig Dispense Refill  . Calcium Carb-Cholecalciferol (CALCIUM PLUS VITAMIN D3 PO) Take 1 tablet by mouth daily.    . Cholecalciferol (VITAMIN D3) 5000 units TABS Take 1 tablet by mouth daily.    . fexofenadine (ALLEGRA) 180 MG tablet Take 180 mg by mouth daily as needed for allergies or rhinitis.    Marland Kitchen omeprazole (PRILOSEC OTC) 20 MG tablet Take 20 mg by mouth daily as needed.     . Probiotic  Product (PROBIOTIC DAILY) CAPS Take 1 capsule by mouth daily as needed.     . TURMERIC PO Take 1,000 mg by mouth daily.     No current facility-administered medications on file prior to visit.      Objective:  Objective  Physical Exam  Constitutional: She is oriented to person, place, and time. She appears well-developed and well-nourished.  HENT:  Head: Normocephalic and atraumatic.  Eyes: Conjunctivae and EOM are normal.  Neck: Normal range of motion. Neck supple. No JVD present. Carotid bruit is not present. No thyromegaly present.  Cardiovascular: Normal rate, regular rhythm and normal heart sounds.   No murmur heard. Pulmonary/Chest: Effort normal and breath sounds normal. No respiratory distress. She has no wheezes. She has no rales. She exhibits no tenderness.  Musculoskeletal: She exhibits no edema.  Neurological: She is alert and oriented to person, place, and time.  Psychiatric: She has a normal mood and affect.  Nursing note and vitals reviewed.  BP 122/82   Pulse 94  Wt Readings from Last 3 Encounters:  08/25/16 127 lb (57.6 kg)  08/12/16 126 lb 9.6 oz (57.4 kg)  11/13/15 132 lb (59.9 kg)     Lab Results  Component Value Date   WBC 3.1 (L) 08/12/2016   HGB 14.3 08/12/2016   HCT 42.1 08/12/2016   PLT 182.0 08/12/2016   GLUCOSE 100 (H) 08/12/2016   CHOL 187 11/13/2015   TRIG 63.0 11/13/2015   HDL 71.50 11/13/2015   LDLDIRECT 112.3 11/03/2013   LDLCALC 103 (H) 11/13/2015   ALT 11 08/12/2016   AST 14 08/12/2016  NA 142 08/12/2016   K 3.8 08/12/2016   CL 107 08/12/2016   CREATININE 0.60 08/12/2016   BUN 5 (L) 08/12/2016   CO2 30 08/12/2016   TSH 0.93 08/12/2016   INR 1.03 05/19/2014   HGBA1C 5.7 (H) 05/19/2014    Ct Head Wo Contrast  Result Date: 08/20/2016 CLINICAL DATA:  Headache with right eye blurring, chronic EXAM: CT HEAD WITHOUT CONTRAST TECHNIQUE: Contiguous axial images were obtained from the base of the skull through the vertex without  intravenous contrast. COMPARISON:  None. FINDINGS: Brain: The ventricles are normal in size and configuration. There is no intracranial mass, hemorrhage, extra-axial fluid collection, or midline shift. Gray-white compartments are normal. No acute infarct evident. Vascular: There is no hyperdense vessels. There is no evident vascular calcification. Skull: Bony calvarium appears intact. Sinuses/Orbits: Visualized paranasal sinuses are clear. Visualized orbits appear symmetric bilaterally. Other: Visualized mastoid air cells are clear. IMPRESSION: Study within normal limits. Electronically Signed   By: Lowella Grip III M.D.   On: 08/20/2016 11:12     Assessment & Plan:  Plan  I am having Ms. Throgmorton maintain her fexofenadine, PROBIOTIC DAILY, omeprazole, Calcium Carb-Cholecalciferol (CALCIUM PLUS VITAMIN D3 PO), TURMERIC PO, and Vitamin D3.  No orders of the defined types were placed in this encounter.   Problem List Items Addressed This Visit    None    Visit Diagnoses    Elevated blood pressure reading    -  Primary    blood pressure was good today Dash diet  Exercise rto 3 months or sooner prn   Follow-up: Return in about 3 months (around 12/24/2016).  Ann Held, DO

## 2016-10-14 DIAGNOSIS — R300 Dysuria: Secondary | ICD-10-CM | POA: Diagnosis not present

## 2016-10-14 DIAGNOSIS — N898 Other specified noninflammatory disorders of vagina: Secondary | ICD-10-CM | POA: Diagnosis not present

## 2016-10-14 DIAGNOSIS — N390 Urinary tract infection, site not specified: Secondary | ICD-10-CM | POA: Diagnosis not present

## 2016-10-14 DIAGNOSIS — K644 Residual hemorrhoidal skin tags: Secondary | ICD-10-CM | POA: Diagnosis not present

## 2016-10-14 MED FILL — ME-NAPHOS-MB-HYO 1 TABLET: 81.6 | 6 days supply | Qty: 24 | Fill #0

## 2016-10-14 MED FILL — NITROFURANTOIN MONO-MCR 100: 100 | 7 days supply | Qty: 14 | Fill #0

## 2016-11-13 ENCOUNTER — Ambulatory Visit: Payer: BLUE CROSS/BLUE SHIELD | Admitting: Internal Medicine

## 2016-12-17 DIAGNOSIS — H5203 Hypermetropia, bilateral: Secondary | ICD-10-CM | POA: Diagnosis not present

## 2017-01-09 ENCOUNTER — Encounter: Payer: Self-pay | Admitting: Family Medicine

## 2017-01-09 ENCOUNTER — Ambulatory Visit (INDEPENDENT_AMBULATORY_CARE_PROVIDER_SITE_OTHER): Payer: 59 | Admitting: Family Medicine

## 2017-01-09 VITALS — BP 144/84 | HR 93 | Temp 98.1°F | Ht 65.0 in | Wt 125.4 lb

## 2017-01-09 DIAGNOSIS — R002 Palpitations: Secondary | ICD-10-CM

## 2017-01-09 DIAGNOSIS — R059 Cough, unspecified: Secondary | ICD-10-CM

## 2017-01-09 DIAGNOSIS — R05 Cough: Secondary | ICD-10-CM

## 2017-01-09 DIAGNOSIS — R030 Elevated blood-pressure reading, without diagnosis of hypertension: Secondary | ICD-10-CM

## 2017-01-09 LAB — BASIC METABOLIC PANEL
BUN: 10 mg/dL (ref 6–23)
CHLORIDE: 104 meq/L (ref 96–112)
CO2: 28 mEq/L (ref 19–32)
CREATININE: 0.64 mg/dL (ref 0.40–1.20)
Calcium: 9.5 mg/dL (ref 8.4–10.5)
GFR: 120.52 mL/min (ref >60.00)
Glucose, Bld: 92 mg/dL (ref 70–99)
Potassium: 3.6 mEq/L (ref 3.5–5.1)
Sodium: 139 mEq/L (ref 135–145)

## 2017-01-09 LAB — CBC
HEMATOCRIT: 42.1 % (ref 36.0–46.0)
Hemoglobin: 14.2 g/dL (ref 12.0–15.0)
MCHC: 33.7 g/dL (ref 30.0–36.0)
MCV: 90.7 fl (ref 78.0–100.0)
Platelets: 195 10*3/uL (ref 150.0–400.0)
RBC: 4.65 Mil/uL (ref 3.87–5.11)
RDW: 12.9 % (ref 11.5–15.5)
WBC: 3.1 10*3/uL — ABNORMAL LOW (ref 4.0–10.5)

## 2017-01-09 LAB — TSH: TSH: 1.77 u[IU]/mL (ref 0.35–4.50)

## 2017-01-09 LAB — MAGNESIUM: MAGNESIUM: 1.8 mg/dL (ref 1.5–2.5)

## 2017-01-09 NOTE — Progress Notes (Signed)
Chief Complaint  Patient presents with  . Cough    Pt reports cough with tightness in chest and palpitations x1 week     Subjective: Patient is a 61 y.o. female here for cough.  This has been going on for 2 weeks. She has associated chest tightness and palpitations. She thinks the former  is related to the coughing.  She has been getting palpitations for the past 10 days, lasting several seconds and taking place every 1-2 days. Her chest tightness/discomfort is on the L side of her chest under her breast. It is not related to exertion. She does have a hx of GERD and it has been flaring up more than usual.  She also has a history of allergies and feels that she has a post nasal drip as well. Denies fevers, shaking, myalgias, shortness of breath, calf pain, nasal congestion, ST, coughing up blood.  She did recently return from the [facility] note, she had been feeling well lately and is only using Turmeric and Vit D.   ROS: Heart: +chest pain and palpitations (neither are current) Lungs: Denies SOB, +cough  Family History  Problem Relation Age of Onset  . Ovarian cancer Mother   . Throat cancer Father   . Lupus Sister    Past Medical History:  Diagnosis Date  . Chronic benign neutropenia (HCC)   . Neck pain    radicular C7/8 right. MRI 12-22-18 pos C7 disc protrusion  . Shoulder pain   . Thyroid nodule    a. Bx/TFTs normal by prior workup, clinically benign.   No Known Allergies  Current Outpatient Prescriptions:  .  Cholecalciferol (VITAMIN D3) 5000 units TABS, Take 1 tablet by mouth daily., Disp: , Rfl:  .  fexofenadine (ALLEGRA) 180 MG tablet, Take 180 mg by mouth daily as needed for allergies or rhinitis., Disp: , Rfl:  .  TURMERIC PO, Take 1,000 mg by mouth daily., Disp: , Rfl:   Objective: BP (!) 144/84 (BP Location: Left Arm, Patient Position: Sitting, Cuff Size: Small)   Pulse 93   Temp 98.1 F (36.7 C) (Oral)   Ht 5\' 5"  (1.651 m)   Wt 125 lb 6.4 oz  (56.9 kg)   SpO2 98%   BMI 20.87 kg/m  General: Awake, appears stated age HEENT: MMM, pharynx neg, EOMi, no sinus tenderness, nares patent, no D/C, ears patent, TM's neg b/l Heart: RRR, no murmurs, no LE edema Lungs: CTAB, no rales, wheezes or rhonchi. No accessory muscle use Abd: BS+, soft, mild TTP in epigastric region, ND, no masses or organomegaly MSK: no calf tenderness Psych: Age appropriate judgment and insight, normal affect and mood  Assessment and Plan: Palpitations - Plan: TSH, Magnesium, Basic Metabolic Panel (BMET), CBC, EKG 12-Lead  Cough  Elevated blood pressure reading  Orders as above. Go back on fexofenadine. If no improvement, start anti-reflux medicine.  Need to check home BP readings and bring them to next appt.  Keep diary, written or mental, for palpitations. EKG shows NSR, no change from 10/2015 EKG. No tachycardia.   CP is atypical, likely 2/2 cough. Exertional symptoms discussed and to seek care if these arise. Despite recent travel, given other findings, PE is unlikely. I will see her in 2 weeks. If she is still having issues in setting of neg labs, will refer to cardio vs order Holter monitor. Will also see if she needs to go on BP meds. The patient voiced understanding and agreement to the plan.  [employer]  Mariah Ebbs, DO 01/09/17  7:46 AM

## 2017-01-09 NOTE — Patient Instructions (Addendum)
Go back on your fexofenadine until this clears up. If no improvement in the next 2-3 days, start the Prilosec. An alternative to the Prilosec is Zantac (ranitidine) or Pepcid (famotidine).   While you wait for your labs to come back, you could try taking 400 mg of Magnesium, which is available over the counter.  Your EKG is normal. It looks the same as your most recent one in 2016.  Around 3 times per week, check your blood pressure 4 times per day. Twice in the morning and twice in the evening. The readings should be at least one minute apart. Write down these values and bring them to your next appointment.  When you check your BP, make sure you have been doing something calm/relaxing 5 minutes prior to checking. Both feet should be flat on the floor and you should be sitting. Use your left arm and make sure it is in a relaxed position (on a table), and that the cuff is at the approximate level/height of your heart.  If you start having chest pain and/or shortness of breath when you physically exert yourself, let us know or seek immediate care.  When you have palpitations, try to remember or write down if you are having associated dizziness/light-headedness, chest pain, shortness of breath or other symptoms. Also see if you can determine if anything tends to cause this.

## 2017-01-09 NOTE — Progress Notes (Signed)
Pre visit review using our clinic review tool, if applicable. No additional management support is needed unless otherwise documented below in the visit note. 

## 2017-01-26 ENCOUNTER — Encounter: Payer: Self-pay | Admitting: Family Medicine

## 2017-01-26 ENCOUNTER — Ambulatory Visit (INDEPENDENT_AMBULATORY_CARE_PROVIDER_SITE_OTHER): Payer: 59 | Admitting: Family Medicine

## 2017-01-26 VITALS — BP 108/80 | HR 82 | Temp 98.0°F | Ht 65.0 in | Wt 128.2 lb

## 2017-01-26 DIAGNOSIS — R002 Palpitations: Secondary | ICD-10-CM | POA: Diagnosis not present

## 2017-01-26 NOTE — Progress Notes (Signed)
Chief Complaint  Patient presents with  . Follow-up    on BP    Subjective Mariah Everett is a 61 y.o. female who presents for elevated BP follow up (elevated at last visit around 2 weeks ago) She does monitor home blood pressures. Blood pressures ranging from 110-130's/70-80's on average. She is not on any medications. She is adhering to a healthy diet overall. Current exercise: walking daily  She has not had palpitations for around 10 days. She did start taking Mg daily. Unsure if this is helpful. Maintaining good PO intake of fluids. Her labs were unremarkable. She has been trying to keep a symptom log. No pain or SOB.   Past Medical History:  Diagnosis Date  . Chronic benign neutropenia (HCC)   . Neck pain    radicular C7/8 right. MRI 12-22-18 pos C7 disc protrusion  . Shoulder pain   . Thyroid nodule    a. Bx/TFTs normal by prior workup, clinically benign.   Family History  Problem Relation Age of Onset  . Ovarian cancer Mother   . Throat cancer Father   . Lupus Sister      Medications Current Outpatient Prescriptions on File Prior to Visit  Medication Sig Dispense Refill  . Cholecalciferol (VITAMIN D3) 5000 units TABS Take 1 tablet by mouth daily.    . fexofenadine (ALLEGRA) 180 MG tablet Take 180 mg by mouth daily as needed for allergies or rhinitis.    . TURMERIC PO Take 1,000 mg by mouth daily.     Allergies No Known Allergies  Review of Systems Cardiovascular: no chest pain, +palpitations Respiratory:  no shortness of breath  Exam BP 108/80 (BP Location: Left Arm, Patient Position: Sitting, Cuff Size: Normal)   Pulse 82   Temp 98 F (36.7 C) (Oral)   Ht 5\' 5"  (1.651 m)   Wt 128 lb 3.2 oz (58.2 kg)   SpO2 99%   BMI 21.33 kg/m  General:  well developed, well nourished, in no apparent distress Skin:  warm, no pallor or diaphoresis Eyes:  pupils equal and round, sclera anicteric without injection Heart :RRR, no murmurs, no bruits, no LE edema Lungs:   clear to auscultation, no accessory muscle use Psych: well oriented with normal range of affect and appropriate judgment/insight  Palpitations  No BP interventions needed. For palpitations, will watchfully wait. If she starts having again, will order a Holter.  Counseled on diet and exercise F/u prn. The patient voiced understanding and agreement to the plan.  [place], DO 01/26/17  11:18 AM

## 2017-01-26 NOTE — Progress Notes (Signed)
Pre visit review using our clinic review tool, if applicable. No additional management support is needed unless otherwise documented below in the visit note. 

## 2017-01-26 NOTE — Patient Instructions (Addendum)
If you start having palpitations again, let our office know and I will order the Holter Monitor. You do not need to make an appointment for this. If anything else changes, I want to see you.  Your blood pressure is terrific today.  I would not recommend taking a baby aspirin daily at this point.

## 2017-04-30 ENCOUNTER — Telehealth: Payer: Self-pay | Admitting: Internal Medicine

## 2017-04-30 NOTE — Telephone Encounter (Signed)
Spoke with pt. We do not have any appointments on Monday or Tuesday. Pt has been scheduled to see MW on 05/01/17 at 10am. She will call us and let us know if this will not work with her schedule. Nothing further was needed.

## 2017-05-01 ENCOUNTER — Ambulatory Visit (INDEPENDENT_AMBULATORY_CARE_PROVIDER_SITE_OTHER)
Admission: RE | Admit: 2017-05-01 | Discharge: 2017-05-01 | Disposition: A | Discharge status: Home / Self Care | Payer: 59 | Source: Ambulatory Visit | Attending: Internal Medicine | Admitting: Internal Medicine

## 2017-05-01 ENCOUNTER — Ambulatory Visit (INDEPENDENT_AMBULATORY_CARE_PROVIDER_SITE_OTHER): Payer: 59 | Admitting: Internal Medicine

## 2017-05-01 ENCOUNTER — Encounter: Payer: Self-pay | Admitting: Internal Medicine

## 2017-05-01 ENCOUNTER — Other Ambulatory Visit (INDEPENDENT_AMBULATORY_CARE_PROVIDER_SITE_OTHER): Payer: 59

## 2017-05-01 VITALS — BP 142/86 | HR 91 | Ht 65.0 in | Wt 124.0 lb

## 2017-05-01 DIAGNOSIS — R05 Cough: Secondary | ICD-10-CM | POA: Diagnosis not present

## 2017-05-01 DIAGNOSIS — R058 Other specified cough: Secondary | ICD-10-CM

## 2017-05-01 LAB — CBC WITH DIFFERENTIAL/PLATELET
Basophils Absolute: 0 10*3/uL (ref 0.0–0.1)
Basophils Relative: 0.2 % (ref 0.0–3.0)
EOS ABS: 0 10*3/uL (ref 0.0–0.7)
EOS PCT: 0.7 % (ref 0.0–5.0)
HCT: 44.7 % (ref 36.0–46.0)
HEMOGLOBIN: 14.9 g/dL (ref 12.0–15.0)
LYMPHS PCT: 61.5 % — AB (ref 12.0–46.0)
Lymphs Abs: 1.9 10*3/uL (ref 0.7–4.0)
MCHC: 33.3 g/dL (ref 30.0–36.0)
MCV: 89.6 fl (ref 78.0–100.0)
Monocytes Absolute: 0.3 10*3/uL (ref 0.1–1.0)
Monocytes Relative: 8.8 % (ref 3.0–12.0)
Neutro Abs: 0.9 10*3/uL — ABNORMAL LOW (ref 1.4–7.7)
Neutrophils Relative %: 28.8 % — ABNORMAL LOW (ref 43.0–77.0)
Platelets: 232 10*3/uL (ref 150.0–400.0)
RBC: 4.99 Mil/uL (ref 3.87–5.11)
RDW: 13.1 % (ref 11.5–15.5)
WBC: 3.1 10*3/uL — AB (ref 4.0–10.5)

## 2017-05-01 LAB — NITRIC OXIDE: NITRIC OXIDE: 10

## 2017-05-01 MED ORDER — BENZONATATE 200 MG PO CAPS: 200.0000 mg | ORAL_CAPSULE | Freq: Three times a day (TID) | ORAL | 2 refills | Status: DC | PRN

## 2017-05-01 MED ORDER — PANTOPRAZOLE SODIUM 40 MG PO TBEC: 40.0000 mg | DELAYED_RELEASE_TABLET | Freq: Every day | ORAL | 2 refills | Status: DC

## 2017-05-01 MED ORDER — FAMOTIDINE 20 MG PO TABS: ORAL_TABLET | ORAL | 2 refills | Status: DC

## 2017-05-01 MED FILL — FAMOTIDINE 20 MG TABLET: 20 | 30 days supply | Qty: 30 | Fill #0

## 2017-05-01 MED FILL — PANTOPRAZOLE SOD DR 40 MG T: 40 | 90 days supply | Qty: 90 | Fill #0

## 2017-05-01 MED FILL — BENZONATATE 200 MG CAP: 200 | 15 days supply | Qty: 45 | Fill #0

## 2017-05-01 NOTE — Assessment & Plan Note (Signed)
Onset 2007  - CT sinus (part of head ct) 08/20/16 : Visualized paranasal sinuses are clear. Visualized orbits appear symmetric bilaterally - FENO 05/01/2017  =   10  - Allergy profile 05/01/2017 >  Eos 0. /  IgE   Jerrye Bushy rx/ 1st gen H1 x 6 weeks trial 05/01/2017 >>>   Recurrent pattern mostly daytime cough worse with voice use with nl FENO is c/w Upper airway cough syndrome (previously labeled PNDS) , is  so named because it's frequently impossible to sort out how much is  CR/sinusitis with freq throat clearing (which can be related to primary GERD)   vs  causing  secondary (" extra esophageal")  GERD from wide swings in gastric pressure that occur with throat clearing, often  promoting self use of mint and menthol lozenges that reduce the lower esophageal sphincter tone and exacerbate the problem further in a cyclical fashion.   These are the same pts (now being labeled as having "irritable larynx syndrome" by some cough centers) who not infrequently have a history of having failed to tolerate ace inhibitors,  dry powder inhalers or biphosphonates or report having atypical/extraesophageal reflux symptoms that don't respond to standard doses of PPI  and are easily confused as having aecopd or asthma flares by even experienced allergists/ pulmonologists (myself included).   Of the three most common causes of  Sub-acute or recurrent or chronic cough, only one (GERD)  can actually contribute to/ trigger  the other two (asthma and post nasal drip syndrome)  and perpetuate the cylce of cough.  While not intuitively obvious, many patients with chronic low grade reflux do not cough until there is a primary insult that disturbs the protective epithelial barrier and exposes sensitive nerve endings.   This is typically viral but can be direct physical injury such as with an endotracheal tube.   The point is that once this occurs, it is difficult to eliminate the cycle  using anything but a maximally effective acid  suppression regimen at least in the short run, accompanied by an appropriate diet to address non acid GERD and control / eliminate the cough itself for at least 3 days.   rec  Max gerd RX / 1st gen H1 for pnds/  control cyclical cough and then return in 6 weeks ? May need gabapentin trial next    Advised pt and husband The standardized cough guidelines published in Chest by Lissa Morales in 2006 are still the best available and consist of a multiple step process (up to 12!) , not a single office visit,  and are intended  to address this problem logically,  with an alogrithm dependent on response to empiric treatment at  each progressive step  to determine a specific diagnosis with  minimal addtional testing needed. Therefore if adherence is an issue or can't be accurately verified,  it's very unlikely the standard evaluation and treatment will be successful here.    Furthermore, response to therapy (other than acute cough suppression, which should only be used short term with avoidance of narcotic containing cough syrups if possible), can be a gradual process for which the patient is not likely to  perceive immediate benefit.  Unlike going to an eye doctor where the best perscription is almost always the first one and is immediately effective, this is almost never the case in the management of chronic cough syndromes. Therefore the patient needs to commit up front to consistently adhere to recommendations  for up to 6  weeks of therapy directed at the likely underlying problem(s) before the response can be reasonably evaluated.   So needs to return  with all meds in hand using a trust but verify approach to confirm accurate Medication  Reconciliation The principal here is that until we are certain that the  patients are doing what we've asked, it makes no sense to ask them to do more.    I had an extended discussion with the patient reviewing all relevant studies completed to date and  lasting 25  minutes of a 40  minute acutre   visit addressing severe non-specific but potentially very serious refractory respiratory symptoms of uncertain and potentially multiple  etiologies.   Each maintenance medication was reviewed in detail including most importantly the difference between maintenance and prns and under what circumstances the prns are to be triggered using an action plan format that is not reflected in the computer generated alphabetically organized AVS.    Please see AVS for specific instructions unique to this office visit that I personally wrote and verbalized to the the pt in detail and then reviewed with pt  by my nurse highlighting any changes in therapy/plan of care  recommended at today's visit.

## 2017-05-01 NOTE — Progress Notes (Signed)
Spoke with pt and notified of results per Dr. Wert. Pt verbalized understanding and denied any questions. 

## 2017-05-01 NOTE — Patient Instructions (Addendum)
Pantoprazole (protonix) 40 mg   Take  30-60 min before first meal of the day and Pepcid (famotidine)  20 mg one after supper - take both automatically  until return to office - this is the best way to tell whether stomach acid is contributing to your problem.    For cough take the Tussin max amt allowed on bottom with goal of completely eliminating urge to cough   If need something stronger than tussin during the day try tessalon 200 mg three times a day   For drainage / throat tickle try take CHLORPHENIRAMINE  4 mg - take one every 4 hours as needed - available over the counter- may cause drowsiness so start with just a bedtime dose or two and see how you tolerate it before trying in daytime    GERD (REFLUX)  is an extremely common cause of respiratory symptoms just like yours , many times with no obvious heartburn at all.    It can be treated with medication, but also with lifestyle changes including elevation of the head of your bed (ideally with 6 inch  bed blocks),  Smoking cessation, avoidance of late meals, excessive alcohol, and avoid fatty foods, chocolate, peppermint, colas, red wine, and acidic juices such as orange juice.  NO MINT OR MENTHOL PRODUCTS SO NO COUGH DROPS   USE SUGARLESS CANDY INSTEAD (Jolley ranchers or Stover's or Life Savers) or even ice chips will also do - the key is to swallow to prevent all throat clearing. NO OIL BASED VITAMINS - use powdered substitutes.  Please remember to go to the lab and x-ray department downstairs in the basement  for your tests - we will call you with the results when they are available.  Please schedule a follow up office visit in 6 weeks, call sooner if needed with all medications /inhalers/ solutions in hand so we can verify exactly what you are taking. This includes all medications from all doctors and over the counters

## 2017-05-01 NOTE — Progress Notes (Signed)
Subjective:    Patient ID: Mariah Everett, female    DOB: 03/28/1956  MRN: [id number]    Brief patient profile:  4  yobf smoked only in HS with new cough since 2007 indolent onset assoc with sensation of pnds more day than night minimally productive esp in am, assoc with occ HB.  >>> resolved on ppi and decongestants.    CP w/u 05/19/14  With LHC  Nl coronary arteries     History of Present Illness  05/01/2017 acute extended ov/[facility] re: recurrent cough  Chief Complaint  Patient presents with  . Acute Visit    Pt states she has been coughing for the past 3 months.  She states that her chest feels heavy. She has beeing coughing up some yellow sputum. She states that she can not take a full breath without trying.    Cough has been coming and going x 2011 persisted daily since April 2018 seems some better while on prilosec last took it about 10 days prior to [place].  Cough does not wake her up but rather after stirs worse with voice use/ by 6 pm  No > 1 tsp mucoid sputum, no pattern as to when most productive  Not limited by breathing from desired activities  Unless coughing but sometimes so severe almost feels like choking   No obvious day to day or daytime variability or assoc excess/ purulent sputum or mucus plugs or hemoptysis or cp or chest tightness, subjective wheeze or overt sinus or hb symptoms. No unusual exp hx or h/o childhood pna/ asthma or knowledge of premature birth.  Sleeping ok without nocturnal  or early am exacerbation  of respiratory  c/o's or need for noct saba. Also denies any obvious fluctuation of symptoms with weather or environmental changes or other aggravating or alleviating factors except as outlined above   Current Medications, Allergies, Complete Past Medical History, Past Surgical History, Family History, and Social History were reviewed in [employer] record.  ROS  The following are not active complaints unless bolded sore throat,  dysphagia, dental problems, itching, sneezing,  nasal congestion or excess/ purulent secretions, ear ache,   fever, chills, sweats, unintended wt loss, classically pleuritic or exertional cp,  orthopnea pnd or leg swelling, presyncope, palpitations, abdominal pain, anorexia, nausea, vomiting, diarrhea  or change in bowel or bladder habits, change in stools or urine, dysuria,hematuria,  rash, arthralgias, visual complaints, headache, numbness, weakness or ataxia or problems with walking or coordination,  change in mood/affect or memory.                      Past Medical History:  Health Maintenance  - CPX  11/13/2015  - DT December 15, 2008  - DEXA 12/22/2008 T spine + .9 LFem -1.2, R Fem -.7 > repeat ordered 11/07/14  - GYN  [place] Neck/shoulder pain radicular C7/8 right  - MRI 12/22/2008 Pos C7 disc protrusion > Discectomy and fusion 05/18/2009 [facility] in [place]  Thyroid nodule  - MRI 12/22/08  - U/S and bx May 11, 2009 > c/w benign goiter, TSH nl so no rx  Dermatits ?eczema ..............................  Scales/ [facility] Relative chronic Neutropenia  - dx  2010  Rectal bleeeding...................................Marland Kitchen   Dr Margaret Pyle  Jule Ser - nl colonoscopy 08/2011     Family History:  Mother died from ovarian cancer  Father's family with throat cancer (smokers)  Lupus sister  Negative for respiratory diseases  Allergies brother    Social  History:  Never smoker  Married  retired from [employer]          Objective:   Physical Exam  amb bf nad   / vital signs reviewed - Note on arrival 02 sats  98% on RA    11/03/2013      132 > 11/07/2014  132 >  05/14/2015  134 > 11/13/2015    132 > 08/12/2016 127 >  05/01/2017  124     07/20/13 135 lb 6.4 oz (61.417 kg)  12/10/12 135 lb 12.8 oz (61.598 kg)  09/16/12 134 lb (60.782 kg)     HEENT: nl dentition, turbinates bilaterally, and oropharynx which is pristine. Nl external ear canals without cough reflex   NECK :  without JVD/Nodes/TM/  nl carotid upstrokes bilaterally   LUNGS: no acc muscle use,  Nl contour chest which is clear to A and P bilaterally with cough on early  insp  Maneuver   CV:  RRR  no s3 or murmur or increase in P2, and no edema   ABD:  soft and nontender with nl inspiratory excursion in the supine position. No bruits or organomegaly appreciated, bowel sounds nl  MS:  Nl gait/ ext warm without deformities, calf tenderness, cyanosis or clubbing No obvious joint restrictions   SKIN: warm and dry without lesions    NEURO:  alert, approp, nl sensorium with  no motor or cerebellar deficits apparent.        CXR PA and Lateral:   05/01/2017 :    I personally reviewed images and agree with radiology impression as follows:      Labs ordered 05/01/2017  Allergy profile          Assessment & Plan:

## 2017-05-04 LAB — RESPIRATORY ALLERGY PROFILE REGION II ~~LOC~~
Allergen, C. Herbarum, M2: <0.1 kU/L
Allergen, Comm Silver Birch, t9: <0.1 kU/L
Allergen, Cottonwood, t14: <0.1 kU/L
Allergen, D pternoyssinus,d7: <0.1 kU/L
Allergen, Mulberry, t76: <0.1 kU/L
Allergen, P. notatum, m1: <0.1 kU/L
Aspergillus fumigatus, m3: <0.1 kU/L
Bermuda Grass: <0.1 kU/L
Box Elder IgE: <0.1 kU/L
Cockroach: <0.1 kU/L
Common Ragweed: <0.1 kU/L
D. farinae: <0.1 kU/L
Dog Dander: <0.1 kU/L
Elm IgE: <0.1 kU/L
IgE (Immunoglobulin E), Serum: <2 kU/L (ref <115)
Johnson Grass: <0.1 kU/L

## 2017-05-05 NOTE — Progress Notes (Signed)
Spoke with patient and informed her of results. She verbalized understanding and did not have any questions. Nothing further is needed.

## 2017-05-28 DIAGNOSIS — B373 Candidiasis of vulva and vagina: Secondary | ICD-10-CM | POA: Diagnosis not present

## 2017-05-28 DIAGNOSIS — R102 Pelvic and perineal pain: Secondary | ICD-10-CM | POA: Diagnosis not present

## 2017-05-28 MED FILL — FLUCONAZOLE 150 MG TABLET: 150 | 3 days supply | Qty: 2 | Fill #0

## 2017-06-01 DIAGNOSIS — S1090XA Unspecified superficial injury of unspecified part of neck, initial encounter: Secondary | ICD-10-CM | POA: Diagnosis not present

## 2017-06-15 ENCOUNTER — Ambulatory Visit: Payer: 59 | Admitting: Internal Medicine

## 2017-08-12 ENCOUNTER — Encounter: Payer: Self-pay | Admitting: Family Medicine

## 2017-08-12 ENCOUNTER — Ambulatory Visit (INDEPENDENT_AMBULATORY_CARE_PROVIDER_SITE_OTHER): Payer: 59 | Admitting: Family Medicine

## 2017-08-12 VITALS — BP 122/80 | HR 90 | Temp 98.1°F | Ht 65.0 in | Wt 125.1 lb

## 2017-08-12 DIAGNOSIS — Z0000 Encounter for general adult medical examination without abnormal findings: Secondary | ICD-10-CM

## 2017-08-12 DIAGNOSIS — Z114 Encounter for screening for human immunodeficiency virus [HIV]: Secondary | ICD-10-CM | POA: Diagnosis not present

## 2017-08-12 DIAGNOSIS — Z1159 Encounter for screening for other viral diseases: Secondary | ICD-10-CM

## 2017-08-12 LAB — COMPREHENSIVE METABOLIC PANEL
ALBUMIN: 4.6 g/dL (ref 3.5–5.2)
ALT: 15 U/L (ref 0–35)
AST: 18 U/L (ref 0–37)
Alkaline Phosphatase: 36 U/L — ABNORMAL LOW (ref 39–117)
BILIRUBIN TOTAL: 0.9 mg/dL (ref 0.2–1.2)
BUN: 9 mg/dL (ref 6–23)
CALCIUM: 9.8 mg/dL (ref 8.4–10.5)
CHLORIDE: 104 meq/L (ref 96–112)
CO2: 30 meq/L (ref 19–32)
Creatinine, Ser: 0.63 mg/dL (ref 0.40–1.20)
GFR: 122.5 mL/min (ref >60.00)
Glucose, Bld: 96 mg/dL (ref 70–99)
Potassium: 4.3 mEq/L (ref 3.5–5.1)
Sodium: 141 mEq/L (ref 135–145)
Total Protein: 7.2 g/dL (ref 6.0–8.3)

## 2017-08-12 LAB — CBC
HCT: 43.1 % (ref 36.0–46.0)
HEMOGLOBIN: 14.5 g/dL (ref 12.0–15.0)
MCHC: 33.6 g/dL (ref 30.0–36.0)
MCV: 91.8 fl (ref 78.0–100.0)
PLATELETS: 187 10*3/uL (ref 150.0–400.0)
RBC: 4.7 Mil/uL (ref 3.87–5.11)
RDW: 13.2 % (ref 11.5–15.5)
WBC: 2.7 10*3/uL — ABNORMAL LOW (ref 4.0–10.5)

## 2017-08-12 LAB — LIPID PANEL
CHOL/HDL RATIO: 2
CHOLESTEROL: 205 mg/dL — AB (ref 0–200)
HDL: 103.4 mg/dL (ref >39.00)
LDL CALC: 90 mg/dL (ref 0–99)
NonHDL: 101.19
TRIGLYCERIDES: 57 mg/dL (ref 0.0–149.0)
VLDL: 11.4 mg/dL (ref 0.0–40.0)

## 2017-08-12 NOTE — Patient Instructions (Addendum)
Heat (pad or rice pillow in microwave) over affected area, 10-15 minutes every 2-3 hours while awake.   Ibuprofen 400-600 mg (2-3 over the counter strength tabs) every 6 hours as needed for pain.  Make sure to stretch your pectoral muscles. Yoga is a good option.  Aim to do some physical exertion for 150 minutes per week. This is typically divided into 5 days per week, 30 minutes per day. The activity should be enough to get your heart rate up. Anything is better than nothing if you have time constraints.  Give [place] 2-3 business days to get the results of your labs back. If labs are normal, you will likely receive a letter in the mail unless you have MyChart. This can take longer than 2-3 business days.

## 2017-08-12 NOTE — Progress Notes (Signed)
Pre visit review using our clinic review tool, if applicable. No additional management support is needed unless otherwise documented below in the visit note. 

## 2017-08-12 NOTE — Progress Notes (Signed)
Chief Complaint  Patient presents with  . Annual Exam     Well Woman Mariah Everett is here for a complete physical.   Her last physical was >1 year ago.  Current diet: in general, a "healthy" diet. Current exercise: active around house; intermittent stretching and walking. Weight is stable and she denies daytime fatigue. No LMP recorded. Patient is postmenopausal..  Home safety concerns? No.  Seatbelt? Yes  Health Maintenance Pap/HPV- Yes Mammogram- Yes - scheduled in Oct Tetanus- Yes Hep C- No  HIV- No  Past Medical History:  Diagnosis Date  . Chronic benign neutropenia (HCC)   . Neck pain    radicular C7/8 right. MRI 12-22-18 pos C7 disc protrusion  . Shoulder pain   . Thyroid nodule    a. Bx/TFTs normal by prior workup, clinically benign.    Past Surgical History:  Procedure Laterality Date  . BIOPSY THYROID    . CERVICAL DISCECTOMY  05-18-2009  . CERVICAL FUSION  05-18-2009  . LEFT HEART CATHETERIZATION WITH CORONARY ANGIOGRAM N/A 05/19/2014   Procedure: LEFT HEART CATHETERIZATION WITH CORONARY ANGIOGRAM;  Surgeon: Jolaine Artist, MD;  Location[facility] CATH LAB;  Service: Cardiovascular;  Laterality: N/A;   Medications  Current Outpatient Prescriptions on File Prior to Visit  Medication Sig Dispense Refill  . Cholecalciferol (VITAMIN D3) 5000 units TABS Take 1 tablet by mouth daily.    . TURMERIC PO Take 1,000 mg by mouth daily.     Allergies No Known Allergies  Review of Systems: Constitutional:  no unexpected change in weight, no weakness, no unexplained fevers, sweats, or chills Eye:  no recent significant change in vision Ear/Nose/Mouth/Throat:  Ears:  no tinnitus or vertigo and no recent change in hearing, Nose/Mouth/Throat:  no complaints of nasal congestion or discharge, no sore throat and no recent change in voice or hoarseness Cardiovascular:  no exercise intolerance, no chest pain, no palpitations Respiratory:  no chronic cough, sputum, or hemoptysis  and no shortness of breath Gastrointestinal:  no abdominal pain, no change in bowel habits, no significant change in appetite, no nausea, vomiting, diarrhea, or constipation and no black or bloody stool GU:  Female: negative for dysuria, frequency, and incontinence, Normal menses; no abnormal bleeding, pelvic pain, or discharge Musculoskeletal/Extremities: +upper back pain; otherwise no pain, redness, or swelling of the joints Integumentary (Skin/Breast):  no abnormal skin lesions reported, no new breast lumps or masses; 2 lesions on back Neurologic:  no chronic headaches, no numbness, tingling, or tremor Psychiatric:  no anxiety, no depression Endocrine:  denies fatigue, weight changes, heat/cold intolerance, bowel or skin changes, or cardiovascular system symptoms Hematologic/Lymphatic:  no abnormal bleeding, no HIV risk factors, no night sweats, no swollen nodes, no weight loss Allergic/Immunologic:  no history of food or environmental allergies  Exam BP 122/80 (BP Location: Left Arm, Patient Position: Sitting, Cuff Size: Normal)   Pulse 90   Temp 98.1 F (36.7 C) (Oral)   Ht 5\' 5"  (1.651 m)   Wt 125 lb 2 oz (56.8 kg)   SpO2 98%   BMI 20.82 kg/m  General:  well developed, well nourished, in no apparent distress Skin: Skin tag on R side of back, there is a hyperpigmented slightly elevated lesion on the L upper back, 5 mm x 5 mm, disruption of normal border at 2 o'clock; otherwise no significant moles, warts, or growths Head:  no masses, lesions, or tenderness Eyes:  pupils equal and round, sclera anicteric without injection Ears:  canals without lesions,  TMs shiny without retraction, no obvious effusion, no erythema Nose:  nares patent, septum midline, mucosa normal, and no drainage or sinus tenderness Throat/Pharynx:  lips and gingiva without lesion; tongue and uvula midline; non-inflamed pharynx; no exudates or postnasal drainage Neck: neck supple without adenopathy, thyromegaly, or  masses Breasts:  Not examined Thorax:  nontender Lungs:  clear to auscultation, breath sounds equal bilaterally, no respiratory distress Cardio:  regular rate and rhythm without murmurs, heart sounds without clicks or rubs, point of maximal impulse normal; no lifts, heaves, or thrills Abdomen:  abdomen soft, nontender; bowel sounds normal; no masses or organomegaly Genital: Not examined Musculoskeletal:  symmetrical muscle groups noted without atrophy or deformity Extremities:  no clubbing, cyanosis, or edema, no deformities, no skin discoloration Neuro:  gait normal; deep tendon reflexes normal and symmetric Psych: well oriented with normal range of affect and appropriate judgment/insight  Assessment and Plan  Well adult exam - Plan: CBC, Comprehensive metabolic panel, Lipid panel  Screening for HIV (human immunodeficiency virus) - Plan: HIV antibody  Encounter for hepatitis C screening test for low risk patient - Plan: Hepatitis C antibody   Well 61 y.o. female. Counseled on diet and exercise. Challenged her to lift weights. Other orders as above. Discussed DEXA for documented osteopenia, she would like to check with insurance co first.  Follow up in 1-2 weeks for skin biopsy, yearly for CPE after that. The patient voiced understanding and agreement to the plan.  [place], DO 08/12/17 10:55 AM

## 2017-08-13 LAB — HEPATITIS C ANTIBODY
Hepatitis C Ab: NONREACTIVE
SIGNAL TO CUT-OFF: 0.01 (ref <1.00)

## 2017-08-13 LAB — HIV ANTIBODY (ROUTINE TESTING W REFLEX): HIV: NONREACTIVE

## 2017-09-04 MED FILL — FAMOTIDINE 20 MG TABLET: 20 | 30 days supply | Qty: 30 | Fill #1

## 2017-09-22 DIAGNOSIS — Z1231 Encounter for screening mammogram for malignant neoplasm of breast: Secondary | ICD-10-CM | POA: Diagnosis not present

## 2017-09-22 DIAGNOSIS — Z01419 Encounter for gynecological examination (general) (routine) without abnormal findings: Secondary | ICD-10-CM | POA: Diagnosis not present

## 2017-09-22 DIAGNOSIS — Z6821 Body mass index (BMI) 21.0-21.9, adult: Secondary | ICD-10-CM | POA: Diagnosis not present

## 2017-09-30 ENCOUNTER — Ambulatory Visit (INDEPENDENT_AMBULATORY_CARE_PROVIDER_SITE_OTHER): Payer: 59 | Admitting: Family Medicine

## 2017-09-30 ENCOUNTER — Encounter: Payer: Self-pay | Admitting: Family Medicine

## 2017-09-30 VITALS — BP 112/62 | HR 85 | Temp 98.0°F | Ht 65.0 in | Wt 125.5 lb

## 2017-09-30 DIAGNOSIS — S76311A Strain of muscle, fascia and tendon of the posterior muscle group at thigh level, right thigh, initial encounter: Secondary | ICD-10-CM | POA: Diagnosis not present

## 2017-09-30 DIAGNOSIS — G8929 Other chronic pain: Secondary | ICD-10-CM

## 2017-09-30 DIAGNOSIS — S86811A Strain of other muscle(s) and tendon(s) at lower leg level, right leg, initial encounter: Secondary | ICD-10-CM

## 2017-09-30 DIAGNOSIS — M25511 Pain in right shoulder: Secondary | ICD-10-CM

## 2017-09-30 MED ORDER — METHYLPREDNISOLONE 4 MG PO TBPK: ORAL_TABLET | ORAL | 0 refills | Status: DC

## 2017-09-30 MED FILL — METHYLPREDNISOLONE 4 MG TAB: 4 | 6 days supply | Qty: 21 | Fill #0

## 2017-09-30 NOTE — Progress Notes (Signed)
Musculoskeletal Exam  Patient: Mariah Everett DOB: 11/18/1956  DOS: 09/30/2017  SUBJECTIVE:  Chief Complaint:   Chief Complaint  Patient presents with  . Leg Pain    right    [name] is a 61 y.o.  female for evaluation and treatment of RLE pain.   Onset:  1 week ago. Sudden, .  Location: L post-lat thigh and leg; started in leg Character:  aching  Progression of issue:  has worsened slightly Associated symptoms: none Treatment: to date has been OTC NSAIDS.   Neurovascular symptoms: no  R shoulder issue since going to [place] earlier in the year. It will grind when she shrugs her shoulders, starting to become painful.   ROS: Musculoskeletal/Extremities: +L leg pain Neurologic: no numbness, tingling no weakness   Past Medical History:  Diagnosis Date  . Chronic benign neutropenia (HCC)   . Neck pain    radicular C7/8 right. MRI 12-22-18 pos C7 disc protrusion  . Shoulder pain   . Thyroid nodule    a. Bx/TFTs normal by prior workup, clinically benign.   Past Surgical History:  Procedure Laterality Date  . BIOPSY THYROID    . CERVICAL DISCECTOMY  05-18-2009  . CERVICAL FUSION  05-18-2009   Family History  Problem Relation Age of Onset  . Ovarian cancer Mother   . Throat cancer Father   . Lupus Sister    Current Outpatient Medications  Medication Sig Dispense Refill  . Cholecalciferol (VITAMIN D3) 5000 units TABS Take 1 tablet by mouth daily.    . TURMERIC PO Take 1,000 mg by mouth daily.     No Known Allergies   Objective: VITAL SIGNS: BP 112/62 (BP Location: Left Arm, Patient Position: Sitting, Cuff Size: Normal)   Pulse 85   Temp 98 F (36.7 C) (Oral)   Ht 5\' 5"  (1.651 m)   Wt 125 lb 8 oz (56.9 kg)   SpO2 98%   BMI 20.88 kg/m  Constitutional: Well formed, well developed. No acute distress. Cardiovascular: Brisk cap refill Thorax & Lungs: No accessory muscle use Extremities: No clubbing. No cyanosis. No edema.  Skin: Warm. Dry. No erythema.  No rash.  Musculoskeletal: RLE.   Normal active range of motion: yes.   Normal passive range of motion: yes Tenderness to palpation: Yes; over lateral calf and lateral hamstring Deformity: no Ecchymosis: no Tests positive: none Tests negative: Homan's +TTP elicited with resisted plantar flexion R shoulder- good ROM, some crepitus over the [facility] jt Neurologic: Normal sensory function. No focal deficits noted. DTR's equal and symmetry in LE's. No clonus. Psychiatric: Normal mood. Age appropriate judgment and insight. Alert & oriented x 3.    Assessment:  Strain of calf muscle, right, initial encounter  Strain of right hamstring, initial encounter  Chronic right shoulder pain  Plan: Steroid, heat, stretches/exercises for above.  No evidence of DVT, compartment syndrome, neurologic deficit, or infection. F/u in 4 weeks if needed. The patient voiced understanding and agreement to the plan.   [place], DO 09/30/17  11:44 AM

## 2017-09-30 NOTE — Patient Instructions (Signed)
Stretching and range of motion exercises These exercises warm up your muscles and joints and improve the movement and flexibility of your lower leg. These exercises also help to relieve pain and stiffness.  Exercise A: Gastrocnemius stretch 1. Sit with your left / right leg extended. 2. Loop a belt or towel around the ball of your left / right foot. The ball of your foot is on the walking surface, right under your toes. 3. Hold both ends of the belt or towel. 4. Keep your left / right ankle and foot relaxed and keep your knee straight while you use the belt or towel to pull your foot and ankle toward you. Stop at the first point of resistance. 5. Hold this position for 15-20 seconds. Repeat 2-3 times. Complete this exercise 1-2 times a day.  Exercise B: Ankle alphabet 1. Sit with your left / right leg supported at the lower leg. ? Do not rest your foot on anything. ? Make sure your foot has room to move freely. 2. Think of your left / right foot as a paintbrush, and move your foot to trace each letter of the alphabet in the air. Keep your hip and knee still while you trace. 3. Trace every letter from A to Z. Repeat 2-3 times. Complete this exercise 1-2 times a day.  Strengthening exercises These exercises build strength and endurance in your lower leg. Endurance is the ability to use your muscles for a long time, even after they get tired.  Exercise C: Plantar flexors with band 1. Sit with your left / right leg extended. 2. Loop a rubber exercise band or tube around the ball of your left / right foot. The ball of your foot is on the walking surface, right under your toes. 3. While holding both ends of the band or tube, slowly point your toes downward, pushing them away from you. 4. Hold this position for 15-20 seconds. 5. Slowly return your foot to the starting position. Repeat 2-3 times. Complete this exercise 1 time a day.  Exercise D: Plantar flexors, standing 1. Stand with your  feet shoulder-width apart. 2. Place your hands on a wall or table to steady yourself as needed, but try not to use it very much for support. 3. Rise up on your toes. 4. If this exercise is too easy, try these options: ? Shift your weight toward your left / right leg until you feel challenged. ? If told by your health care provider, stand on your left / right foot only. 5. Hold this position for 15-20 seconds. Repeat 2-3 times. Complete this exercise 1 time a day.  Exercise E: Plantar flexors, eccentric 1. Stand on the balls of your feet on the edge of a step. The ball of your foot is on the walking surface, right under your toes. 2. Place your hands on a wall or railing for balance as needed, but try not to lean on it for support. 3. Rise up on your toes, using both legs to help. 4. Slowly shift all of your weight to your left / right foot and lift your other foot off the step. 5. Slowly lower your left / right heel so it drops below the level of the step. You will feel a slight stretch in your left / right calf. 6. Put your other foot back onto the step. Repeat 3-4 times. Complete this exercise 1 time a day. This information is not intended to replace advice given to you by your  health care provider. Make sure you discuss any questions you have with your health care provider.  EXERCISES  RANGE OF MOTION (ROM) AND STRETCHING EXERCISES These exercises may help you when beginning to rehabilitate your injury. Your symptoms may resolve with or without further involvement from your physician, physical therapist or athletic trainer. While completing these exercises, remember:   Restoring tissue flexibility helps normal motion to return to the joints. This allows healthier, less painful movement and activity.  An effective stretch should be held for at least 30 seconds.  A stretch should never be painful. You should only feel a gentle lengthening or release in the stretched tissue.  ROM -  Pendulum  Bend at the waist so that your right / left arm falls away from your body. Support yourself with your opposite hand on a solid surface, such as a table or a countertop.  Your right / left arm should be perpendicular to the ground. If it is not perpendicular, you need to lean over farther. Relax the muscles in your right / left arm and shoulder as much as possible.  Gently sway your hips and trunk so they move your right / left arm without any use of your right / left shoulder muscles.  Progress your movements so that your right / left arm moves side to side, then forward and backward, and finally, both clockwise and counterclockwise.  Complete 10-15 repetitions in each direction. Many people use this exercise to relieve discomfort in their shoulder as well as to gain range of motion. Repeat 2-3 times. Complete this exercise once per day.  STRETCH - Flexion, Standing  Stand with good posture. With an underhand grip on your right / left hand and an overhand grip on the opposite hand, grasp a broomstick or cane so that your hands are a little more than shoulder-width apart.  Keeping your right / left elbow straight and shoulder muscles relaxed, push the stick with your opposite hand to raise your right / left arm in front of your body and then overhead. Raise your arm until you feel a stretch in your right / left shoulder, but before you have increased shoulder pain.  Try to avoid shrugging your right / left shoulder as your arm rises by keeping your shoulder blade tucked down and toward your mid-back spine. Hold 15-20 seconds.  Slowly return to the starting position. Repeat 2-3 times. Complete this exercise once per day.  STRETCH - Internal Rotation  Place your right / left hand behind your back, palm-up.  Throw a towel or belt over your opposite shoulder. Grasp the towel/belt with your right / left hand.  While keeping an upright posture, gently pull up on the towel/belt until  you feel a stretch in the front of your right / left shoulder.  Avoid shrugging your right / left shoulder as your arm rises by keeping your shoulder blade tucked down and toward your mid-back spine.  Hold 15-20. Release the stretch by lowering your opposite hand. Repeat 2-3 times. Complete this exercise once per day.  STRETCH - External Rotation and Abduction  Stagger your stance through a doorframe. It does not matter which foot is forward.  As instructed by your physician, physical therapist or athletic trainer, place your hands: ? And forearms above your head and on the door frame. ? And forearms at head-height and on the door frame. ? At elbow-height and on the door frame.  Keeping your head and chest upright and your stomach muscles  tight to prevent over-extending your low-back, slowly shift your weight onto your front foot until you feel a stretch across your chest and/or in the front of your shoulders.  Hold 15-20 seconds. Shift your weight to your back foot to release the stretch. Repeat 2-3 times. Complete this stretch once per day.   STRENGTHENING EXERCISES  These exercises may help you when beginning to rehabilitate your injury. They may resolve your symptoms with or without further involvement from your physician, physical therapist or athletic trainer. While completing these exercises, remember:   Muscles can gain both the endurance and the strength needed for everyday activities through controlled exercises.  Complete these exercises as instructed by your physician, physical therapist or athletic trainer. Progress the resistance and repetitions only as guided.  You may experience muscle soreness or fatigue, but the pain or discomfort you are trying to eliminate should never worsen during these exercises. If this pain does worsen, stop and make certain you are following the directions exactly. If the pain is still present after adjustments, discontinue the exercise until you  can discuss the trouble with your clinician.  If advised by your physician, during your recovery, avoid activity or exercises which involve actions that place your right / left hand or elbow above your head or behind your back or head. These positions stress the tissues which are trying to heal.  STRENGTH - Scapular Depression and Adduction  With good posture, sit on a firm chair. Supported your arms in front of you with pillows, arm rests or a table top. Have your elbows in line with the sides of your body.  Gently draw your shoulder blades down and toward your mid-back spine. Gradually increase the tension without tensing the muscles along the top of your shoulders and the back of your neck.  Hold for 5 seconds. Slowly release the tension and relax your muscles completely before completing the next repetition.  After you have practiced this exercise, remove the arm support and complete it in standing as well as sitting. Repeat 2 times. Complete this exercise every other day.   STRENGTH - External Rotators  Secure a rubber exercise band/tubing to a fixed object so that it is at the same height as your right / left elbow when you are standing or sitting on a firm surface.  Stand or sit so that the secured exercise band/tubing is at your side that is not injured.  Bend your elbow 90 degrees. Place a folded towel or small pillow under your right / left arm so that your elbow is a few inches away from your side.  Keeping the tension on the exercise band/tubing, pull it away from your body, as if pivoting on your elbow. Be sure to keep your body steady so that the movement is only coming from your shoulder rotating.  Hold 5 seconds. Release the tension in a controlled manner as you return to the starting position. Repeat 2 times. Complete this exercise every other day.   STRENGTH - Supraspinatus  Stand or sit with good posture. Grasp a 2-3 lb weight or an exercise band/tubing so that your  hand is "thumbs-up," like when you shake hands.  Slowly lift your right / left hand from your thigh into the air, traveling about 30 degrees from straight out at your side. Lift your hand to shoulder height or as far as you can without increasing any shoulder pain. Initially, many people do not lift their hands above shoulder height.  Avoid shrugging  your right / left shoulder as your arm rises by keeping your shoulder blade tucked down and toward your mid-back spine.  Hold for 4-5 seconds. Control the descent of your hand as you slowly return to your starting position. Repeat 2 times. Complete this exercise every other day.   STRENGTH - Shoulder Extensors  Secure a rubber exercise band/tubing so that it is at the height of your shoulders when you are either standing or sitting on a firm arm-less chair.  With a thumbs-up grip, grasp an end of the band/tubing in each hand. Straighten your elbows and lift your hands straight in front of you at shoulder height. Step back away from the secured end of band/tubing until it becomes tense.  Squeezing your shoulder blades together, pull your hands down to the sides of your thighs. Do not allow your hands to go behind you.  Hold for 5 seconds. Slowly ease the tension on the band/tubing as you reverse the directions and return to the starting position. Repeat 2 times. Complete this exercise every other day.   STRENGTH - Scapular Retractors  Secure a rubber exercise band/tubing so that it is at the height of your shoulders when you are either standing or sitting on a firm arm-less chair.  With a palm-down grip, grasp an end of the band/tubing in each hand. Straighten your elbows and lift your hands straight in front of you at shoulder height. Step back away from the secured end of band/tubing until it becomes tense.  Squeezing your shoulder blades together, draw your elbows back as you bend them. Keep your upper arm lifted away from your body  throughout the exercise.  Hold 5 seconds. Slowly ease the tension on the band/tubing as you reverse the directions and return to the starting position. Repeat 2 times. Complete this exercise every other day.  STRENGTH - Scapular Depressors  Find a sturdy chair without wheels, such as a from a dining room table.  Keeping your feet on the floor, lift your bottom from the seat and lock your elbows.  Keeping your elbows straight, allow gravity to pull your body weight down. Your shoulders will rise toward your ears.  Raise your body against gravity by drawing your shoulder blades down your back, shortening the distance between your shoulders and ears. Although your feet should always maintain contact with the floor, your feet should progressively support less body weight as you get stronger.  Hold 5 seconds. In a controlled and slow manner, lower your body weight to begin the next repetition. Repeat 2 times. Complete this exercise every other day.   This information is not intended to replace advice given to you by your health care provider. Make sure you discuss any questions you have with your health care provider.  Document Released: 09/24/2005 Document Revised: 12/01/2014 Document Reviewed: 02/22/2009 Elsevier Interactive Patient Education [employer].

## 2017-09-30 NOTE — Progress Notes (Signed)
Pre visit review using our clinic review tool, if applicable. No additional management support is needed unless otherwise documented below in the visit note. 

## 2017-12-21 DIAGNOSIS — Z411 Encounter for cosmetic surgery: Secondary | ICD-10-CM | POA: Diagnosis not present

## 2017-12-21 DIAGNOSIS — Z23 Encounter for immunization: Secondary | ICD-10-CM | POA: Diagnosis not present

## 2017-12-21 DIAGNOSIS — L821 Other seborrheic keratosis: Secondary | ICD-10-CM | POA: Diagnosis not present

## 2017-12-22 ENCOUNTER — Encounter: Payer: Self-pay | Admitting: Osteopathic Medicine

## 2017-12-22 ENCOUNTER — Ambulatory Visit (INDEPENDENT_AMBULATORY_CARE_PROVIDER_SITE_OTHER): Payer: 59 | Admitting: Osteopathic Medicine

## 2017-12-22 VITALS — BP 147/88 | HR 90 | Temp 98.2°F | Ht 65.0 in | Wt 127.1 lb

## 2017-12-22 DIAGNOSIS — D709 Neutropenia, unspecified: Secondary | ICD-10-CM | POA: Diagnosis not present

## 2017-12-22 DIAGNOSIS — R1031 Right lower quadrant pain: Secondary | ICD-10-CM

## 2017-12-22 NOTE — Patient Instructions (Addendum)
   Will get labs today  Will schedule CT scan  Will try taking the Omeprazole and Famotidine daily for about 6-8 weeks   Will call with results as they come in

## 2017-12-22 NOTE — Progress Notes (Signed)
HPI: Mariah Everett is a 62 y.o. female who  has a past medical history of Chronic benign neutropenia (Mariah Everett), Neck pain, Shoulder pain, and Thyroid nodule.  she presents to [facility] today, 12/22/17,  for chief complaint of: Establish care Pain on R side past 3 mos, comes and goes.   Pain:  . Context: No injury, no previous surgeries in that area . Location: right lower abdomen, into the pelvis . Quality: dull pain, sometimes sharp. . Duration: ongoing altogether for about 3 months . Timing:comes and goes, she cannot connect it to any specific triggers such as bowel movement, time of day, whether or not anything to eat  . Modifying factors: nothing seems to make better or worse . Assoc signs/symptoms: see ROS   Available records reviewed: 08/12/2017 wellness exam with Dr Nani Ravens - no major problems at that time. Back pain. Advised f/u for skin biopsy. Discussed need for DEXA d/t Hc osteopenia.     Past medical, surgical, social and family history reviewed:  Patient Active Problem List   Diagnosis Date Noted  . Somatic complaints, multiple 08/14/2016  . Headache 08/12/2016  . Loss of weight 08/12/2016  . Sciatica of left side 11/10/2014  . Upper airway cough syndrome 11/10/2014  . Chest pain 05/19/2014  . Preventative health care 11/03/2013  . Hyperlipidemia 11/03/2013  . Abdominal pain 12/12/2012  . Rectal bleeding 07/11/2011  . Premature ventricular contraction 07/11/2011  . Other neutropenia ([place]) 07/31/2010  . CERUMEN IMPACTION, BILATERAL 09/03/2009  . THYROID NODULE 05/04/2009  . CERVICAL RADICULOPATHY 12/15/2008    Past Surgical History:  Procedure Laterality Date  . BIOPSY THYROID    . CERVICAL DISCECTOMY  05-18-2009  . CERVICAL FUSION  05-18-2009  . HEMORROIDECTOMY     1990  . LEFT HEART CATHETERIZATION WITH CORONARY ANGIOGRAM N/A 05/19/2014   Procedure: LEFT HEART CATHETERIZATION WITH CORONARY ANGIOGRAM;  Surgeon: Jolaine Artist, MD;  Location: [facility] CATH LAB;  Service: Cardiovascular;  Laterality: N/A;    Social History   Tobacco Use  . Smoking status: Never Smoker  . Smokeless tobacco: Never Used  Substance Use Topics  . Alcohol use: Yes    Family History  Problem Relation Age of Onset  . Ovarian cancer Mother   . Lung cancer Mother   . Cervical cancer Mother   . High blood pressure Mother   . Stroke Mother   . Throat cancer Father   . Lupus Sister   . High blood pressure Sister   . Diabetes Son   . High blood pressure Other      Current medication list and allergy/intolerance information reviewed:    Current Outpatient Medications  Medication Sig Dispense Refill  . Cholecalciferol (VITAMIN D3) 5000 units TABS Take 1 tablet by mouth daily.    . famotidine (PEPCID) 20 MG tablet Take 20 mg by mouth 2 (two) times daily.    . pantoprazole (PROTONIX) 40 MG tablet Take 40 mg by mouth daily.    . TURMERIC PO Take 1,000 mg by mouth daily.    . methylPREDNISolone (MEDROL DOSEPAK) 4 MG TBPK tablet Follow instructions on package. (Patient not taking: Reported on 12/22/2017) 21 tablet 0   No current facility-administered medications for this visit.     No Known Allergies    Review of Systems:  Constitutional:  No  fever, no chills, No recent illness, No unintentional weight changes. No significant fatigue.   HEENT: No  headache, no vision change, no  hearing change, No sore throat, No  sinus pressure  Cardiac: No  chest pain, No  pressure, No palpitations, No  Orthopnea  Respiratory:  No  shortness of breath. No  Cough  Gastrointestinal: +pelvic/abdominal pain, No  nausea, No  vomiting,  No  blood in stool, No  diarrhea, +constipation but only a few days   Musculoskeletal: No new myalgia/arthralgia  Skin: No  Rash  Genitourinary: No  incontinence, No  abnormal genital bleeding, No abnormal genital discharge  Hem/Onc: No  easy bruising/bleeding, No  abnormal lymph node  Endocrine: No  cold intolerance,  No heat intolerance. No polyuria/polydipsia/polyphagia   Neurologic: No  weakness, No  dizziness, No  slurred speech/focal weakness/facial droop  Psychiatric: No  concerns with depression, No  concerns with anxiety, No sleep problems, No mood problems  Exam:  BP (!) 147/88   Pulse 90   Temp 98.2 F (36.8 C) (Oral)   Ht 5\' 5"  (1.651 m)   Wt 127 lb 1.3 oz (57.6 kg)   BMI 21.15 kg/m    Constitutional: VS see above. General Appearance: alert, well-developed, well-nourished, NAD  Eyes: Normal lids and conjunctive, non-icteric sclera  Ears, Nose, Mouth, Throat: MMM, Normal external inspection ears/nares/mouth/lips/gums. .   Neck: No masses, trachea midline. No thyroid enlargement. No tenderness/mass appreciated. No lymphadenopathy  Respiratory: Normal respiratory effort. no wheeze, no rhonchi, no rales  Cardiovascular: S1/S2 normal, no murmur, no rub/gallop auscultated. RRR.  Gastrointestinal: (+)TTP RLQ and LUQ, no masses. No hepatomegaly, no splenomegaly. No hernia appreciated. Bowel sounds normal. Rectal exam deferred. Sore with flexion/extension hip but she states doesn't really reproduce the pain  Musculoskeletal: Gait normal. No clubbing/cyanosis of digits. See GI section above. No hip tenderness, normal ROM hip and lumbar spine, some tenderness R L3 paraspinal region, no rash   Neurological: Normal balance/coordination. No tremor.   Skin: warm, dry, intact. No rash/ulcer. No concerning nevi or subq nodules on limited exam.    Psychiatric: Normal judgment/insight. Normal mood and affect. Oriented x3.      ASSESSMENT/PLAN:   Right lower quadrant abdominal pain - ?msk vs intestinal, difficult to determine from symptoms/exam, eval for dangerous cause w/ CT, pt states UTD on colon cancer screening, await labs  - Plan: CBC with Differential/Platelet, COMPLETE METABOLIC PANEL WITH GFR, Lipase, Urinalysis, Routine w reflex microscopic, Urine Culture, TSH, CT  Abdomen Pelvis W Contrast    Patient Instructions   Will get labs today  Will schedule CT scan  Will try taking the Omeprazole and Famotidine daily for about 6-8 weeks   Will call with results as they come in       Visit summary with medication list and pertinent instructions was printed for patient to review. All questions at time of visit were answered - patient instructed to contact office with any additional concerns. ER/RTC precautions were reviewed with the patient.   Follow-up plan: Return in about 1 week (around 12/29/2017) for recheck right side pain, sooner if needed .    Please note: voice recognition software was used to produce this document, and typos may escape review. Please contact Dr. Sheppard Coil for any needed clarifications.

## 2017-12-23 ENCOUNTER — Other Ambulatory Visit: Payer: Self-pay | Admitting: Osteopathic Medicine

## 2017-12-23 DIAGNOSIS — D709 Neutropenia, unspecified: Secondary | ICD-10-CM

## 2017-12-23 LAB — URINE CULTURE
MICRO NUMBER:: 90125167
SPECIMEN QUALITY:: ADEQUATE

## 2017-12-23 NOTE — Progress Notes (Signed)
Add on lab

## 2017-12-24 MED FILL — FAMOTIDINE 20 MG TABLET: 20 | 30 days supply | Qty: 30 | Fill #2

## 2017-12-25 LAB — COMPLETE METABOLIC PANEL WITH GFR
AG RATIO: 1.8 (calc) (ref 1.0–2.5)
ALBUMIN MSPROF: 4.7 g/dL (ref 3.6–5.1)
ALKALINE PHOSPHATASE (APISO): 39 U/L (ref 33–130)
ALT: 11 U/L (ref 6–29)
AST: 16 U/L (ref 10–35)
BILIRUBIN TOTAL: 0.9 mg/dL (ref 0.2–1.2)
BUN: 8 mg/dL (ref 7–25)
CHLORIDE: 105 mmol/L (ref 98–110)
CO2: 29 mmol/L (ref 20–32)
Calcium: 10 mg/dL (ref 8.6–10.4)
Creat: 0.88 mg/dL (ref 0.50–0.99)
GFR, EST AFRICAN AMERICAN: 81 mL/min/{1.73_m2} (ref >=60)
GFR, Est Non African American: 70 mL/min/{1.73_m2} (ref >=60)
GLUCOSE: 82 mg/dL (ref 65–99)
Globulin: 2.6 g/dL (calc) (ref 1.9–3.7)
POTASSIUM: 4 mmol/L (ref 3.5–5.3)
SODIUM: 142 mmol/L (ref 135–146)
Total Protein: 7.3 g/dL (ref 6.1–8.1)

## 2017-12-25 LAB — CBC WITH DIFFERENTIAL/PLATELET
BASOS PCT: 0.3 %
Basophils Absolute: 11 cells/uL (ref 0–200)
EOS ABS: 50 {cells}/uL (ref 15–500)
Eosinophils Relative: 1.4 %
HCT: 41.7 % (ref 35.0–45.0)
HEMOGLOBIN: 14.2 g/dL (ref 11.7–15.5)
Lymphs Abs: 2336 cells/uL (ref 850–3900)
MCH: 30 pg (ref 27.0–33.0)
MCHC: 34.1 g/dL (ref 32.0–36.0)
MCV: 88.2 fL (ref 80.0–100.0)
MPV: 11.6 fL (ref 7.5–12.5)
Monocytes Relative: 6.7 %
Neutro Abs: 961 cells/uL — ABNORMAL LOW (ref 1500–7800)
Neutrophils Relative %: 26.7 %
Platelets: 208 10*3/uL (ref 140–400)
RBC: 4.73 10*6/uL (ref 3.80–5.10)
RDW: 11.8 % (ref 11.0–15.0)
Total Lymphocyte: 64.9 %
WBC mixed population: 241 cells/uL (ref 200–950)
WBC: 3.6 10*3/uL — ABNORMAL LOW (ref 3.8–10.8)

## 2017-12-25 LAB — URINALYSIS, ROUTINE W REFLEX MICROSCOPIC
BILIRUBIN URINE: NEGATIVE
GLUCOSE, UA: NEGATIVE
HGB URINE DIPSTICK: NEGATIVE
KETONES UR: NEGATIVE
LEUKOCYTES UA: NEGATIVE
Nitrite: NEGATIVE
PH: 7.5 (ref 5.0–8.0)
PROTEIN: NEGATIVE
Specific Gravity, Urine: 1.012 (ref 1.001–1.03)

## 2017-12-25 LAB — TSH: TSH: 1.36 m[IU]/L (ref 0.40–4.50)

## 2017-12-25 LAB — PATHOLOGIST SMEAR REVIEW

## 2017-12-25 LAB — LIPASE: LIPASE: 29 U/L (ref 7–60)

## 2017-12-29 ENCOUNTER — Ambulatory Visit: Payer: 59 | Admitting: Osteopathic Medicine

## 2018-02-15 DIAGNOSIS — H5203 Hypermetropia, bilateral: Secondary | ICD-10-CM | POA: Diagnosis not present

## 2018-02-15 DIAGNOSIS — H524 Presbyopia: Secondary | ICD-10-CM | POA: Diagnosis not present

## 2018-03-02 DIAGNOSIS — H40023 Open angle with borderline findings, high risk, bilateral: Secondary | ICD-10-CM | POA: Diagnosis not present

## 2018-03-17 DIAGNOSIS — K228 Other specified diseases of esophagus: Secondary | ICD-10-CM | POA: Diagnosis not present

## 2018-03-17 DIAGNOSIS — R1013 Epigastric pain: Secondary | ICD-10-CM | POA: Diagnosis not present

## 2018-03-17 DIAGNOSIS — Z1211 Encounter for screening for malignant neoplasm of colon: Secondary | ICD-10-CM | POA: Diagnosis not present

## 2018-03-17 DIAGNOSIS — K649 Unspecified hemorrhoids: Secondary | ICD-10-CM | POA: Diagnosis not present

## 2018-03-17 MED FILL — HYDROCORTISONE 2.5% CREAM: 2.5 | 30 days supply | Qty: 30 | Fill #0

## 2018-03-23 MED FILL — DEXILANT DR 60 MG CAPSULE: 60 | 30 days supply | Qty: 30 | Fill #0

## 2018-03-31 ENCOUNTER — Other Ambulatory Visit: Payer: Self-pay

## 2018-03-31 ENCOUNTER — Emergency Department (HOSPITAL_BASED_OUTPATIENT_CLINIC_OR_DEPARTMENT_OTHER): Payer: 59

## 2018-03-31 ENCOUNTER — Encounter (HOSPITAL_BASED_OUTPATIENT_CLINIC_OR_DEPARTMENT_OTHER): Payer: Self-pay

## 2018-03-31 ENCOUNTER — Emergency Department (HOSPITAL_BASED_OUTPATIENT_CLINIC_OR_DEPARTMENT_OTHER)
Admission: EM | Admit: 2018-03-31 | Discharge: 2018-03-31 | Disposition: A | Discharge status: Home / Self Care | Payer: 59 | Attending: Emergency Medicine | Admitting: Emergency Medicine

## 2018-03-31 DIAGNOSIS — R0789 Other chest pain: Secondary | ICD-10-CM | POA: Insufficient documentation

## 2018-03-31 DIAGNOSIS — R1010 Upper abdominal pain, unspecified: Secondary | ICD-10-CM | POA: Diagnosis not present

## 2018-03-31 DIAGNOSIS — R079 Chest pain, unspecified: Secondary | ICD-10-CM | POA: Diagnosis not present

## 2018-03-31 DIAGNOSIS — Z79899 Other long term (current) drug therapy: Secondary | ICD-10-CM | POA: Insufficient documentation

## 2018-03-31 HISTORY — DX: Dyskinesia of esophagus: K22.4

## 2018-03-31 LAB — TROPONIN I

## 2018-03-31 LAB — CBC WITH DIFFERENTIAL/PLATELET
Basophils Absolute: 0 10*3/uL (ref 0.0–0.1)
Basophils Relative: 0 %
EOS ABS: 0 10*3/uL (ref 0.0–0.7)
EOS PCT: 0 %
HCT: 44.2 % (ref 36.0–46.0)
Hemoglobin: 15.3 g/dL — ABNORMAL HIGH (ref 12.0–15.0)
LYMPHS PCT: 53 %
Lymphs Abs: 1.4 10*3/uL (ref 0.7–4.0)
MCH: 31 pg (ref 26.0–34.0)
MCHC: 34.6 g/dL (ref 30.0–36.0)
MCV: 89.7 fL (ref 78.0–100.0)
MONO ABS: 0.3 10*3/uL (ref 0.1–1.0)
MONOS PCT: 13 %
NEUTROS ABS: 0.9 10*3/uL — AB (ref 1.7–7.7)
Neutrophils Relative %: 34 %
PLATELETS: 191 10*3/uL (ref 150–400)
RBC: 4.93 MIL/uL (ref 3.87–5.11)
RDW: 12.5 % (ref 11.5–15.5)
WBC: 2.6 10*3/uL — ABNORMAL LOW (ref 4.0–10.5)

## 2018-03-31 LAB — COMPREHENSIVE METABOLIC PANEL
ALT: 15 U/L (ref 14–54)
ANION GAP: 9 (ref 5–15)
AST: 22 U/L (ref 15–41)
Albumin: 4.8 g/dL (ref 3.5–5.0)
Alkaline Phosphatase: 40 U/L (ref 38–126)
BUN: 6 mg/dL (ref 6–20)
CHLORIDE: 105 mmol/L (ref 101–111)
CO2: 24 mmol/L (ref 22–32)
Calcium: 9.6 mg/dL (ref 8.9–10.3)
Creatinine, Ser: 0.7 mg/dL (ref 0.44–1.00)
Glucose, Bld: 116 mg/dL — ABNORMAL HIGH (ref 65–99)
POTASSIUM: 3.6 mmol/L (ref 3.5–5.1)
SODIUM: 138 mmol/L (ref 135–145)
Total Bilirubin: 0.8 mg/dL (ref 0.3–1.2)
Total Protein: 8 g/dL (ref 6.5–8.1)

## 2018-03-31 LAB — LIPASE, BLOOD: LIPASE: 39 U/L (ref 11–51)

## 2018-03-31 MED ORDER — GI COCKTAIL ~~LOC~~
30.0000 mL | Freq: Once | ORAL | Status: AC
  Administered 2018-03-31: 30 mL via ORAL
  Filled 2018-03-31: qty 30

## 2018-03-31 MED ORDER — SUCRALFATE 1 G PO TABS: 1.0000 g | ORAL_TABLET | Freq: Three times a day (TID) | ORAL | 0 refills | Status: DC

## 2018-03-31 MED FILL — SUCRALFATE 1 GM TABLET: 1 | 10 days supply | Qty: 40 | Fill #0

## 2018-03-31 NOTE — ED Triage Notes (Signed)
C/o central CP, epigastric pain x today-NAD-steady gait

## 2018-03-31 NOTE — ED Provider Notes (Signed)
[facility] EMERGENCY DEPARTMENT Provider Note   CSN: [id number] Arrival date & time: 03/31/18  1209     History   Chief Complaint Chief Complaint  Patient presents with  . Chest Pain    HPI Mariah Everett is a 62 y.o. female.  HPI Patient presents with central chest discomfort starting at 5 AM today.  States she has been burping a lot over the last few days.  No fever or chills.  No shortness of breath.  Also endorses upper abdominal discomfort.  Had stopped taking her Protonix but took a dose this morning.  Recently diagnosed with esophageal dysmotility by her gastroenterologist. Past Medical History:  Diagnosis Date  . Chronic benign neutropenia (HCC)   . Esophageal dysmotility   . Neck pain    radicular C7/8 right. MRI 12-22-18 pos C7 disc protrusion  . Shoulder pain   . Thyroid nodule    a. Bx/TFTs normal by prior workup, clinically benign.    Patient Active Problem List   Diagnosis Date Noted  . Somatic complaints, multiple 08/14/2016  . Headache 08/12/2016  . Loss of weight 08/12/2016  . Sciatica of left side 11/10/2014  . Upper airway cough syndrome 11/10/2014  . Chest pain 05/19/2014  . Preventative health care 11/03/2013  . Hyperlipidemia 11/03/2013  . Abdominal pain 12/12/2012  . Rectal bleeding 07/11/2011  . Premature ventricular contraction 07/11/2011  . Other neutropenia ([place]) 07/31/2010  . CERUMEN IMPACTION, BILATERAL 09/03/2009  . THYROID NODULE 05/04/2009  . CERVICAL RADICULOPATHY 12/15/2008    Past Surgical History:  Procedure Laterality Date  . BIOPSY THYROID    . CERVICAL DISCECTOMY  05-18-2009  . CERVICAL FUSION  05-18-2009  . HEMORROIDECTOMY     1990  . LEFT HEART CATHETERIZATION WITH CORONARY ANGIOGRAM N/A 05/19/2014   Procedure: LEFT HEART CATHETERIZATION WITH CORONARY ANGIOGRAM;  Surgeon: Jolaine Artist, MD;  Location: [facility] CATH LAB;  Service: Cardiovascular;  Laterality: N/A;     OB History   None      Home  Medications    Prior to Admission medications   Medication Sig Start Date End Date Taking? Authorizing Provider  Cholecalciferol (VITAMIN D3) 5000 units TABS Take 1 tablet by mouth daily.    [provider]  famotidine (PEPCID) 20 MG tablet Take 20 mg by mouth 2 (two) times daily.    [provider]  pantoprazole (PROTONIX) 40 MG tablet Take 40 mg by mouth daily.    [provider]  sucralfate (CARAFATE) 1 g tablet Take 1 tablet (1 g total) by mouth 4 (four) times daily -  with meals and at bedtime. 03/31/18   Julianne Rice, MD  TURMERIC PO Take 1,000 mg by mouth daily.    [provider]    Family History Family History  Problem Relation Age of Onset  . Ovarian cancer Mother   . Lung cancer Mother   . Cervical cancer Mother   . High blood pressure Mother   . Stroke Mother   . Throat cancer Father   . Lupus Sister   . High blood pressure Sister   . Diabetes Son   . High blood pressure Other     Social History Social History   Tobacco Use  . Smoking status: Never Smoker  . Smokeless tobacco: Never Used  Substance Use Topics  . Alcohol use: Yes    Comment: occ  . Drug use: No     Allergies   Patient has no known allergies.  Review of Systems Review of Systems  Constitutional: Negative for chills and fever.  HENT: Negative for sore throat.   Eyes: Negative for photophobia and visual disturbance.  Respiratory: Positive for cough. Negative for shortness of breath and wheezing.   Cardiovascular: Positive for chest pain. Negative for palpitations and leg swelling.  Gastrointestinal: Positive for abdominal pain. Negative for constipation, diarrhea, nausea and vomiting.  Musculoskeletal: Negative for back pain, myalgias and neck pain.  Skin: Negative for rash and wound.  Neurological: Negative for dizziness, weakness, light-headedness, numbness and headaches.  All other systems reviewed and are negative.    Physical Exam Updated  Vital Signs BP (!) 124/92   Pulse 83   Temp 99.2 F (37.3 C) (Oral)   Resp 15   Ht 5\' 5"  (1.651 m)   Wt 58.1 kg (128 lb)   SpO2 98%   BMI 21.30 kg/m   Physical Exam  Constitutional: She is oriented to person, place, and time. She appears well-developed and well-nourished. No distress.  HENT:  Head: Normocephalic and atraumatic.  Mouth/Throat: Oropharynx is clear and moist. No oropharyngeal exudate.  Eyes: Pupils are equal, round, and reactive to light. EOM are normal.  Neck: Normal range of motion. Neck supple. No JVD present.  Cardiovascular: Normal rate and regular rhythm. Exam reveals no gallop and no friction rub.  No murmur heard. Pulmonary/Chest: Effort normal and breath sounds normal. No stridor. No respiratory distress. She has no wheezes. She has no rales. She exhibits no tenderness.  Abdominal: Soft. Bowel sounds are normal. There is no tenderness. There is no rebound and no guarding.  Musculoskeletal: Normal range of motion. She exhibits no edema or tenderness.  No lower extremity swelling, asymmetry or tenderness.  Lymphadenopathy:    She has no cervical adenopathy.  Neurological: She is alert and oriented to person, place, and time.  Moves all extremities without focal deficit.  Sensation intact.  Skin: Skin is warm and dry. No rash noted. She is not diaphoretic. No erythema.  Psychiatric: She has a normal mood and affect. Her behavior is normal.  Nursing note and vitals reviewed.    ED Treatments / Results  Labs (all labs ordered are listed, but only abnormal results are displayed) Labs Reviewed  CBC WITH DIFFERENTIAL/PLATELET - Abnormal; Notable for the following components:      Result Value   WBC 2.6 (*)    Hemoglobin 15.3 (*)    Neutro Abs 0.9 (*)    All other components within normal limits  COMPREHENSIVE METABOLIC PANEL - Abnormal; Notable for the following components:   Glucose, Bld 116 (*)    All other components within normal limits  TROPONIN I    LIPASE, BLOOD    EKG EKG Interpretation  Date/Time:  Wednesday Mar 31 2018 12:15:53 EDT Ventricular Rate:  93 PR Interval:  138 QRS Duration: 74 QT Interval:  362 QTC Calculation: 450 R Axis:   77 Text Interpretation:  Normal sinus rhythm Cannot rule out Anterior infarct , age undetermined Abnormal ECG Confirmed by Julianne Rice [phone]) on 03/31/2018 1:41:10 PM   Radiology Dg Chest 2 View  Result Date: 03/31/2018 CLINICAL DATA:  Acute chest pain today. EXAM: CHEST - 2 VIEW COMPARISON:  05/01/2017 and prior radiographs FINDINGS: The cardiomediastinal silhouette is unremarkable. There is no evidence of focal airspace disease, pulmonary edema, suspicious pulmonary nodule/mass, pleural effusion, or pneumothorax. No acute bony abnormalities are identified. Cervical spine fusion hardware again noted. IMPRESSION: No active cardiopulmonary disease. Electronically Signed   By:  Margarette Canada M.D.   On: 03/31/2018 13:07    Procedures Procedures (including critical care time)  Medications Ordered in ED Medications  gi cocktail (Maalox,Lidocaine,Donnatal) (30 mLs Oral Given 03/31/18 1358)     Initial Impression / Assessment and Plan / ED Course  I have reviewed the triage vital signs and the nursing notes.  Pertinent labs & imaging results that were available during my care of the patient were reviewed by me and considered in my medical decision making (see chart for details).    Patient's chest discomfort and upper abdominal pain have significantly improved.  Troponin is normal.  Chest x-ray without obvious abnormality.  Patient states she has not been taking her Protonix regularly.  Suspect symptoms are related to gastritis/esophageal reflux.  Advised starting back on Protonix and Pepcid and will add Carafate to regimen.  Advised to follow-up closely with her primary physician and gastroenterologist.  Return precautions have been given.  Final Clinical Impressions(s) / ED Diagnoses    Final diagnoses:  Atypical chest pain    ED Discharge Orders        Ordered    sucralfate (CARAFATE) 1 g tablet  3 times daily with meals & bedtime     03/31/18 1541       Julianne Rice, MD 03/31/18 1551

## 2018-04-08 ENCOUNTER — Other Ambulatory Visit: Payer: Self-pay | Admitting: Internal Medicine

## 2018-04-08 DIAGNOSIS — R058 Other specified cough: Secondary | ICD-10-CM

## 2018-04-08 DIAGNOSIS — R05 Cough: Secondary | ICD-10-CM

## 2018-05-10 DIAGNOSIS — K219 Gastro-esophageal reflux disease without esophagitis: Secondary | ICD-10-CM | POA: Diagnosis not present

## 2018-05-23 IMAGING — DX DG CHEST 2V
2 series · 2 of 2 positions shown · non-contrast
Comparison: 08/12/2016

CLINICAL DATA: Persistent cough, congestion

EXAM:
CHEST  2 VIEW

[chest pa]
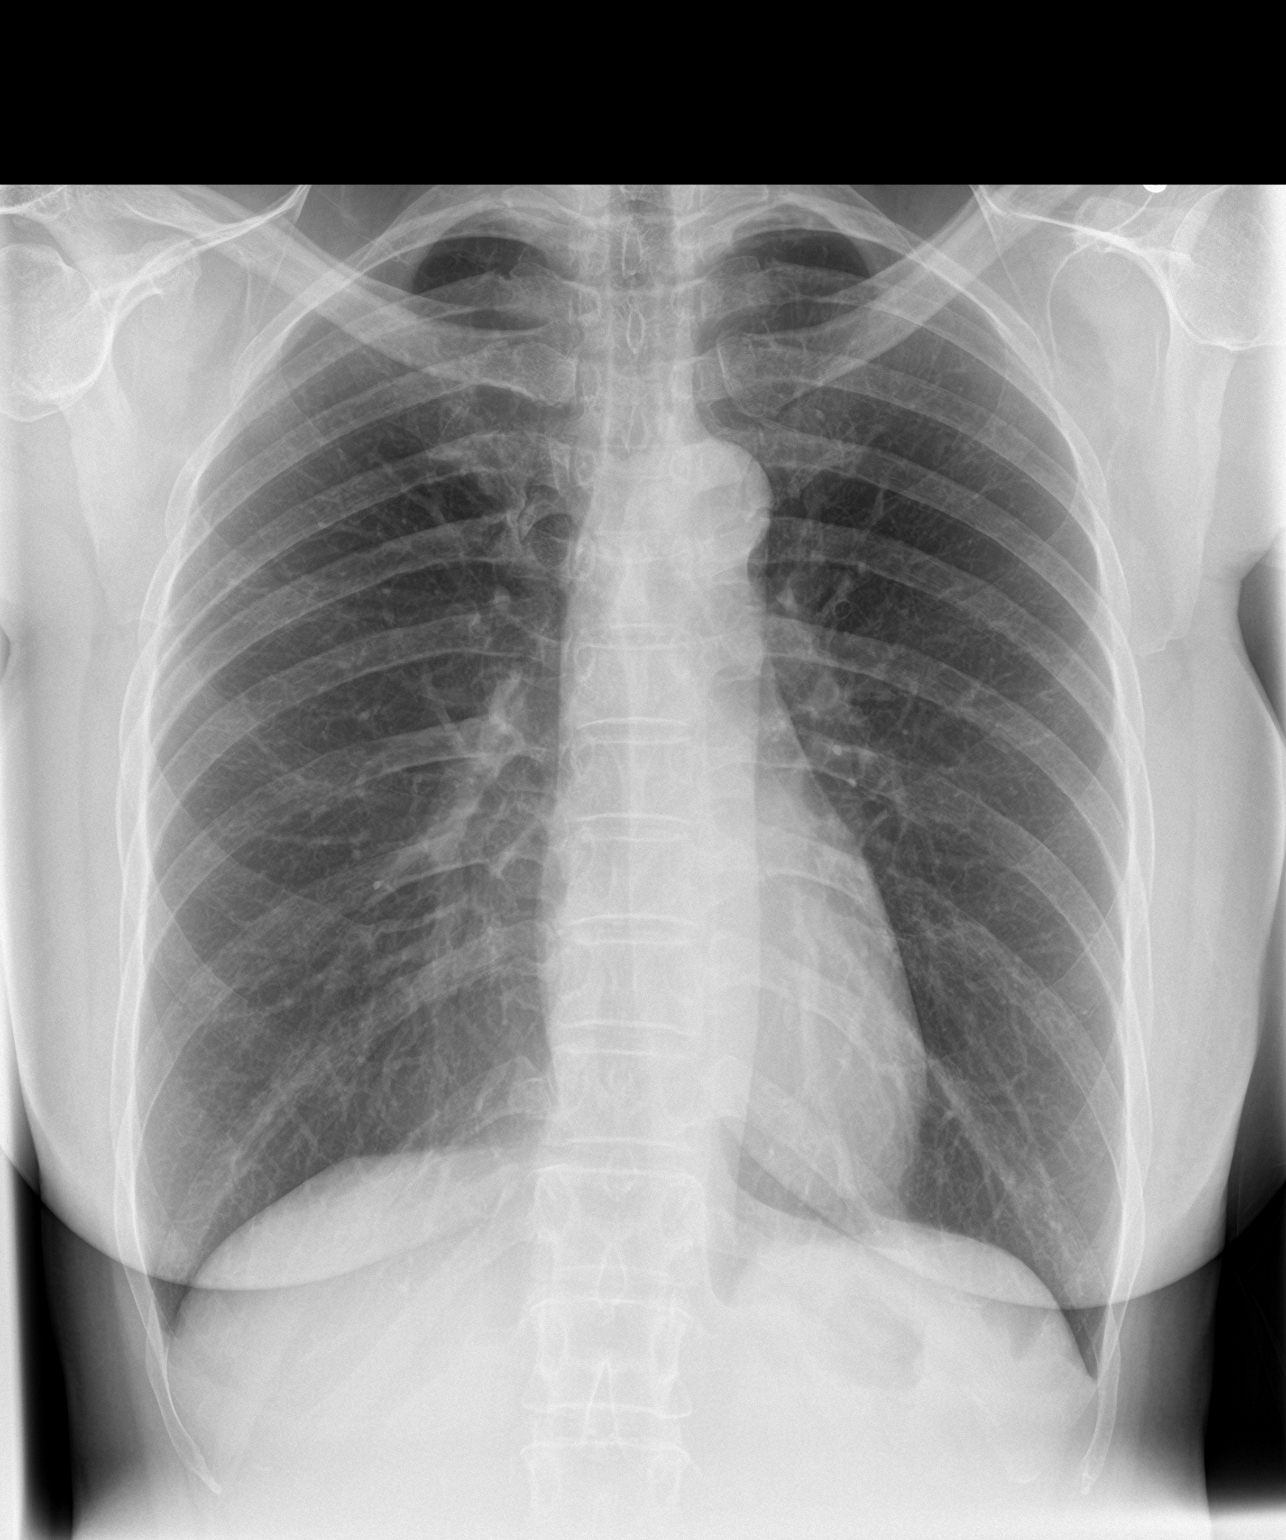

[chest lat]
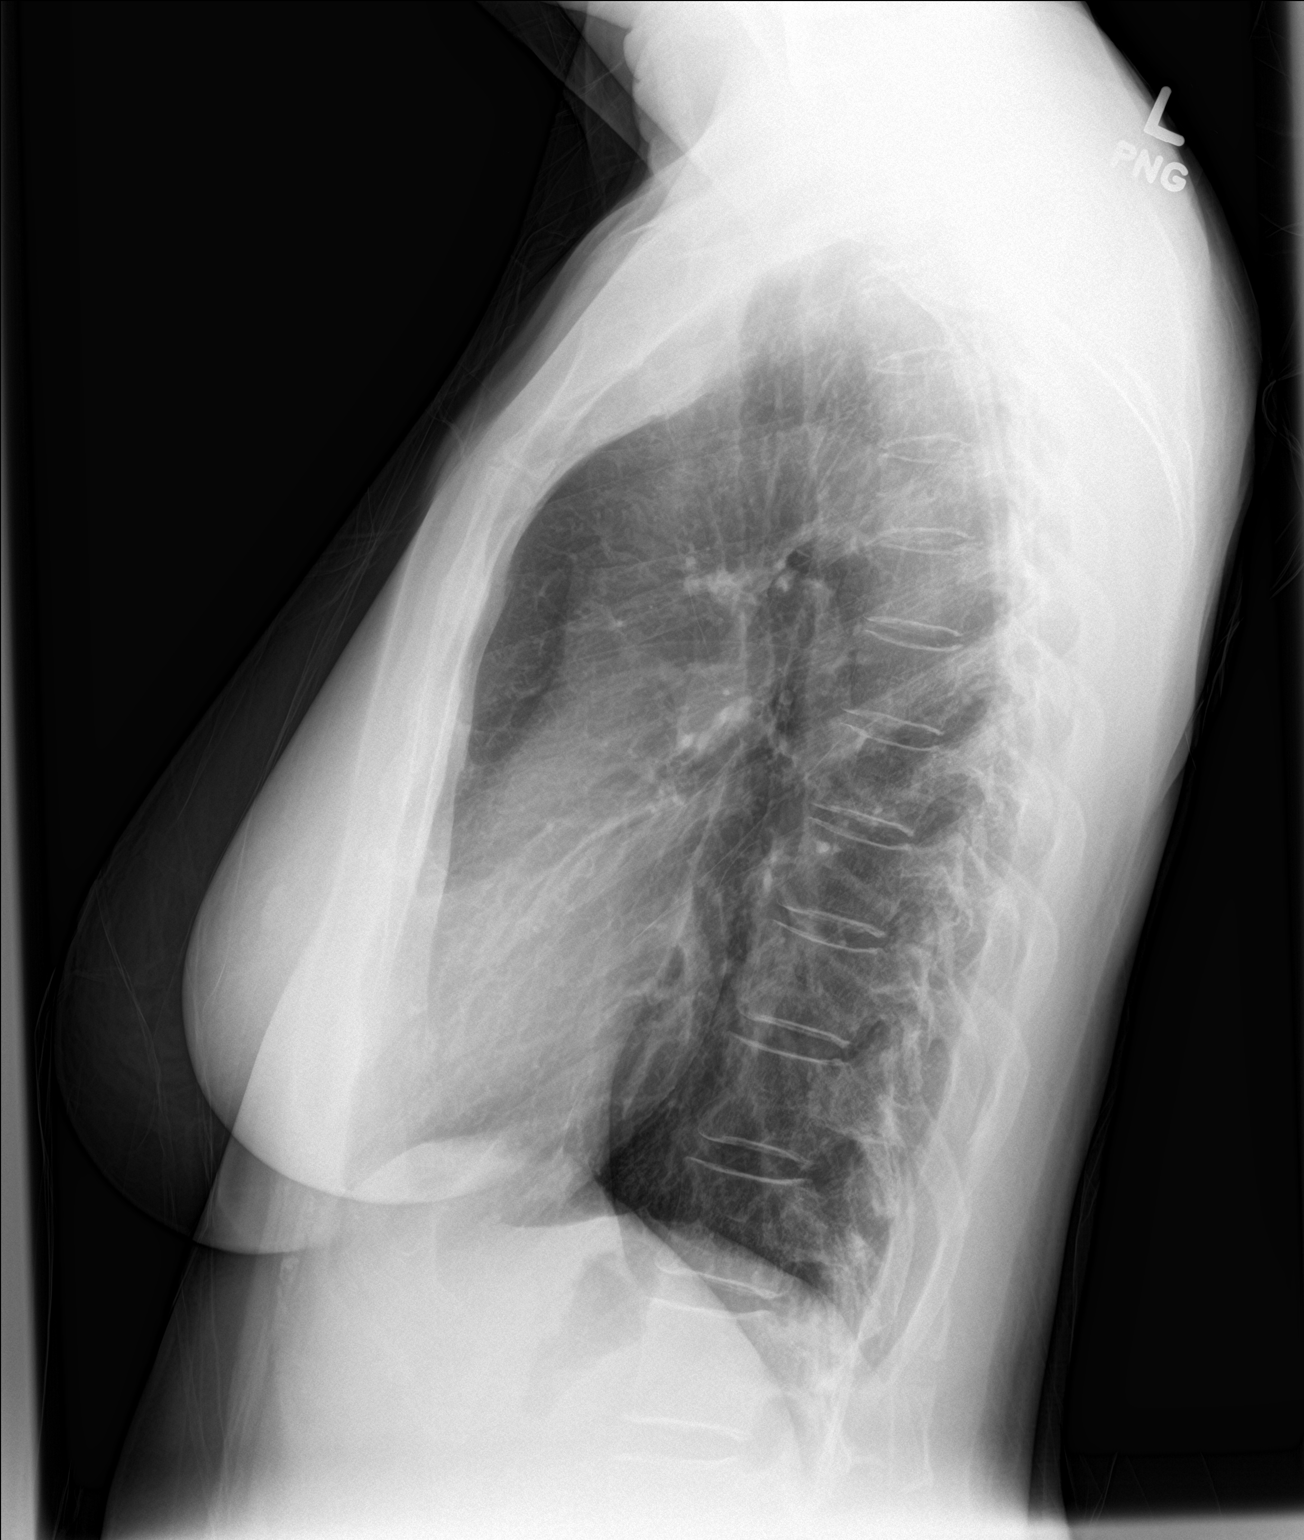

[2 of 2 positions shown; findings below may reference images not displayed]

FINDINGS: Mild hyperinflation, stable. Heart and mediastinal contours are
within normal limits. No focal opacities or effusions. No acute bony
abnormality.
IMPRESSION: No active cardiopulmonary disease.

## 2018-07-12 DIAGNOSIS — R490 Dysphonia: Secondary | ICD-10-CM | POA: Diagnosis not present

## 2018-07-12 DIAGNOSIS — K219 Gastro-esophageal reflux disease without esophagitis: Secondary | ICD-10-CM | POA: Diagnosis not present

## 2018-07-12 DIAGNOSIS — F458 Other somatoform disorders: Secondary | ICD-10-CM | POA: Diagnosis not present

## 2018-07-12 DIAGNOSIS — R0989 Other specified symptoms and signs involving the circulatory and respiratory systems: Secondary | ICD-10-CM | POA: Insufficient documentation

## 2018-07-12 MED FILL — OMEPRAZOLE 40 MG CPDR: 40 | 30 days supply | Qty: 30 | Fill #0

## 2018-08-20 MED FILL — OMEPRAZOLE 40 MG CPDR: 40 | 30 days supply | Qty: 30 | Fill #1

## 2018-08-31 DIAGNOSIS — R079 Chest pain, unspecified: Secondary | ICD-10-CM | POA: Diagnosis not present

## 2018-08-31 DIAGNOSIS — K219 Gastro-esophageal reflux disease without esophagitis: Secondary | ICD-10-CM | POA: Diagnosis not present

## 2018-09-14 DIAGNOSIS — H40023 Open angle with borderline findings, high risk, bilateral: Secondary | ICD-10-CM | POA: Diagnosis not present

## 2018-09-27 DIAGNOSIS — Z01419 Encounter for gynecological examination (general) (routine) without abnormal findings: Secondary | ICD-10-CM | POA: Diagnosis not present

## 2018-09-27 DIAGNOSIS — Z6820 Body mass index (BMI) 20.0-20.9, adult: Secondary | ICD-10-CM | POA: Diagnosis not present

## 2018-09-27 DIAGNOSIS — Z1231 Encounter for screening mammogram for malignant neoplasm of breast: Secondary | ICD-10-CM | POA: Diagnosis not present

## 2018-09-28 ENCOUNTER — Encounter: Payer: Self-pay | Admitting: Osteopathic Medicine

## 2018-09-28 ENCOUNTER — Telehealth: Payer: Self-pay | Admitting: Osteopathic Medicine

## 2018-09-28 ENCOUNTER — Ambulatory Visit (INDEPENDENT_AMBULATORY_CARE_PROVIDER_SITE_OTHER): Payer: 59 | Admitting: Osteopathic Medicine

## 2018-09-28 VITALS — BP 126/86 | HR 82 | Temp 98.3°F | Wt 123.2 lb

## 2018-09-28 DIAGNOSIS — E041 Nontoxic single thyroid nodule: Secondary | ICD-10-CM

## 2018-09-28 DIAGNOSIS — M8580 Other specified disorders of bone density and structure, unspecified site: Secondary | ICD-10-CM | POA: Diagnosis not present

## 2018-09-28 DIAGNOSIS — Z0000 Encounter for general adult medical examination without abnormal findings: Secondary | ICD-10-CM

## 2018-09-28 DIAGNOSIS — D708 Other neutropenia: Secondary | ICD-10-CM | POA: Diagnosis not present

## 2018-09-28 DIAGNOSIS — E785 Hyperlipidemia, unspecified: Secondary | ICD-10-CM | POA: Diagnosis not present

## 2018-09-28 NOTE — Telephone Encounter (Signed)
Added

## 2018-09-28 NOTE — Progress Notes (Signed)
HPI: Mariah Everett is a 62 y.o. female who  has a past medical history of Chronic benign neutropenia ([place]), Esophageal dysmotility, Neck pain, Shoulder pain, and Thyroid nodule.  she presents to [facility] today, 09/28/18,  for chief complaint of: Annual Physical    Patient here for annual physical / wellness exam.  See preventive care reviewed as below.   Additional concerns today include:   None, she is following with GI specialists for chronic abdominal/chest fullness feeling, has a cardiac stress test upcoming as well.  Angina or claudication  Following with OB/GYN for mammograms and Pap, no recent records     Past medical, surgical, social and family history reviewed:  Patient Active Problem List   Diagnosis Date Noted  . Somatic complaints, multiple 08/14/2016  . Headache 08/12/2016  . Loss of weight 08/12/2016  . Sciatica of left side 11/10/2014  . Upper airway cough syndrome 11/10/2014  . Chest pain 05/19/2014  . Preventative health care 11/03/2013  . Hyperlipidemia 11/03/2013  . Abdominal pain 12/12/2012  . Rectal bleeding 07/11/2011  . Premature ventricular contraction 07/11/2011  . Other neutropenia ([place]) 07/31/2010  . CERUMEN IMPACTION, BILATERAL 09/03/2009  . THYROID NODULE 05/04/2009  . CERVICAL RADICULOPATHY 12/15/2008    Past Surgical History:  Procedure Laterality Date  . BIOPSY THYROID    . CERVICAL DISCECTOMY  05-18-2009  . CERVICAL FUSION  05-18-2009  . HEMORROIDECTOMY     1990  . LEFT HEART CATHETERIZATION WITH CORONARY ANGIOGRAM N/A 05/19/2014   Procedure: LEFT HEART CATHETERIZATION WITH CORONARY ANGIOGRAM;  Surgeon: Jolaine Artist, MD;  Location: [facility] CATH LAB;  Service: Cardiovascular;  Laterality: N/A;    Social History   Tobacco Use  . Smoking status: Never Smoker  . Smokeless tobacco: Never Used  Substance Use Topics  . Alcohol use: Yes    Comment: occ    Family History  Problem Relation  Age of Onset  . Ovarian cancer Mother   . Lung cancer Mother   . Cervical cancer Mother   . High blood pressure Mother   . Stroke Mother   . Throat cancer Father   . Lupus Sister   . High blood pressure Sister   . Diabetes Son   . High blood pressure Other      Current medication list and allergy/intolerance information reviewed:    Current Outpatient Medications  Medication Sig Dispense Refill  . Cholecalciferol (VITAMIN D3) 5000 units TABS Take 1 tablet by mouth daily.    . famotidine (PEPCID) 20 MG tablet Take 20 mg by mouth 2 (two) times daily.    . Multiple Vitamin (MULTIVITAMIN) tablet Take 1 tablet by mouth daily.    Marland Kitchen omeprazole (PRILOSEC) 20 MG capsule Take by mouth.    . TURMERIC PO Take 1,000 mg by mouth daily.    . pantoprazole (PROTONIX) 40 MG tablet Take 40 mg by mouth daily.    . sucralfate (CARAFATE) 1 g tablet Take 1 tablet (1 g total) by mouth 4 (four) times daily -  with meals and at bedtime. (Patient not taking: Reported on 09/28/2018) 40 tablet 0   No current facility-administered medications for this visit.     No Known Allergies    Review of Systems:  Constitutional:  No  fever, no chills, No recent illness, No unintentional weight changes. No significant fatigue.   HEENT: No  headache, no vision change, no hearing change, No sore throat, No  sinus pressure  Cardiac: No  chest pain, +pressure, No palpitations, No  Orthopnea  Respiratory:  No  shortness of breath. No  Cough  Gastrointestinal: +abdominal pain, No  nausea, No  vomiting,  No  blood in stool, No  diarrhea, No  constipation   Musculoskeletal: No new myalgia/arthralgia  Skin: No  Rash, No other wounds/concerning lesions  Genitourinary: No  incontinence, No  abnormal genital bleeding, No abnormal genital discharge  Hem/Onc: No  easy bruising/bleeding, No  abnormal lymph node  Endocrine: No cold intolerance,  No heat intolerance. No polyuria/polydipsia/polyphagia   Neurologic: No   weakness, No  dizziness, No  slurred speech/focal weakness/facial droop  Psychiatric: No  concerns with depression, No  concerns with anxiety, No sleep problems, No mood problems  Exam:  BP 126/86 (BP Location: Left Arm, Patient Position: Sitting, Cuff Size: Normal)   Pulse 82   Temp 98.3 F (36.8 C) (Oral)   Wt 123 lb 3.2 oz (55.9 kg)   BMI 20.50 kg/m   Constitutional: VS see above. General Appearance: alert, well-developed, well-nourished, NAD  Eyes: Normal lids and conjunctive, non-icteric sclera  Ears, Nose, Mouth, Throat: MMM, Normal external inspection ears/nares/mouth/lips/gums. TM normal bilaterally. Pharynx/tonsils no erythema, no exudate. Nasal mucosa normal.   Neck: No masses, trachea midline. No thyroid enlargement. No tenderness/mass appreciated. No lymphadenopathy  Respiratory: Normal respiratory effort. no wheeze, no rhonchi, no rales  Cardiovascular: S1/S2 normal, no murmur, no rub/gallop auscultated. RRR. No lower extremity edema.   Gastrointestinal: Nontender, no masses. No hepatomegaly, no splenomegaly. No hernia appreciated. Bowel sounds normal. Rectal exam deferred.   Musculoskeletal: Gait normal. No clubbing/cyanosis of digits.   Neurological: Normal balance/coordination. No tremor. No cranial nerve deficit on limited exam. Motor and sensation intact and symmetric. Cerebellar reflexes intact.   Skin: warm, dry, intact. No rash/ulcer. No concerning nevi or subq nodules on limited exam.    Psychiatric: Normal judgment/insight. Normal mood and affect. Oriented x3.      ASSESSMENT/PLAN:   Annual physical exam - Plan: COMPLETE METABOLIC PANEL WITH GFR, Lipid panel, CBC with Differential/Platelet, TSH  THYROID NODULE - Plan: COMPLETE METABOLIC PANEL WITH GFR, Lipid panel, CBC with Differential/Platelet, TSH  Hyperlipidemia, unspecified hyperlipidemia type - Plan: COMPLETE METABOLIC PANEL WITH GFR, Lipid panel, CBC with Differential/Platelet, TSH  Other  neutropenia (HCC) - Plan: COMPLETE METABOLIC PANEL WITH GFR, Lipid panel, CBC with Differential/Platelet, TSH  Osteopenia, unspecified location - Plan: DG Bone Density   Immunization History  Administered Date(s) Administered  . Influenza Whole 07/25/2012  . Influenza,inj,Quad PF,6+ Mos 09/22/2013, 11/07/2014  . Influenza-Unspecified 11/13/2015, 08/22/2016, 09/24/2017, 07/25/2018  . Td 12/15/2008    Patient Instructions  General Preventive Care  Most recent routine screening lipids/other labs: ordered today. Cholesterol and Diabetes screening usually recommended annually.   Everyone should have blood pressure checked once per year.   Tobacco: don't! Alcohol: responsible moderation is ok for most adults - if you have concerns about your alcohol intake, please talk to me! Recreational/Illicit Drugs: don't!  Exercise: as tolerated to reduce risk of cardiovascular disease and diabetes. Strength training will also prevent osteoporosis.   Mental health: if need for mental health care (medicines, counseling, other), or concerns about moods, please let me know!   Sexual health: if need for STD testing, or if concerns with libido/pain problems, please let me know!  Vaccines  Flu vaccine: recommended for almost everyone, every fall (by Halloween! Flu is scary!) - thanks for getting your this year!   Shingles vaccine: Shingrix recommended after age 84 - will  call you when this vaccine is in stock   Pneumonia vaccines: Prevnar and Pneumovax recommended after age 14, sooner if diabetes, COPD/asthma, others  Tetanus booster: Tdap recommended every 10 years - done 12/15/2008 Cancer screenings   Colon cancer screening: recommended follow-up as directed with your GI specialist  Breast cancer screening: mammogram recommended annually after age 57.   Cervical cancer screening: Pap every 1 to 5 years depending on age and other risk factors. Can stop at age 36 or w/ hysterectomy as long as  previous testing was normal. You'll be due next year.   Lung cancer screening: not needed for non-smokers Infection screenings . HIV: recommended screening at least once age 89-65, yours was already done, repeat as needed  . Gonorrhea/Chlamydia: screening as needed . Hepatitis C: recommended for anyone born 68-1967,  yours was already done, repeat as needed . TB: certain at-risk populations, or depending on work requirements and/or travel history Other . Bone Density Test: recommended for women at age 31, sooner depending on risk factors . Advanced Directive: Living Will and/or Healthcare Power of Attorney recommended for all adults, regardless of age or health!          Visit summary with medication list and pertinent instructions was printed for patient to review. All questions at time of visit were answered - patient instructed to contact office with any additional concerns. ER/RTC precautions were reviewed with the patient.   Follow-up plan: Return in about 1 year (around 09/29/2019) for annual physical, sooner if needed. .     Please note: voice recognition software was used to produce this document, and typos may escape review. Please contact Dr. Sheppard Coil for any needed clarifications.

## 2018-09-28 NOTE — Patient Instructions (Addendum)
General Preventive Care  Most recent routine screening lipids/other labs: ordered today. Cholesterol and Diabetes screening usually recommended annually.   Everyone should have blood pressure checked once per year.   Tobacco: don't! Alcohol: responsible moderation is ok for most adults - if you have concerns about your alcohol intake, please talk to me! Recreational/Illicit Drugs: don't!  Exercise: as tolerated to reduce risk of cardiovascular disease and diabetes. Strength training will also prevent osteoporosis.   Mental health: if need for mental health care (medicines, counseling, other), or concerns about moods, please let me know!   Sexual health: if need for STD testing, or if concerns with libido/pain problems, please let me know!  Vaccines  Flu vaccine: recommended for almost everyone, every fall (by Halloween! Flu is scary!) - thanks for getting your this year!   Shingles vaccine: Shingrix recommended after age 18 - will call you when this vaccine is in stock   Pneumonia vaccines: Prevnar and Pneumovax recommended after age 52, sooner if diabetes, COPD/asthma, others  Tetanus booster: Tdap recommended every 10 years - done 12/15/2008 Cancer screenings   Colon cancer screening: recommended follow-up as directed with your GI specialist  Breast cancer screening: mammogram recommended annually after age 53.   Cervical cancer screening: Pap every 1 to 5 years depending on age and other risk factors. Can stop at age 104 or w/ hysterectomy as long as previous testing was normal. You'll be due next year.   Lung cancer screening: not needed for non-smokers Infection screenings . HIV: recommended screening at least once age 76-65, yours was already done, repeat as needed  . Gonorrhea/Chlamydia: screening as needed . Hepatitis C: recommended for anyone born 50-1965,  yours was already done, repeat as needed . TB: certain at-risk populations, or depending on work requirements and/or  travel history Other . Bone Density Test: recommended for women at age 23, sooner depending on risk factors . Advanced Directive: Living Will and/or Healthcare Power of Attorney recommended for all adults, regardless of age or health!

## 2018-09-28 NOTE — Telephone Encounter (Signed)
-----   Message from Emeterio Reeve, DO sent at 09/28/2018  8:33 AM EST ----- Regarding: shingrix shingrix list please and thanks!

## 2018-09-29 DIAGNOSIS — L918 Other hypertrophic disorders of the skin: Secondary | ICD-10-CM | POA: Diagnosis not present

## 2018-09-29 DIAGNOSIS — L821 Other seborrheic keratosis: Secondary | ICD-10-CM | POA: Diagnosis not present

## 2018-09-29 LAB — CBC WITH DIFFERENTIAL/PLATELET
BASOS PCT: 0.3 %
Basophils Absolute: 10 cells/uL (ref 0–200)
EOS PCT: 0.9 %
Eosinophils Absolute: 31 cells/uL (ref 15–500)
HCT: 44.9 % (ref 35.0–45.0)
Hemoglobin: 15.1 g/dL (ref 11.7–15.5)
LYMPHS ABS: 1714 {cells}/uL (ref 850–3900)
MCH: 29.9 pg (ref 27.0–33.0)
MCHC: 33.6 g/dL (ref 32.0–36.0)
MCV: 88.9 fL (ref 80.0–100.0)
MPV: 11.7 fL (ref 7.5–12.5)
Monocytes Relative: 6.8 %
Neutro Abs: 1414 cells/uL — ABNORMAL LOW (ref 1500–7800)
Neutrophils Relative %: 41.6 %
PLATELETS: 212 10*3/uL (ref 140–400)
RBC: 5.05 10*6/uL (ref 3.80–5.10)
RDW: 12.3 % (ref 11.0–15.0)
TOTAL LYMPHOCYTE: 50.4 %
WBC: 3.4 10*3/uL — AB (ref 3.8–10.8)
WBCMIX: 231 {cells}/uL (ref 200–950)

## 2018-09-29 LAB — COMPLETE METABOLIC PANEL WITH GFR
AG RATIO: 1.8 (calc) (ref 1.0–2.5)
ALBUMIN MSPROF: 4.6 g/dL (ref 3.6–5.1)
ALT: 17 U/L (ref 6–29)
AST: 18 U/L (ref 10–35)
Alkaline phosphatase (APISO): 39 U/L (ref 33–130)
BILIRUBIN TOTAL: 1 mg/dL (ref 0.2–1.2)
BUN: 7 mg/dL (ref 7–25)
CHLORIDE: 104 mmol/L (ref 98–110)
CO2: 30 mmol/L (ref 20–32)
Calcium: 10 mg/dL (ref 8.6–10.4)
Creat: 0.66 mg/dL (ref 0.50–0.99)
GFR, EST AFRICAN AMERICAN: 108 mL/min/{1.73_m2} (ref >=60)
GFR, EST NON AFRICAN AMERICAN: 93 mL/min/{1.73_m2} (ref >=60)
Globulin: 2.6 g/dL (calc) (ref 1.9–3.7)
Glucose, Bld: 97 mg/dL (ref 65–99)
POTASSIUM: 4.6 mmol/L (ref 3.5–5.3)
Sodium: 141 mmol/L (ref 135–146)
TOTAL PROTEIN: 7.2 g/dL (ref 6.1–8.1)

## 2018-09-29 LAB — LIPID PANEL
Cholesterol: 194 mg/dL (ref <200)
HDL: 81 mg/dL (ref >50)
LDL Cholesterol (Calc): 96 mg/dL (calc)
NON-HDL CHOLESTEROL (CALC): 113 mg/dL (ref <130)
Total CHOL/HDL Ratio: 2.4 (calc) (ref <5.0)
Triglycerides: 80 mg/dL (ref <150)

## 2018-09-29 LAB — TSH: TSH: 1.23 m[IU]/L (ref 0.40–4.50)

## 2018-09-30 ENCOUNTER — Telehealth: Payer: Self-pay | Admitting: *Deleted

## 2018-09-30 NOTE — Telephone Encounter (Signed)
Received Mammogram results from [place]; forwarded to provider/SLS 11/07

## 2018-10-18 ENCOUNTER — Telehealth: Payer: Self-pay | Admitting: Osteopathic Medicine

## 2018-10-18 ENCOUNTER — Ambulatory Visit: Payer: 59

## 2018-10-18 NOTE — Telephone Encounter (Signed)
Pt came into clinic today for Shingrix vaccine. She was hesitant on getting vaccine, so educational information was provided and she was advised to contact clinic when she was ready. If she still had questions, advised to schedule with PCP.   NV cancelled.

## 2018-11-03 ENCOUNTER — Ambulatory Visit (INDEPENDENT_AMBULATORY_CARE_PROVIDER_SITE_OTHER): Payer: 59 | Admitting: Osteopathic Medicine

## 2018-11-03 VITALS — BP 125/89 | HR 79 | Temp 97.8°F | Wt 126.0 lb

## 2018-11-03 DIAGNOSIS — R0789 Other chest pain: Secondary | ICD-10-CM | POA: Diagnosis not present

## 2018-11-03 DIAGNOSIS — K219 Gastro-esophageal reflux disease without esophagitis: Secondary | ICD-10-CM | POA: Diagnosis not present

## 2018-11-03 DIAGNOSIS — Z23 Encounter for immunization: Secondary | ICD-10-CM | POA: Diagnosis not present

## 2018-11-03 NOTE — Progress Notes (Signed)
Pt in today for shingrix vaccine. This is1 of 2 of the shingrix series. Vitals taken and no fever noted. Vaccine was given in LEFT deltoid. Pt tolerated well with no immediate complications.

## 2018-11-10 ENCOUNTER — Encounter: Payer: Self-pay | Admitting: Osteopathic Medicine

## 2018-11-10 DIAGNOSIS — I10 Essential (primary) hypertension: Secondary | ICD-10-CM | POA: Diagnosis not present

## 2018-11-10 DIAGNOSIS — I358 Other nonrheumatic aortic valve disorders: Secondary | ICD-10-CM | POA: Diagnosis not present

## 2018-11-10 DIAGNOSIS — I493 Ventricular premature depolarization: Secondary | ICD-10-CM | POA: Diagnosis not present

## 2018-11-10 DIAGNOSIS — I340 Nonrheumatic mitral (valve) insufficiency: Secondary | ICD-10-CM | POA: Diagnosis not present

## 2018-11-25 ENCOUNTER — Telehealth: Payer: Self-pay | Admitting: Osteopathic Medicine

## 2018-11-25 NOTE — Telephone Encounter (Signed)
Stress echo at [facility] 11/10/18 Normal stress echo See scanned documents

## 2019-01-04 DIAGNOSIS — M79604 Pain in right leg: Secondary | ICD-10-CM | POA: Diagnosis not present

## 2019-01-04 DIAGNOSIS — M5417 Radiculopathy, lumbosacral region: Secondary | ICD-10-CM | POA: Insufficient documentation

## 2019-01-04 MED FILL — GABAPENTIN 100 MG CAPSULE: 100 | 60 days supply | Qty: 60 | Fill #0

## 2019-01-17 DIAGNOSIS — M5416 Radiculopathy, lumbar region: Secondary | ICD-10-CM | POA: Diagnosis not present

## 2019-01-24 DIAGNOSIS — M79604 Pain in right leg: Secondary | ICD-10-CM | POA: Diagnosis not present

## 2019-01-25 ENCOUNTER — Ambulatory Visit (INDEPENDENT_AMBULATORY_CARE_PROVIDER_SITE_OTHER): Payer: 59 | Admitting: Osteopathic Medicine

## 2019-01-25 VITALS — Temp 98.3°F

## 2019-01-25 DIAGNOSIS — Z23 Encounter for immunization: Secondary | ICD-10-CM

## 2019-01-25 NOTE — Progress Notes (Signed)
Established Patient Office Visit  Subjective:  Patient ID: Mariah Everett, female    DOB: 1956/05/31  Age: 63 y.o. MRN: [id number]  CC:  Chief Complaint  Patient presents with  . Immunizations    HPI Mariah Everett presents for last Shingrix vaccine and a T-dap. Denies any problems with vaccines in the past.   Past Medical History:  Diagnosis Date  . Chronic benign neutropenia (HCC)   . Esophageal dysmotility   . Neck pain    radicular C7/8 right. MRI 12-22-18 pos C7 disc protrusion  . Shoulder pain   . Thyroid nodule    a. Bx/TFTs normal by prior workup, clinically benign.    Past Surgical History:  Procedure Laterality Date  . BIOPSY THYROID    . CERVICAL DISCECTOMY  05-18-2009  . CERVICAL FUSION  05-18-2009  . HEMORROIDECTOMY     1990  . LEFT HEART CATHETERIZATION WITH CORONARY ANGIOGRAM N/A 05/19/2014   Procedure: LEFT HEART CATHETERIZATION WITH CORONARY ANGIOGRAM;  Surgeon: Jolaine Artist, MD;  Location: [facility] CATH LAB;  Service: Cardiovascular;  Laterality: N/A;    Family History  Problem Relation Age of Onset  . Ovarian cancer Mother   . Lung cancer Mother   . Cervical cancer Mother   . High blood pressure Mother   . Stroke Mother   . Throat cancer Father   . Lupus Sister   . High blood pressure Sister   . Diabetes Son   . High blood pressure Other     Social History   Socioeconomic History  . Marital status: Married    Spouse name: Lataya Varnell  . Number of children: 1  . Years of education: Not on file  . Highest education level: Some college, no degree  Occupational History  . Occupation: Insurance underwriter  . Occupation: Patient Insurance account manager: [employer]  Social Needs  . Financial resource strain: Not on file  . Food insecurity:    Worry: Not on file    Inability: Not on file  . Transportation needs:    Medical: Not on file    Non-medical: Not on file  Tobacco Use  . Smoking status: Never Smoker  . Smokeless  tobacco: Never Used  Substance and Sexual Activity  . Alcohol use: Yes    Comment: occ  . Drug use: No  . Sexual activity: Not on file  Lifestyle  . Physical activity:    Days per week: 2 days    Minutes per session: 60 min  . Stress: Not on file  Relationships  . Social connections:    Talks on phone: Not on file    Gets together: Not on file    Attends religious service: Not on file    Active member of club or organization: Not on file    Attends meetings of clubs or organizations: Not on file    Relationship status: Not on file  . Intimate partner violence:    Fear of current or ex partner: Not on file    Emotionally abused: Not on file    Physically abused: Not on file    Forced sexual activity: Not on file  Other Topics Concern  . Not on file  Social History Narrative  . Not on file    Outpatient Medications Prior to Visit  Medication Sig Dispense Refill  . Cholecalciferol (VITAMIN D3) 5000 units TABS Take 1 tablet by mouth daily.    . famotidine (PEPCID) 20  MG tablet Take 20 mg by mouth 2 (two) times daily.    . Multiple Vitamin (MULTIVITAMIN) tablet Take 1 tablet by mouth daily.    Marland Kitchen omeprazole (PRILOSEC) 20 MG capsule Take by mouth.    . pantoprazole (PROTONIX) 40 MG tablet Take 40 mg by mouth daily.    . TURMERIC PO Take 1,000 mg by mouth daily.     No facility-administered medications prior to visit.     No Known Allergies  ROS Review of Systems    Objective:    Physical Exam  Temp 98.3 F (36.8 C) (Oral)  Wt Readings from Last 3 Encounters:  11/03/18 126 lb (57.2 kg)  09/28/18 123 lb 3.2 oz (55.9 kg)  03/31/18 128 lb (58.1 kg)     Health Maintenance Due  Topic Date Due  . TETANUS/TDAP  12/15/2018  . PNA vac Low Risk Adult (1 of 2 - PCV13) 12/31/2018    There are no preventive care reminders to display for this patient.  Lab Results  Component Value Date   TSH 1.23 09/28/2018   Lab Results  Component Value Date   WBC 3.4 (L)  09/28/2018   HGB 15.1 09/28/2018   HCT 44.9 09/28/2018   MCV 88.9 09/28/2018   PLT 212 09/28/2018   Lab Results  Component Value Date   NA 141 09/28/2018   K 4.6 09/28/2018   CO2 30 09/28/2018   GLUCOSE 97 09/28/2018   BUN 7 09/28/2018   CREATININE 0.66 09/28/2018   BILITOT 1.0 09/28/2018   ALKPHOS 40 03/31/2018   AST 18 09/28/2018   ALT 17 09/28/2018   PROT 7.2 09/28/2018   ALBUMIN 4.8 03/31/2018   CALCIUM 10.0 09/28/2018   ANIONGAP 9 03/31/2018   GFR 122.50 08/12/2017   Lab Results  Component Value Date   CHOL 194 09/28/2018   Lab Results  Component Value Date   HDL 81 09/28/2018   Lab Results  Component Value Date   LDLCALC 96 09/28/2018   Lab Results  Component Value Date   TRIG 80 09/28/2018   Lab Results  Component Value Date   CHOLHDL 2.4 09/28/2018   Lab Results  Component Value Date   HGBA1C 5.7 (H) 05/19/2014      Assessment & Plan:  Vaccines - Patient tolerated injection well without complications.    Problem List Items Addressed This Visit    None    Visit Diagnoses    Need for Tdap vaccination    -  Primary   Relevant Orders   Tdap vaccine greater than or equal to 7yo IM (Completed)   Need for shingles vaccine       Relevant Orders   Varicella-zoster vaccine IM (Shingrix) (Completed)      No orders of the defined types were placed in this encounter.   Follow-up: No follow-ups on file.    Lavell Luster, [place]

## 2019-04-22 IMAGING — CR DG CHEST 2V
2 series · 2 of 2 positions shown · non-contrast
Comparison: 05/01/2017 and prior radiographs

CLINICAL DATA: Acute chest pain today.

EXAM:
CHEST - 2 VIEW

[w chest pa]
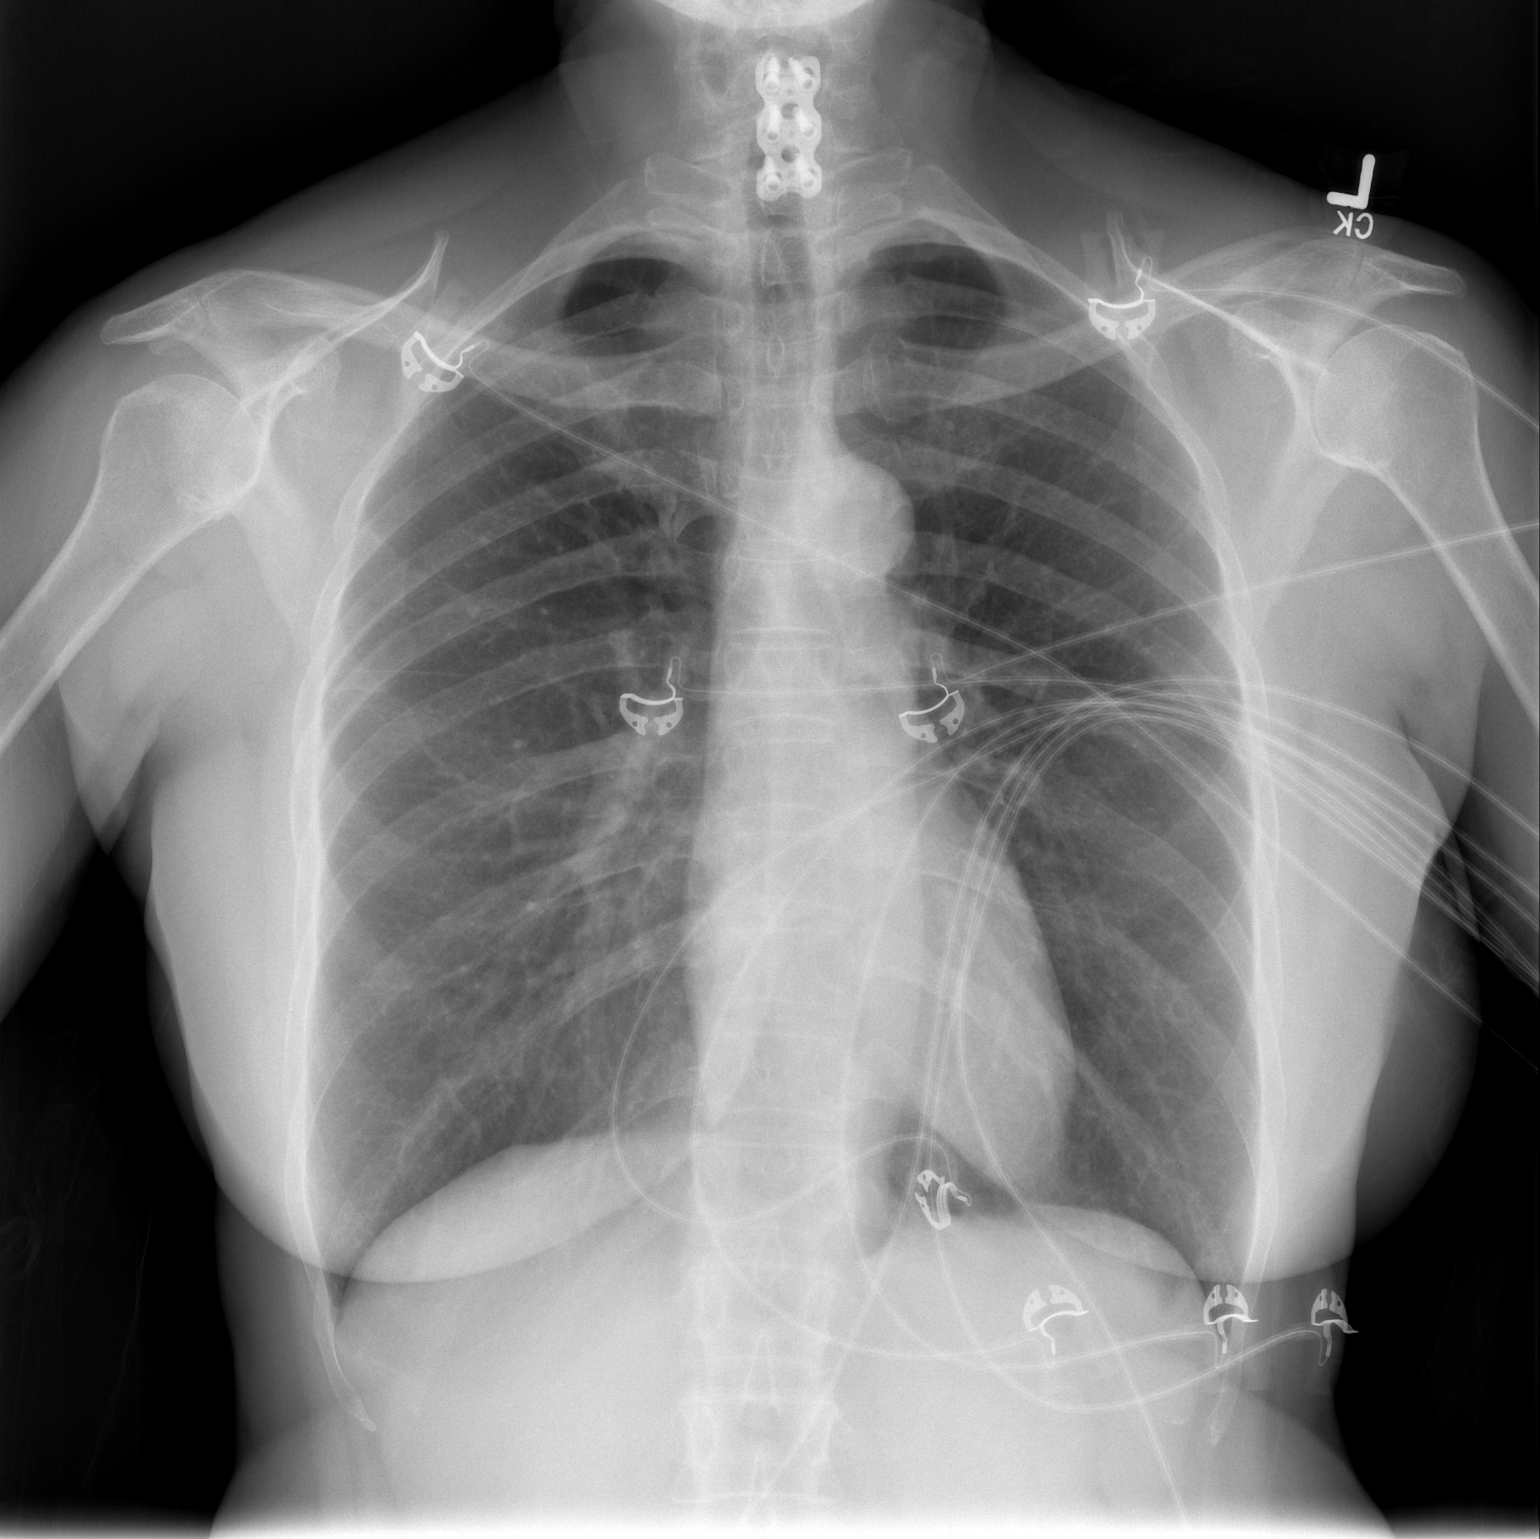

[w chest lat]
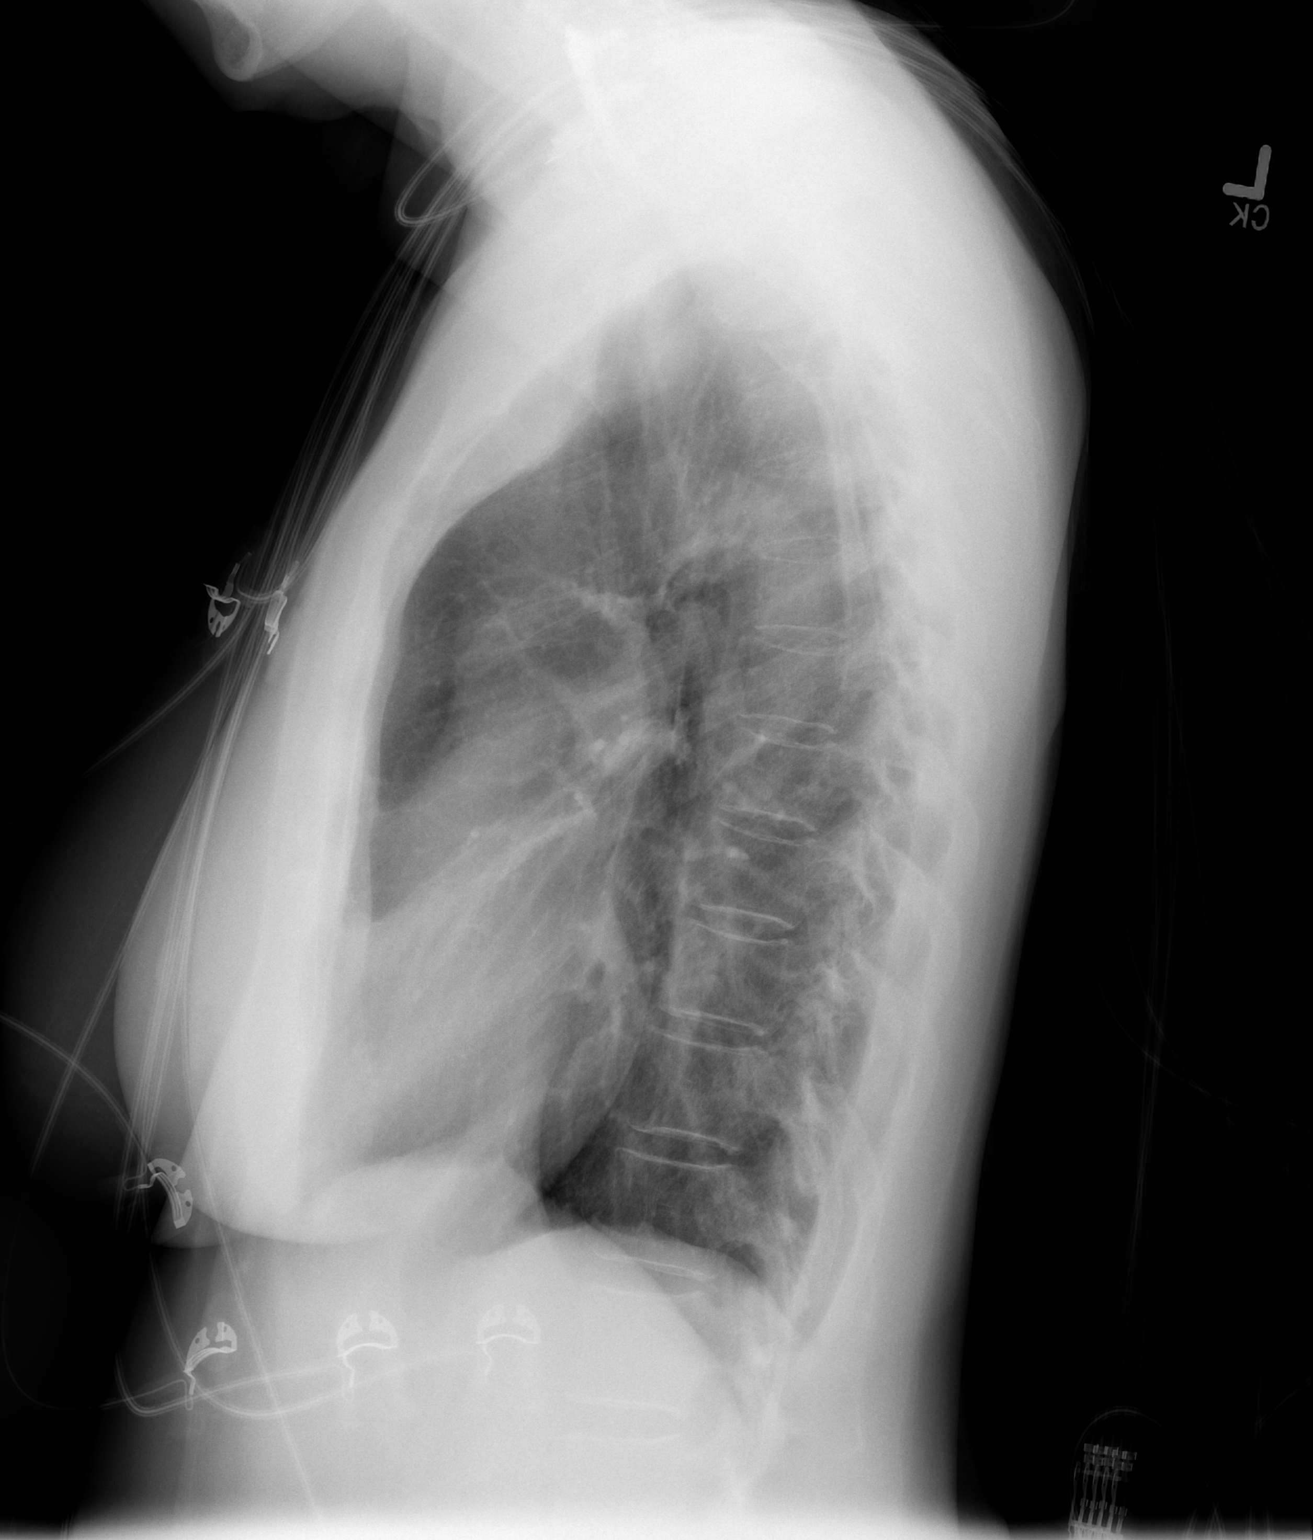

[2 of 2 positions shown; findings below may reference images not displayed]

FINDINGS: The cardiomediastinal silhouette is unremarkable.

There is no evidence of focal airspace disease, pulmonary edema,
suspicious pulmonary nodule/mass, pleural effusion, or pneumothorax.

No acute bony abnormalities are identified.

Cervical spine fusion hardware again noted.
IMPRESSION: No active cardiopulmonary disease.

## 2019-05-04 ENCOUNTER — Other Ambulatory Visit: Payer: Self-pay | Admitting: *Deleted

## 2019-05-04 DIAGNOSIS — Z20822 Contact with and (suspected) exposure to covid-19: Secondary | ICD-10-CM

## 2019-05-04 NOTE — Progress Notes (Signed)
[id number]

## 2019-05-06 LAB — NOVEL CORONAVIRUS, NAA: SARS-CoV-2, NAA: NOT DETECTED

## 2019-05-11 DIAGNOSIS — H5203 Hypermetropia, bilateral: Secondary | ICD-10-CM | POA: Diagnosis not present

## 2019-05-17 DIAGNOSIS — H6122 Impacted cerumen, left ear: Secondary | ICD-10-CM | POA: Diagnosis not present

## 2019-05-17 DIAGNOSIS — K219 Gastro-esophageal reflux disease without esophagitis: Secondary | ICD-10-CM | POA: Diagnosis not present

## 2019-05-19 DIAGNOSIS — K588 Other irritable bowel syndrome: Secondary | ICD-10-CM | POA: Diagnosis not present

## 2019-05-19 DIAGNOSIS — R1013 Epigastric pain: Secondary | ICD-10-CM | POA: Diagnosis not present

## 2019-05-19 DIAGNOSIS — K219 Gastro-esophageal reflux disease without esophagitis: Secondary | ICD-10-CM | POA: Diagnosis not present

## 2019-05-19 MED FILL — DICYCLOMINE 10 MG CAPSULE: 10 | 30 days supply | Qty: 90 | Fill #0

## 2019-06-06 DIAGNOSIS — R350 Frequency of micturition: Secondary | ICD-10-CM | POA: Diagnosis not present

## 2019-06-06 DIAGNOSIS — N898 Other specified noninflammatory disorders of vagina: Secondary | ICD-10-CM | POA: Diagnosis not present

## 2019-06-06 MED FILL — UROGESIC-BLUE TABLET: 81.6 | 6 days supply | Qty: 24 | Fill #0

## 2019-06-06 MED FILL — NITROFURANTOIN MONO-MCR 100: 100 | 7 days supply | Qty: 14 | Fill #0

## 2019-06-06 MED FILL — FLUCONAZOLE 150 MG TABS: 150 | 4 days supply | Qty: 2 | Fill #0

## 2019-06-21 ENCOUNTER — Telehealth: Payer: Self-pay

## 2019-06-21 NOTE — Telephone Encounter (Signed)
Mariah Everett called and left a message stating she would like to get the pneumonia vaccine. Please advise.

## 2019-06-22 NOTE — Telephone Encounter (Signed)
Okay to schedule nurse visit for pneumonia 23

## 2019-06-23 NOTE — Telephone Encounter (Signed)
Patient advised and scheduled.  

## 2019-06-29 ENCOUNTER — Other Ambulatory Visit: Payer: Self-pay

## 2019-06-29 ENCOUNTER — Ambulatory Visit (INDEPENDENT_AMBULATORY_CARE_PROVIDER_SITE_OTHER): Payer: 59 | Admitting: Osteopathic Medicine

## 2019-06-29 VITALS — BP 124/77 | HR 88

## 2019-06-29 DIAGNOSIS — Z23 Encounter for immunization: Secondary | ICD-10-CM | POA: Diagnosis not present

## 2019-06-29 NOTE — Progress Notes (Signed)
Pneumonia 23 given RD with no complications.

## 2019-08-22 ENCOUNTER — Ambulatory Visit (INDEPENDENT_AMBULATORY_CARE_PROVIDER_SITE_OTHER): Payer: 59 | Admitting: Family Medicine

## 2019-08-22 ENCOUNTER — Other Ambulatory Visit: Payer: Self-pay

## 2019-08-22 DIAGNOSIS — Z23 Encounter for immunization: Secondary | ICD-10-CM | POA: Diagnosis not present

## 2019-10-03 ENCOUNTER — Ambulatory Visit (INDEPENDENT_AMBULATORY_CARE_PROVIDER_SITE_OTHER): Payer: 59 | Admitting: Osteopathic Medicine

## 2019-10-03 ENCOUNTER — Encounter: Payer: Self-pay | Admitting: Osteopathic Medicine

## 2019-10-03 ENCOUNTER — Other Ambulatory Visit: Payer: Self-pay

## 2019-10-03 ENCOUNTER — Ambulatory Visit (INDEPENDENT_AMBULATORY_CARE_PROVIDER_SITE_OTHER): Payer: 59

## 2019-10-03 VITALS — BP 129/87 | HR 97 | Wt 129.0 lb

## 2019-10-03 DIAGNOSIS — E782 Mixed hyperlipidemia: Secondary | ICD-10-CM

## 2019-10-03 DIAGNOSIS — E041 Nontoxic single thyroid nodule: Secondary | ICD-10-CM

## 2019-10-03 DIAGNOSIS — M25551 Pain in right hip: Secondary | ICD-10-CM | POA: Diagnosis not present

## 2019-10-03 DIAGNOSIS — M1611 Unilateral primary osteoarthritis, right hip: Secondary | ICD-10-CM | POA: Diagnosis not present

## 2019-10-03 DIAGNOSIS — Z20828 Contact with and (suspected) exposure to other viral communicable diseases: Secondary | ICD-10-CM | POA: Diagnosis not present

## 2019-10-03 DIAGNOSIS — Z9189 Other specified personal risk factors, not elsewhere classified: Secondary | ICD-10-CM

## 2019-10-03 DIAGNOSIS — Z8619 Personal history of other infectious and parasitic diseases: Secondary | ICD-10-CM

## 2019-10-03 DIAGNOSIS — Z20822 Contact with and (suspected) exposure to covid-19: Secondary | ICD-10-CM

## 2019-10-03 DIAGNOSIS — Z0000 Encounter for general adult medical examination without abnormal findings: Secondary | ICD-10-CM

## 2019-10-03 DIAGNOSIS — R7301 Impaired fasting glucose: Secondary | ICD-10-CM | POA: Diagnosis not present

## 2019-10-03 MED ORDER — ASPIRIN EC 81 MG PO TBEC: 81.0000 mg | DELAYED_RELEASE_TABLET | Freq: Every day | ORAL | 3 refills | Status: DC

## 2019-10-03 MED ORDER — OMEPRAZOLE 20 MG PO CPDR: 20.0000 mg | DELAYED_RELEASE_CAPSULE | Freq: Every day | ORAL | 3 refills | Status: DC

## 2019-10-03 MED ORDER — PREDNISONE 20 MG PO TABS: 20.0000 mg | ORAL_TABLET | Freq: Two times a day (BID) | ORAL | 0 refills | Status: DC

## 2019-10-03 MED FILL — OMEPRAZOLE 20 MG CAP: 20 | 90 days supply | Qty: 90 | Fill #0

## 2019-10-03 MED FILL — predniSONE 20 MG TABS: 20 | 5 days supply | Qty: 10 | Fill #0

## 2019-10-03 MED FILL — ASPIRIN 81MG ADULT LOW STRE: 81 | 90 days supply | Qty: 90 | Fill #0

## 2019-10-03 NOTE — Progress Notes (Deleted)
HPI: Mariah Everett is a 63 y.o. female who  has a past medical history of Chronic benign neutropenia ([place]), Esophageal dysmotility, Neck pain, Shoulder pain, and Thyroid nodule.  she presents to [facility] today, 10/03/19,  for chief complaint of: annual visit  Patient would like to know if she could get COVID antibody testing prior to seeing her son with T2DM.   Would also like to know about whether she needs to start taking a baby aspirin.   Hip Pain Patient has had throbbing/aching sensation in right hip for 3-4 days. Only recalls leaning forward while sitting when she began to feel the pain. Pain is located  . Context: . Location: . Quality: . Severity: . Duration:  . Timing: . Modifying factors: . Assoc signs/symptoms:   Patient is accompanied by *** who assists with history-taking.   Past medical, surgical, social and family history reviewed:  Patient Active Problem List   Diagnosis Date Noted  . Somatic complaints, multiple 08/14/2016  . Headache 08/12/2016  . Loss of weight 08/12/2016  . Sciatica of left side 11/10/2014  . Upper airway cough syndrome 11/10/2014  . Chest pain 05/19/2014  . Preventative health care 11/03/2013  . Hyperlipidemia 11/03/2013  . Abdominal pain 12/12/2012  . Rectal bleeding 07/11/2011  . Premature ventricular contraction 07/11/2011  . Other neutropenia ([place]) 07/31/2010  . CERUMEN IMPACTION, BILATERAL 09/03/2009  . THYROID NODULE 05/04/2009  . CERVICAL RADICULOPATHY 12/15/2008    Past Surgical History:  Procedure Laterality Date  . BIOPSY THYROID    . CERVICAL DISCECTOMY  05-18-2009  . CERVICAL FUSION  05-18-2009  . HEMORROIDECTOMY     1990  . LEFT HEART CATHETERIZATION WITH CORONARY ANGIOGRAM N/A 05/19/2014   Procedure: LEFT HEART CATHETERIZATION WITH CORONARY ANGIOGRAM;  Surgeon: Jolaine Artist, MD;  Location: [facility] CATH LAB;  Service: Cardiovascular;  Laterality: N/A;    Social History    Tobacco Use  . Smoking status: Never Smoker  . Smokeless tobacco: Never Used  Substance Use Topics  . Alcohol use: Yes    Comment: occ    Family History  Problem Relation Age of Onset  . Ovarian cancer Mother   . Lung cancer Mother   . Cervical cancer Mother   . High blood pressure Mother   . Stroke Mother   . Throat cancer Father   . Lupus Sister   . High blood pressure Sister   . Diabetes Son   . High blood pressure Other      Current medication list and allergy/intolerance information reviewed:    Current Outpatient Medications  Medication Sig Dispense Refill  . Cholecalciferol (VITAMIN D3) 5000 units TABS Take 1 tablet by mouth daily.    . famotidine (PEPCID) 20 MG tablet Take 20 mg by mouth 2 (two) times daily.    . Multiple Vitamin (MULTIVITAMIN) tablet Take 1 tablet by mouth daily.    Marland Kitchen omeprazole (PRILOSEC) 20 MG capsule Take by mouth.    . pantoprazole (PROTONIX) 40 MG tablet Take 40 mg by mouth daily.    . TURMERIC PO Take 1,000 mg by mouth daily.     No current facility-administered medications for this visit.     No Known Allergies    Review of Systems:  Constitutional:  No  fever, no chills, No recent illness, No unintentional weight changes. No significant fatigue.   HEENT: No  headache, no vision change, no hearing change, No sore throat, No  sinus pressure  Cardiac:  No  chest pain, No  pressure, No palpitations, No  Orthopnea  Respiratory:  No  shortness of breath. No  Cough  Gastrointestinal: No  abdominal pain, No  nausea, No  vomiting,  No  blood in stool, No  diarrhea, No  constipation   Musculoskeletal: No new myalgia/arthralgia  Skin: No  Rash, No other wounds/concerning lesions  Genitourinary: No  incontinence, No  abnormal genital bleeding, No abnormal genital discharge  Hem/Onc: No  easy bruising/bleeding, No  abnormal lymph node  Endocrine: No cold intolerance,  No heat intolerance. No polyuria/polydipsia/polyphagia    Neurologic: No  weakness, No  dizziness, No  slurred speech/focal weakness/facial droop  Psychiatric: No  concerns with depression, No  concerns with anxiety, No sleep problems, No mood problems  Exam:  BP 129/87 (BP Location: Right Arm, Patient Position: Sitting, Cuff Size: Normal)   Pulse 97   Wt 129 lb (58.5 kg)   BMI 21.47 kg/m   Constitutional: VS see above. General Appearance: alert, well-developed, well-nourished, NAD  Eyes: Normal lids and conjunctive, non-icteric sclera  Ears, Nose, Mouth, Throat: MMM, Normal external inspection ears/nares/mouth/lips/gums. TM normal bilaterally. Pharynx/tonsils no erythema, no exudate. Nasal mucosa normal.   Neck: No masses, trachea midline. No thyroid enlargement. No tenderness/mass appreciated. No lymphadenopathy  Respiratory: Normal respiratory effort. no wheeze, no rhonchi, no rales  Cardiovascular: S1/S2 normal, no murmur, no rub/gallop auscultated. RRR. No lower extremity edema. Pedal pulse II/IV bilaterally DP and PT. No carotid bruit or JVD. No abdominal aortic bruit.  Gastrointestinal: Nontender, no masses. No hepatomegaly, no splenomegaly. No hernia appreciated. Bowel sounds normal. Rectal exam deferred.   Musculoskeletal: Gait normal. No clubbing/cyanosis of digits.   Neurological: Normal balance/coordination. No tremor. No cranial nerve deficit on limited exam. Motor and sensation intact and symmetric. Cerebellar reflexes intact.   Skin: warm, dry, intact. No rash/ulcer. No concerning nevi or subq nodules on limited exam.    Psychiatric: Normal judgment/insight. Normal mood and affect. Oriented x3.    No results found for this or any previous visit (from the past 72 hour(s)).  No results found.   ASSESSMENT/PLAN: The primary encounter diagnosis was Annual physical exam. Diagnoses of THYROID NODULE and Mixed hyperlipidemia were also pertinent to this visit.   ***  Orders Placed This Encounter  Procedures  . CBC   . COMPLETE METABOLIC PANEL WITH GFR  . LIPID SCREENING  . TSH    No orders of the defined types were placed in this encounter.   Patient Instructions   General Preventive Care  Most recent routine screening lipids/other labs: o  Everyone should have blood pressure checked once per year.   Tobacco: don't!  Alcohol: responsible moderation is ok for most adults - if you have concerns about your alcohol intake, please talk to me!   Exercise: as tolerated to reduce risk of cardiovascular disease and diabetes. Strength training will also prevent osteoporosis.   Mental health: if need for mental health care (medicines, counseling, other), or concerns about moods, please let me know!   Sexual health: if need for STD testing, or if concerns with libido/pain problems, please let me know!   Advanced Directive: Living Will and/or Healthcare Power of Attorney recommended for all adults, regardless of age or health.  Vaccines  Flu vaccine: recommended for almost everyone, every fall.   Shingles vaccine: Shingrix recommended after age 40.   Pneumonia vaccines: Prevnar and Pneumovax recommended after age 59, or sooner if certain medical conditions.  Tetanus booster: Tdap recommended  every 10 years.  Cancer screenings   Colon cancer screening: recommended for everyone at age 70, but some folks need a colonoscopy sooner if risk factors   Prostate cancer screening: PSA blood test around age 40  Breast cancer screening: mammogram recommended at age 16 every other year at least, and annually after age 62.   Cervical cancer screening: Pap every 1 to 5 years depending on age and other risk factors. Can usually stop at age 91 or w/ hysterectomy.   Lung cancer screening: CT chest every year for those sge 31 to 65 years old with ?30 pack year smoking history, who either currently smoke or have quit within the past 15 years. Family History  Problem Relation Age of Onset  . Ovarian cancer  Mother   . Lung cancer Mother   . Cervical cancer Mother   . High blood pressure Mother   . Stroke Mother   . Throat cancer Father   . Lupus Sister   . High blood pressure Sister   . Diabetes Son   . High blood pressure Other     Infection screenings . HIV: recommended screening at least once age 72-65, more often as needed. . Gonorrhea/Chlamydia: screening as needed, though many insurances require testing for anyone on birth control pills. . Hepatitis C: recommended for anyone born 71-1967 . TB: certain at-risk populations, or depending on work requirements and/or travel history Other . Bone Density Test: recommended for women at age 45 . Abdominal Aortic Aneurysm: screening with ultrasound recommended once for men age 62-75 who have ever smoked         Visit summary with medication list and pertinent instructions was printed for patient to review. All questions at time of visit were answered - patient instructed to contact office with any additional concerns or updates. ER/RTC precautions were reviewed with the patient.   Note: Total time spent *** minutes, greater than 50% of the visit was spent face-to-face counseling and coordinating care for the above diagnoses listed in assessment/plan.   Please note: voice recognition software was used to produce this document, and typos may escape review. Please contact Dr. Sheppard Coil for any needed clarifications.     Follow-up plan: No follow-ups on file.

## 2019-10-03 NOTE — Patient Instructions (Addendum)
General Preventive Care  Most recent routine screening lipids/other labs: ordered today   Everyone should have blood pressure checked once per year.   Tobacco: don't!  Alcohol: responsible moderation is ok for most adults - if you have concerns about your alcohol intake, please talk to me!   Exercise: as tolerated to reduce risk of cardiovascular disease and diabetes. Strength training will also prevent osteoporosis.   Mental health: if need for mental health care (medicines, counseling, other), or concerns about moods, please let me know!   Sexual health: if need for STD testing, or if concerns with libido/pain problems, please let me know!   Advanced Directive: Living Will and/or Healthcare Power of Attorney recommended for all adults, regardless of age or health.  Vaccines  Flu vaccine: recommended for almost everyone, every fall.   Shingles vaccine: Shingrix recommended after age 43.   Pneumonia vaccines: Prevnar and Pneumovax recommended after age 71, or sooner if certain medical conditions.  Tetanus booster: Tdap recommended every 10 years.  Cancer screenings   Colon cancer screening: due 2022  Breast cancer screening: mammogram recommended at age 9 every other year at least, and annually after age 71.   Cervical cancer screening: Pap every 1 to 5 years depending on age and other risk factors. Can usually stop at age 59 or w/ hysterectomy.   Lung cancer screening: not needed for non-smokers  Infection screenings . HIV: recommended screening at least once age 1-65, more often as needed. . Gonorrhea/Chlamydia: screening as needed. . Hepatitis C: recommended for anyone born 110-1967 . TB: certain at-risk populations, or depending on work requirements and/or travel history Other . Bone Density Test: recommended for women at age 89

## 2019-10-03 NOTE — Progress Notes (Signed)
HPI: Mariah Everett is a 63 y.o. female who  has a past medical history of Chronic benign neutropenia ([place]), Esophageal dysmotility, Neck pain, Shoulder pain, and Thyroid nodule.  she presents to [facility] today, 10/03/19,  for chief complaint of: Annual physical     Patient here for annual physical / wellness exam.  See preventive care reviewed as below.  Recent labs reviewed in detail with the patient.   Additional concerns today include:  Hip pain . Context: no injury . Location: R hip and groin  . Quality: sore, occasionally sharp . Duration: 3-4 days . Timing: constant, worse with twisting  . Modifying factors: Ibuprofen helping some     Past medical, surgical, social and family history reviewed:  Patient Active Problem List   Diagnosis Date Noted  . Somatic complaints, multiple 08/14/2016  . Headache 08/12/2016  . Loss of weight 08/12/2016  . Sciatica of left side 11/10/2014  . Upper airway cough syndrome 11/10/2014  . Chest pain 05/19/2014  . Preventative health care 11/03/2013  . Hyperlipidemia 11/03/2013  . Abdominal pain 12/12/2012  . Rectal bleeding 07/11/2011  . Premature ventricular contraction 07/11/2011  . Other neutropenia ([place]) 07/31/2010  . CERUMEN IMPACTION, BILATERAL 09/03/2009  . THYROID NODULE 05/04/2009  . CERVICAL RADICULOPATHY 12/15/2008    Past Surgical History:  Procedure Laterality Date  . BIOPSY THYROID    . CERVICAL DISCECTOMY  05-18-2009  . CERVICAL FUSION  05-18-2009  . HEMORROIDECTOMY     1990  . LEFT HEART CATHETERIZATION WITH CORONARY ANGIOGRAM N/A 05/19/2014   Procedure: LEFT HEART CATHETERIZATION WITH CORONARY ANGIOGRAM;  Surgeon: Jolaine Artist, MD;  Location[facility] C[facility] LAB;  Service: Cardiovascular;  Laterality: N/A;    Social History   Tobacco Use  . Smoking status: Never Smoker  . Smokeless tobacco: Never Used  Substance Use Topics  . Alcohol use: Yes    Comment: occ     Family History  Problem Relation Age of Onset  . Ovarian cancer Mother   . Lung cancer Mother   . Cervical cancer Mother   . High blood pressure Mother   . Stroke Mother   . Throat cancer Father   . Lupus Sister   . High blood pressure Sister   . Diabetes Son   . High blood pressure Other      Current medication list and allergy/intolerance information reviewed:    Current Outpatient Medications  Medication Sig Dispense Refill  . Cholecalciferol (VITAMIN D3) 5000 units TABS Take 1 tablet by mouth daily.    . famotidine (PEPCID) 20 MG tablet Take 20 mg by mouth 2 (two) times daily.    . Multiple Vitamin (MULTIVITAMIN) tablet Take 1 tablet by mouth daily.    Marland Kitchen omeprazole (PRILOSEC) 20 MG capsule Take by mouth.    . pantoprazole (PROTONIX) 40 MG tablet Take 40 mg by mouth daily.    . TURMERIC PO Take 1,000 mg by mouth daily.     No current facility-administered medications for this visit.     No Known Allergies    Review of Systems:  Constitutional:  No  fever, no chills, No recent illness, No unintentional weight changes. No significant fatigue.   HEENT: No  headache, no vision change, no hearing change, No sore throat, No  sinus pressure  Cardiac: No  chest pain, No  pressure, No palpitations, No  Orthopnea  Respiratory:  No  shortness of breath. No  Cough  Gastrointestinal: No  abdominal pain, No  nausea, No  vomiting,  No  blood in stool, No  diarrhea, No  constipation   Musculoskeletal: +new myalgia/arthralgia  Skin: No  Rash, No other wounds/concerning lesions  Genitourinary: No  incontinence, No  abnormal genital bleeding, No abnormal genital discharge  Hem/Onc: No  easy bruising/bleeding  Neurologic: No  weakness, No  dizziness, No  slurred speech/focal weakness/facial droop  Psychiatric: No  concerns with depression, No  concerns with anxiety  Exam:  BP 129/87 (BP Location: Right Arm, Patient Position: Sitting, Cuff Size: Normal)   Pulse 97   Wt  129 lb (58.5 kg)   BMI 21.47 kg/m   Constitutional: VS see above. General Appearance: alert, well-developed, well-nourished, NAD  Eyes: Normal lids and conjunctive, non-icteric sclera  Neck: No masses, trachea midline. No thyroid enlargement. No tenderness/mass appreciated. No lymphadenopathy  Respiratory: Normal respiratory effort. no wheeze, no rhonchi, no rales  Cardiovascular: S1/S2 normal, no murmur, no rub/gallop auscultated. RRR. No lower extremity edema.   Gastrointestinal: Nontender, no masses. No hepatomegaly, no splenomegaly. No hernia appreciated. Bowel sounds normal. Rectal exam deferred.   Musculoskeletal: Gait normal. No clubbing/cyanosis of digits. NoR  femoral trochanteric pain, (+)FABER and FADIR pain on R, neg SLR bilateraly    Neurological: Normal balance/coordination. No tremor.   Skin: warm, dry, intact. No rash/ulcer. No concerning nevi or subq nodules on limited exam.    Psychiatric: Normal judgment/insight. Normal mood and affect. Oriented x3.    No results found for this or any previous visit (from the past 72 hour(s)).  No results found.   ASSESSMENT/PLAN: The primary encounter diagnosis was Annual physical exam. Diagnoses of THYROID NODULE, Mixed hyperlipidemia, At risk for osteoporosis, History of viral illness, Right hip pain, and Encounter for screening laboratory testing for COVID-19 virus were also pertinent to this visit.   Orders Placed This Encounter  Procedures  . DG Bone Density  . DG Hip Unilat W OR W/O Pelvis 2-3 Views Right  . CBC  . COMPLETE METABOLIC PANEL WITH GFR  . LIPID SCREENING  . TSH  . SAR CoV2 Serology (COVID 19)AB(IGG)IA   The 10-year ASCVD risk score Mikey Bussing DC Jr., et al., 2013) is: 7.2%   Values used to calculate the score:     Age: 66 years     Sex: Female     Is Non-Hispanic African American: Yes     Diabetic: No     Tobacco smoker: No     Systolic Blood Pressure: [id number] mmHg     Is BP treated: No     HDL  Cholesterol: 81 mg/dL     Total Cholesterol: 194 mg/dL   Meds ordered this encounter  Medications  . aspirin EC 81 MG tablet    Sig: Take 1 tablet (81 mg total) by mouth daily.    Dispense:  90 tablet    Refill:  3  . omeprazole (PRILOSEC) 20 MG capsule    Sig: Take 1 capsule (20 mg total) by mouth daily.    Dispense:  90 capsule    Refill:  3  . predniSONE (DELTASONE) 20 MG tablet    Sig: Take 1 tablet (20 mg total) by mouth 2 (two) times daily with a meal.    Dispense:  10 tablet    Refill:  0    Patient Instructions  General Preventive Care  Most recent routine screening lipids/other labs: ordered today   Everyone should have blood pressure checked once per year.   Tobacco:  don't!  Alcohol: responsible moderation is ok for most adults - if you have concerns about your alcohol intake, please talk to me!   Exercise: as tolerated to reduce risk of cardiovascular disease and diabetes. Strength training will also prevent osteoporosis.   Mental health: if need for mental health care (medicines, counseling, other), or concerns about moods, please let me know!   Sexual health: if need for STD testing, or if concerns with libido/pain problems, please let me know!   Advanced Directive: Living Will and/or Healthcare Power of Attorney recommended for all adults, regardless of age or health.  Vaccines  Flu vaccine: recommended for almost everyone, every fall.   Shingles vaccine: Shingrix recommended after age 53.   Pneumonia vaccines: Prevnar and Pneumovax recommended after age 61, or sooner if certain medical conditions.  Tetanus booster: Tdap recommended every 10 years.  Cancer screenings   Colon cancer screening: due 2022  Breast cancer screening: mammogram recommended at age 40 every other year at least, and annually after age 71.   Cervical cancer screening: Pap every 1 to 5 years depending on age and other risk factors. Can usually stop at age 92 or w/ hysterectomy.    Lung cancer screening: not needed for non-smokers  Infection screenings . HIV: recommended screening at least once age 42-65, more often as needed. . Gonorrhea/Chlamydia: screening as needed. . Hepatitis C: recommended for anyone born 34-1967 . TB: certain at-risk populations, or depending on work requirements and/or travel history Other . Bone Density Test: recommended for women at age 65       Visit summary with medication list and pertinent instructions was printed for patient to review. All questions at time of visit were answered - patient instructed to contact office with any additional concerns or updates. ER/RTC precautions were reviewed with the patient.      Please note: voice recognition software was used to produce this document, and typos may escape review. Please contact Dr. Sheppard Coil for any needed clarifications.     Follow-up plan: Return in about 1 year (around 10/02/2020) for [place] (call week prior to visit for lab orders).

## 2019-10-04 LAB — SAR COV2 SEROLOGY (COVID19)AB(IGG),IA: SARS CoV2 AB IGG: NEGATIVE

## 2019-10-05 ENCOUNTER — Other Ambulatory Visit: Payer: Self-pay

## 2019-10-05 ENCOUNTER — Ambulatory Visit (INDEPENDENT_AMBULATORY_CARE_PROVIDER_SITE_OTHER): Payer: 59

## 2019-10-05 DIAGNOSIS — M85852 Other specified disorders of bone density and structure, left thigh: Secondary | ICD-10-CM | POA: Diagnosis not present

## 2019-10-05 DIAGNOSIS — Z780 Asymptomatic menopausal state: Secondary | ICD-10-CM | POA: Diagnosis not present

## 2019-10-05 DIAGNOSIS — Z1231 Encounter for screening mammogram for malignant neoplasm of breast: Secondary | ICD-10-CM | POA: Diagnosis not present

## 2019-10-05 DIAGNOSIS — Z9189 Other specified personal risk factors, not elsewhere classified: Secondary | ICD-10-CM

## 2019-10-05 DIAGNOSIS — Z01419 Encounter for gynecological examination (general) (routine) without abnormal findings: Secondary | ICD-10-CM | POA: Diagnosis not present

## 2019-10-05 LAB — COMPLETE METABOLIC PANEL WITH GFR
AG Ratio: 1.8 (calc) (ref 1.0–2.5)
ALT: 24 U/L (ref 6–29)
AST: 22 U/L (ref 10–35)
Albumin: 4.8 g/dL (ref 3.6–5.1)
Alkaline phosphatase (APISO): 40 U/L (ref 37–153)
BUN: 9 mg/dL (ref 7–25)
CO2: 28 mmol/L (ref 20–32)
Calcium: 10.1 mg/dL (ref 8.6–10.4)
Chloride: 104 mmol/L (ref 98–110)
Creat: 0.67 mg/dL (ref 0.50–0.99)
GFR, Est African American: 107 mL/min/{1.73_m2} (ref >=60)
GFR, Est Non African American: 92 mL/min/{1.73_m2} (ref >=60)
Globulin: 2.6 g/dL (calc) (ref 1.9–3.7)
Glucose, Bld: 102 mg/dL — ABNORMAL HIGH (ref 65–99)
Potassium: 4.3 mmol/L (ref 3.5–5.3)
Sodium: 141 mmol/L (ref 135–146)
Total Bilirubin: 0.7 mg/dL (ref 0.2–1.2)
Total Protein: 7.4 g/dL (ref 6.1–8.1)

## 2019-10-05 LAB — SPECIMEN STATUS REPORT

## 2019-10-05 LAB — CBC
HCT: 45.5 % — ABNORMAL HIGH (ref 35.0–45.0)
Hemoglobin: 15.2 g/dL (ref 11.7–15.5)
MCH: 30.5 pg (ref 27.0–33.0)
MCHC: 33.4 g/dL (ref 32.0–36.0)
MCV: 91.2 fL (ref 80.0–100.0)
MPV: 11.1 fL (ref 7.5–12.5)
Platelets: 217 10*3/uL (ref 140–400)
RBC: 4.99 10*6/uL (ref 3.80–5.10)
RDW: 12.4 % (ref 11.0–15.0)
WBC: 3.1 10*3/uL — ABNORMAL LOW (ref 3.8–10.8)

## 2019-10-05 LAB — LIPID PANEL
Cholesterol: 201 mg/dL — ABNORMAL HIGH (ref <200)
HDL: 86 mg/dL (ref >=50)
LDL Cholesterol (Calc): 99 mg/dL (calc)
Non-HDL Cholesterol (Calc): 115 mg/dL (calc) (ref <130)
Total CHOL/HDL Ratio: 2.3 (calc) (ref <5.0)
Triglycerides: 72 mg/dL (ref <150)

## 2019-10-05 LAB — NOVEL CORONAVIRUS, NAA: SARS-CoV-2, NAA: NOT DETECTED

## 2019-10-05 LAB — HEMOGLOBIN A1C W/OUT EAG: Hgb A1c MFr Bld: 5.2 % of total Hgb (ref <5.7)

## 2019-10-05 LAB — HM MAMMOGRAPHY

## 2019-10-05 LAB — TSH: TSH: 1.54 mIU/L (ref 0.40–4.50)

## 2019-10-06 ENCOUNTER — Encounter: Payer: Self-pay | Admitting: Osteopathic Medicine

## 2019-12-07 ENCOUNTER — Other Ambulatory Visit: Payer: Self-pay

## 2019-12-07 ENCOUNTER — Ambulatory Visit (INDEPENDENT_AMBULATORY_CARE_PROVIDER_SITE_OTHER): Payer: 59 | Admitting: Physician Assistant

## 2019-12-07 VITALS — BP 142/91 | HR 92 | Ht 65.0 in | Wt 127.0 lb

## 2019-12-07 DIAGNOSIS — F0151 Vascular dementia with behavioral disturbance: Secondary | ICD-10-CM | POA: Diagnosis not present

## 2019-12-07 DIAGNOSIS — D229 Melanocytic nevi, unspecified: Secondary | ICD-10-CM | POA: Insufficient documentation

## 2019-12-07 DIAGNOSIS — L609 Nail disorder, unspecified: Secondary | ICD-10-CM | POA: Diagnosis not present

## 2019-12-07 DIAGNOSIS — I6782 Cerebral ischemia: Secondary | ICD-10-CM | POA: Diagnosis not present

## 2019-12-07 NOTE — Progress Notes (Signed)
   Subjective:    Patient ID: Mariah Everett, female    DOB: Nov 28, 1955, 64 y.o.   MRN: [id number]  HPI  Patient is a 64 year old female who presents to the clinic with concerns regarding her right great toenail.  She took toenail polish off and found a dark spot on her great right toenail.  Over the last month it has continued to get bigger and growing outward.  She denies any toe injury.  She denies any personal or family history of melanoma.  She does support some radiation of pain coming down toe into dorsal foot.  .. Active Ambulatory Problems    Diagnosis Date Noted  . THYROID NODULE 05/04/2009  . Other neutropenia ([place]) 07/31/2010  . CERUMEN IMPACTION, BILATERAL 09/03/2009  . CERVICAL RADICULOPATHY 12/15/2008  . Rectal bleeding 07/11/2011  . Premature ventricular contraction 07/11/2011  . Abdominal pain 12/12/2012  . Preventative health care 11/03/2013  . Hyperlipidemia 11/03/2013  . Chest pain 05/19/2014  . Sciatica of left side 11/10/2014  . Upper airway cough syndrome 11/10/2014  . Headache 08/12/2016  . Loss of weight 08/12/2016  . Somatic complaints, multiple 08/14/2016  . Melanocytic neoplasm of skin 12/07/2019  . Nail abnormality 12/07/2019   Resolved Ambulatory Problems    Diagnosis Date Noted  . Cough 08/11/2008  . INSECT BITE 04/26/2010   Past Medical History:  Diagnosis Date  . Chronic benign neutropenia (HCC)   . Esophageal dysmotility   . Neck pain   . Shoulder pain   . Thyroid nodule      Review of Systems See HPI.     Objective:   Physical Exam Vitals reviewed.  Constitutional:      Appearance: Normal appearance.  Skin:    Comments: 96mm hyperpigmented, asymmetric macule with irregular borders located under lateral right great toenail. No redness, tenderness, swelling. Good 2 plus pedal pulses.   Neurological:     General: No focal deficit present.     Mental Status: She is alert and oriented to person, place, and time.            Assessment & Plan:  Marland KitchenMarland KitchenMarijke was seen today for nail problem.  Diagnoses and all orders for this visit:  Melanocytic neoplasm of skin -     Ambulatory referral to Dermatology  Nail abnormality -     Ambulatory referral to Dermatology   Unclear etiology of melanocytic lesion under right great toe.  It is a little concerning that it seems to be growing so rapidly and has irregular borders as well as asymmetric color.  I would like to send her to dermatology for further evaluation and possible biopsy.

## 2019-12-26 DIAGNOSIS — L718 Other rosacea: Secondary | ICD-10-CM | POA: Diagnosis not present

## 2020-01-31 DIAGNOSIS — M542 Cervicalgia: Secondary | ICD-10-CM | POA: Diagnosis not present

## 2020-01-31 DIAGNOSIS — G629 Polyneuropathy, unspecified: Secondary | ICD-10-CM | POA: Insufficient documentation

## 2020-01-31 DIAGNOSIS — M5412 Radiculopathy, cervical region: Secondary | ICD-10-CM | POA: Diagnosis not present

## 2020-01-31 DIAGNOSIS — Z981 Arthrodesis status: Secondary | ICD-10-CM | POA: Diagnosis not present

## 2020-01-31 DIAGNOSIS — G64 Other disorders of peripheral nervous system: Secondary | ICD-10-CM | POA: Diagnosis not present

## 2020-01-31 MED FILL — GABAPENTIN 300 MG CAPSULE: 300 | 30 days supply | Qty: 30 | Fill #0

## 2020-02-04 DIAGNOSIS — M79601 Pain in right arm: Secondary | ICD-10-CM | POA: Diagnosis not present

## 2020-02-04 DIAGNOSIS — I10 Essential (primary) hypertension: Secondary | ICD-10-CM | POA: Diagnosis not present

## 2020-02-04 DIAGNOSIS — I2510 Atherosclerotic heart disease of native coronary artery without angina pectoris: Secondary | ICD-10-CM | POA: Diagnosis not present

## 2020-02-04 DIAGNOSIS — E782 Mixed hyperlipidemia: Secondary | ICD-10-CM | POA: Diagnosis not present

## 2020-02-06 DIAGNOSIS — K921 Melena: Secondary | ICD-10-CM | POA: Diagnosis not present

## 2020-02-06 DIAGNOSIS — K219 Gastro-esophageal reflux disease without esophagitis: Secondary | ICD-10-CM | POA: Diagnosis not present

## 2020-02-08 DIAGNOSIS — M5412 Radiculopathy, cervical region: Secondary | ICD-10-CM | POA: Diagnosis not present

## 2020-02-14 DIAGNOSIS — Z981 Arthrodesis status: Secondary | ICD-10-CM | POA: Diagnosis not present

## 2020-02-14 DIAGNOSIS — M5412 Radiculopathy, cervical region: Secondary | ICD-10-CM | POA: Diagnosis not present

## 2020-02-20 DIAGNOSIS — M5412 Radiculopathy, cervical region: Secondary | ICD-10-CM | POA: Diagnosis not present

## 2020-02-29 DIAGNOSIS — M79641 Pain in right hand: Secondary | ICD-10-CM | POA: Diagnosis not present

## 2020-02-29 DIAGNOSIS — G5601 Carpal tunnel syndrome, right upper limb: Secondary | ICD-10-CM | POA: Diagnosis not present

## 2020-03-12 DIAGNOSIS — K921 Melena: Secondary | ICD-10-CM | POA: Diagnosis not present

## 2020-03-12 DIAGNOSIS — Z8371 Family history of colonic polyps: Secondary | ICD-10-CM | POA: Diagnosis not present

## 2020-03-12 DIAGNOSIS — K621 Rectal polyp: Secondary | ICD-10-CM | POA: Diagnosis not present

## 2020-03-12 LAB — HM COLONOSCOPY

## 2020-03-16 ENCOUNTER — Encounter: Payer: Self-pay | Admitting: Osteopathic Medicine

## 2020-03-30 DIAGNOSIS — Z9889 Other specified postprocedural states: Secondary | ICD-10-CM | POA: Diagnosis not present

## 2020-03-30 DIAGNOSIS — K648 Other hemorrhoids: Secondary | ICD-10-CM | POA: Diagnosis not present

## 2020-05-11 DIAGNOSIS — H524 Presbyopia: Secondary | ICD-10-CM | POA: Diagnosis not present

## 2020-08-23 ENCOUNTER — Ambulatory Visit: Payer: 59 | Admitting: Nurse Practitioner

## 2020-08-28 ENCOUNTER — Ambulatory Visit: Payer: 59 | Attending: Internal Medicine

## 2020-08-28 ENCOUNTER — Other Ambulatory Visit (HOSPITAL_BASED_OUTPATIENT_CLINIC_OR_DEPARTMENT_OTHER): Payer: Self-pay | Admitting: Internal Medicine

## 2020-08-28 DIAGNOSIS — Z23 Encounter for immunization: Secondary | ICD-10-CM

## 2020-08-31 ENCOUNTER — Other Ambulatory Visit: Payer: Self-pay

## 2020-08-31 ENCOUNTER — Encounter: Payer: Self-pay | Admitting: Nurse Practitioner

## 2020-08-31 ENCOUNTER — Ambulatory Visit (INDEPENDENT_AMBULATORY_CARE_PROVIDER_SITE_OTHER): Payer: 59 | Admitting: Nurse Practitioner

## 2020-08-31 ENCOUNTER — Ambulatory Visit: Payer: 59 | Admitting: Nurse Practitioner

## 2020-08-31 VITALS — BP 123/82 | HR 95 | Ht 65.0 in | Wt 127.0 lb

## 2020-08-31 DIAGNOSIS — Z23 Encounter for immunization: Secondary | ICD-10-CM

## 2020-08-31 DIAGNOSIS — Z0000 Encounter for general adult medical examination without abnormal findings: Secondary | ICD-10-CM | POA: Diagnosis not present

## 2020-08-31 NOTE — Progress Notes (Signed)
Subjective:   Mariah Everett is a 64 y.o. female who presents for an Initial Medicare Annual Wellness Visit.  Review of Systems    Neuro: Denies difficulty remembering daily tasks, people, or places.  Ear: Denies difficulty hearing or need to increase volume on television or telephone to hear Eye: Denies visual changes, difficulty reading normal print, or visual field deficits. Cardiac: Deniespalpitations, dizziness, shortness of breath, pain in lower extremities, or night time waking with shortness of breath. Endorses some chest pain associated with GERD.  Lung: Denies shortness of breath, difficulty breathing, or dizziness. Endorses chronic cough.  GI: Denies changes in bowel habits, blood in stool, difficulty passing stool, decreased intake of food or drink, vomiting. Endorses nausea intermittently with GERD symptoms.  GU: Denies changes in urinary habits, dark urine, malodorous urine, increased or decreased urination, or urinary incontinence.  MSK: Denies weakness in extremities, difficulty walking, difficulty grasping, or new MSK pain.  Skin: Denies changes to the skin, fragile skin, or increased bruising.  Constitution: Denies fatigue, weakness, or confusion.        Objective:    Today's Vitals   08/31/20 0849  BP: 123/82  Pulse: 95  SpO2: 100%  Weight: 127 lb (57.6 kg)  Height: 5\' 5"  (1.651 m)   Body mass index is 21.13 kg/m.  Advanced Directives 03/31/2018  Does Patient Have a Medical Advance Directive? No    Current Medications (verified) Outpatient Encounter Medications as of 08/31/2020  Medication Sig  . CALCIUM-MAGNESIUM-ZINC PO Take 1 tablet by mouth.  . famotidine (PEPCID) 20 MG tablet Take 20 mg by mouth 2 (two) times daily.  . Multiple Vitamin (MULTIVITAMIN) tablet Take 1 tablet by mouth daily.  Marland Kitchen omeprazole (PRILOSEC) 20 MG capsule Take 1 capsule (20 mg total) by mouth daily.  . TURMERIC PO Take 1,000 mg by mouth daily.  . [DISCONTINUED] aspirin EC 81 MG  tablet Take 1 tablet (81 mg total) by mouth daily.  . [DISCONTINUED] Cholecalciferol (VITAMIN D3) 5000 units TABS Take 1 tablet by mouth daily.   No facility-administered encounter medications on file as of 08/31/2020.    Allergies (verified) Patient has no known allergies.   History: Past Medical History:  Diagnosis Date  . CERUMEN IMPACTION, BILATERAL 09/03/2009   Qualifier: Diagnosis of  By: Royal Piedra NP, Tammy    . CERVICAL RADICULOPATHY 12/15/2008   - MRI 12/22/2008 Pos C7 disc protrusion > Discectomy and fusion 05/18/2009 [facility] in [place]     . Chronic benign neutropenia (HCC)   . Esophageal dysmotility   . Loss of weight 08/12/2016  . Neck pain    radicular C7/8 right. MRI 12-22-18 pos C7 disc protrusion  . Preventative health care 11/03/2013   Followed as Primary Care Patient/ [facility]/ [facility]    . Rectal bleeding 07/11/2011   Negative colonoscopy 09/12/11  Dr Genelle Gather   . Sciatica of left side 11/10/2014   Followed as Primary Care Patient/ [facility]/ [facility] > referred  Back to [facility]    . Shoulder pain   . Thyroid nodule    a. Bx/TFTs normal by prior workup, clinically benign.   Past Surgical History:  Procedure Laterality Date  . BIOPSY THYROID    . CERVICAL DISCECTOMY  05-18-2009  . CERVICAL FUSION  05-18-2009  . HEMORROIDECTOMY     1990  . LEFT HEART CATHETERIZATION WITH CORONARY ANGIOGRAM N/A 05/19/2014   Procedure: LEFT HEART CATHETERIZATION WITH CORONARY ANGIOGRAM;  Surgeon: Jolaine Artist, MD;  Location[facility] CATH  LAB;  Service: Cardiovascular;  Laterality: N/A;   Family History  Problem Relation Age of Onset  . Ovarian cancer Mother   . Lung cancer Mother   . Cervical cancer Mother   . High blood pressure Mother   . Stroke Mother   . Throat cancer Father   . Lupus Sister   . High blood pressure Sister   . Diabetes Son   . High blood pressure Other    Social History   Socioeconomic History  . Marital status: Married    Spouse name: Evey Mcmahan  . Number of children: 1  . Years of education: Not on file  . Highest education level: Some college, no degree  Occupational History  . Occupation: Insurance underwriter  . Occupation: Patient Insurance account manager: [place]  Tobacco Use  . Smoking status: Never Smoker  . Smokeless tobacco: Never Used  Vaping Use  . Vaping Use: Never used  Substance and Sexual Activity  . Alcohol use: Not Currently    Comment: occ  . Drug use: No  . Sexual activity: Yes    Partners: Male    Birth control/protection: None, Post-menopausal  Other Topics Concern  . Not on file  Social History Narrative  . Not on file   Social Determinants of Health   Financial Resource Strain: Low Risk   . Difficulty of Paying Living Expenses: Not hard at all  Food Insecurity: No Food Insecurity  . Worried About Charity fundraiser in the Last Year: Never true  . Ran Out of Food in the Last Year: Never true  Transportation Needs: No Transportation Needs  . Lack of Transportation (Medical): No  . Lack of Transportation (Non-Medical): No  Physical Activity: Insufficiently Active  . Days of Exercise per Week: 4 days  . Minutes of Exercise per Session: 20 min  Stress: No Stress Concern Present  . Feeling of Stress : Not at all  Social Connections: Socially Integrated  . Frequency of Communication with Friends and Family: Twice a week  . Frequency of Social Gatherings with Friends and Family: More than three times a week  . Attends Religious Services: 1 to 4 times per year  . Active Member of Clubs or Organizations: Yes  . Attends Archivist Meetings: 1 to 4 times per year  . Marital Status: Married    Tobacco Counseling Counseling given: No   Clinical Intake:  Pre-visit preparation completed: Yes  Pain : No/denies pain     BMI - recorded: 21.13 Nutritional Status: BMI of 19-24  Normal Nutritional Risks: None Diabetes: No  How often do you need to have someone  help you when you read instructions, pamphlets, or other written materials from your doctor or pharmacy?: 1 - Never  Diabetic?No  Interpreter Needed?: No  Information entered by :: S. Nehal Witting, AGNP-c   Activities of Daily Living No flowsheet data found.  Patient Care Team: Emeterio Reeve, DO as PCP - General (Osteopathic Medicine)  Indicate any recent Medical Services you may have received from other than [facility] providers in the past year (date may be approximate).     Assessment:   This is a routine wellness examination for Dotsie.  Hearing/Vision screen No exam data present  Dietary issues and exercise activities discussed: Current Exercise Habits: Home exercise routine  Goals    .  Exercise 4x a week (pt-stated)      Exercise 4 days a week for at least 30  minutes at a time.  Start walking in the treadmill at the gym.       Depression Screen PHQ 2/9 Scores 10/03/2019 09/28/2018 12/22/2017 01/26/2017  PHQ - 2 Score 0 3 0 0  PHQ- 9 Score - 5 - -    Fall Risk Fall Risk  01/26/2017  Falls in the past year? No    Any stairs in or around the home? Yes  If so, are there any without handrails? No  Home free of loose throw rugs in walkways, pet beds, electrical cords, etc? Yes Adequate lighting in your home to reduce risk of falls? Yes   ASSISTIVE DEVICES UTILIZED TO PREVENT FALLS:  Life alert? No  Use of a cane, walker or w/c? No  Grab bars in the bathroom? No  Shower chair or bench in shower? No  Elevated toilet seat or a handicapped toilet? Yes   TIMED UP AND GO:  Was the test performed? Yes .  Length of time to ambulate 10 feet: 3 sec.   Gait steady and fast without use of assistive device  Cognitive Function:     6CIT Screen 08/31/2020  What Year? 0 points  What month? 0 points  What time? 0 points  Count back from 20 0 points  Months in reverse 0 points  Repeat phrase 6 points  Total Score 6    Immunizations Immunization History  Administered  Date(s) Administered  . Fluad Quad(high Dose 65+) 08/22/2019, 08/31/2020  . Influenza Whole 07/25/2012  . Influenza,inj,Quad PF,6+ Mos 09/22/2013, 11/07/2014  . Influenza-Unspecified 11/13/2015, 08/22/2016, 09/24/2017, 07/25/2018, 09/09/2018  . PFIZER SARS-COV-2 Vaccination 08/28/2020  . Pneumococcal Polysaccharide-23 06/29/2019  . Td 12/15/2008  . Tdap 01/25/2019  . Zoster Recombinat (Shingrix) 11/03/2018, 01/25/2019    TDAP status: Up to date Flu Vaccine status: Completed at today's visit Pneumococcal vaccine status: Up to date Covid-19 vaccine status: Completed vaccines  Qualifies for Shingles Vaccine? Yes   Zostavax completed Yes   Shingrix Completed?: Yes  Screening Tests Health Maintenance  Topic Date Due  . PNA vac Low Risk Adult (2 of 2 - PCV13) 06/28/2020  . COVID-19 Vaccine (2 - Pfizer 2-dose series) 09/18/2020  . MAMMOGRAM  10/04/2021  . TETANUS/TDAP  01/24/2029  . COLONOSCOPY  03/12/2030  . INFLUENZA VACCINE  Completed  . DEXA SCAN  Completed  . Hepatitis C Screening  Completed    Health Maintenance  Health Maintenance Due  Topic Date Due  . PNA vac Low Risk Adult (2 of 2 - PCV13) 06/28/2020    Colorectal cancer screening: Completed 04/21. Repeat every 10 years Mammogram status: Completed 09/2019. Repeat every year Bone Density status: Completed 09/2019. Results reflect: Bone density results: OSTEOPENIA. Repeat every 2 years.  Lung Cancer Screening: (Low Dose CT Chest recommended if Age 49-80 years, 30 pack-year currently smoking OR have quit w/in 15years.) does not qualify.   Lung Cancer Screening Referral: N/A  Additional Screening:  Hepatitis C Screening: does not qualify; Completed   Vision Screening: Recommended annual ophthalmology exams for Jennelle Pinkstaff detection of glaucoma and other disorders of the eye. Is the patient up to date with their annual eye exam?  Yes  Who is the provider or what is the name of the office in which the patient attends  annual eye exams? Dr. Mendel Ryder with [facility] If pt is not established with a provider, would they like to be referred to a provider to establish care? Established.   Dental Screening: Recommended annual dental exams for proper  oral hygiene  Community Resource Referral / Chronic Care Management: CRR required this visit?  No   CCM required this visit?  No      Plan:     I have personally reviewed and noted the following in the patient's chart:   . Medical and social history . Use of alcohol, tobacco or illicit drugs  . Current medications and supplements . Functional ability and status . Nutritional status . Physical activity . Advanced directives . List of other physicians . Hospitalizations, surgeries, and ER visits in previous 12 months . Vitals . Screenings to include cognitive, depression, and falls . Referrals and appointments  In addition, I have reviewed and discussed with patient certain preventive protocols, quality metrics, and best practice recommendations. A written personalized care plan for preventive services as well as general preventive health recommendations were provided to patient.     Orma Render, NP   08/31/2020

## 2020-08-31 NOTE — Patient Instructions (Addendum)
We can plan to have your second pneumonia shot when you come in for you annual physical exam with Dr. Sheppard Coil.   Bone Density Test-- Due 09/2021 The bone density test uses a special type of X-ray to measure the amount of calcium and other minerals in your bones. It can measure bone density in the hip and the spine. The test procedure is similar to having a regular X-ray. This test may also be called:  Bone densitometry.  Bone mineral density test.  Dual-energy X-ray absorptiometry (DEXA). You may have this test to:  Diagnose a condition that causes weak or thin bones (osteoporosis).  Screen you for osteoporosis.  Predict your risk for a broken bone (fracture).  Determine how well your osteoporosis treatment is working. Tell a health care provider about:  Any allergies you have.  All medicines you are taking, including vitamins, herbs, eye drops, creams, and over-the-counter medicines.  Any problems you or family members have had with anesthetic medicines.  Any blood disorders you have.  Any surgeries you have had.  Any medical conditions you have.  Whether you are pregnant or may be pregnant.  Any medical tests you have had within the past 14 days that used contrast material. What are the risks? Generally, this is a safe procedure. However, it does expose you to a small amount of radiation, which can slightly increase your cancer risk. What happens before the procedure?  Do not take any calcium supplements starting 24 hours before your test.  Remove all metal jewelry, eyeglasses, dental appliances, and any other metal objects. What happens during the procedure?   You will lie down on an exam table. There will be an X-ray generator below you and an imaging device above you.  Other devices, such as boxes or braces, may be used to position your body properly for the scan.  The machine will slowly scan your body. You will need to keep still.  The images will show up  on a screen in the room. Images will be examined by a specialist after your test is done. The procedure may vary among health care providers and hospitals. What happens after the procedure?  It is up to you to get your test results. Ask your health care provider, or the department that is doing the test, when your results will be ready. Summary  A bone density test is an imaging test that uses a type of X-ray to measure the amount of calcium and other minerals in your bones.  The test may be used to diagnose or screen you for a condition that causes weak or thin bones (osteoporosis), predict your risk for a broken bone (fracture), or determine how well your osteoporosis treatment is working.  Do not take any calcium supplements starting 24 hours before your test.  Ask your health care provider, or the department that is doing the test, when your results will be ready. This information is not intended to replace advice given to you by your health care provider. Make sure you discuss any questions you have with your health care provider. Document Revised: 11/26/2017 Document Reviewed: 09/14/2017 Elsevier Patient Education  [employer].   Colonoscopy, Adult- Due 09/2029 A colonoscopy is a procedure to look at the entire large intestine. This procedure is done using a long, thin, flexible tube that has a camera on the end. You may have a colonoscopy:  As a part of normal colorectal screening.  If you have certain symptoms, such as: ?  A low number of red blood cells in your blood (anemia). ? Diarrhea that does not go away. ? Pain in your abdomen. ? Blood in your stool. A colonoscopy can help screen for and diagnose medical problems, including:  Tumors.  Extra tissue that grows where mucus forms (polyps).  Inflammation.  Areas of bleeding. Tell your health care provider about:  Any allergies you have.  All medicines you are taking, including vitamins, herbs, eye drops,  creams, and over-the-counter medicines.  Any problems you or family members have had with anesthetic medicines.  Any blood disorders you have.  Any surgeries you have had.  Any medical conditions you have.  Any problems you have had with having bowel movements.  Whether you are pregnant or may be pregnant. What are the risks? Generally, this is a safe procedure. However, problems may occur, including:  Bleeding.  Damage to your intestine.  Allergic reactions to medicines given during the procedure.  Infection. This is rare. What happens before the procedure? Eating and drinking restrictions Follow instructions from your health care provider about eating or drinking restrictions, which may include:  A few days before the procedure: ? Follow a low-fiber diet. ? Avoid nuts, seeds, dried fruit, raw fruits, and vegetables.  1-3 days before the procedure: ? Eat only gelatin dessert or ice pops. ? Drink only clear liquids, such as water, clear juice, clear broth or bouillon, black coffee or tea, or clear soft drinks or sports drinks. ? Avoid liquids that contain red or purple dye.  The day of the procedure: ? Do not eat solid foods. You may continue to drink clear liquids until up to 2 hours before the procedure. ? Do not eat or drink anything starting 2 hours before the procedure, or within the time period that your health care provider recommends. Bowel prep If you were prescribed a bowel prep to take by mouth (orally) to clean out your colon:  Take it as told by your health care provider. Starting the day before your procedure, you will need to drink a large amount of liquid medicine. The liquid will cause you to have many bowel movements of loose stool until your stool becomes almost clear or light green.  If your skin or the opening between the buttocks (anus) gets irritated from diarrhea, you may relieve the irritation using: ? Wipes with medicine in them, such as adult  wet wipes with aloe and vitamin E. ? A product to soothe skin, such as petroleum jelly.  If you vomit while drinking the bowel prep: ? Take a break for up to 60 minutes. ? Begin the bowel prep again. ? Call your health care provider if you keep vomiting or you cannot take the bowel prep without vomiting.  To clean out your colon, you may also be given: ? Laxative medicines. These help you have a bowel movement. ? Instructions for enema use. An enema is liquid medicine injected into your rectum. Medicines Ask your health care provider about:  Changing or stopping your regular medicines or supplements. This is especially important if you are taking iron supplements, diabetes medicines, or blood thinners.  Taking medicines such as aspirin and ibuprofen. These medicines can thin your blood. Do not take these medicines unless your health care provider tells you to take them.  Taking over-the-counter medicines, vitamins, herbs, and supplements. General instructions  Ask your health care provider what steps will be taken to help prevent infection. These may include washing skin with a germ-killing  soap.  Plan to have someone take you home from the hospital or clinic. What happens during the procedure?   An IV will be inserted into one of your veins.  You may be given one or more of the following: ? A medicine to help you relax (sedative). ? A medicine to numb the area (local anesthetic). ? A medicine to make you fall asleep (general anesthetic). This is rarely needed.  You will lie on your side with your knees bent.  The tube will: ? Have oil or gel put on it (be lubricated). ? Be inserted into your anus. ? Be gently eased through all parts of your large intestine.  Air will be sent into your colon to keep it open. This may cause some pressure or cramping.  Images will be taken with the camera and will appear on a screen.  A small tissue sample may be removed to be looked at  under a microscope (biopsy). The tissue may be sent to a lab for testing if any signs of problems are found.  If small polyps are found, they may be removed and checked for cancer cells.  When the procedure is finished, the tube will be removed. The procedure may vary among health care providers and hospitals. What happens after the procedure?  Your blood pressure, heart rate, breathing rate, and blood oxygen level will be monitored until you leave the hospital or clinic.  You may have a small amount of blood in your stool.  You may pass gas and have mild cramping or bloating in your abdomen. This is caused by the air that was used to open your colon during the exam.  Do not drive for 24 hours after the procedure.  It is up to you to get the results of your procedure. Ask your health care provider, or the department that is doing the procedure, when your results will be ready. Summary  A colonoscopy is a procedure to look at the entire large intestine.  Follow instructions from your health care provider about eating and drinking before the procedure.  If you were prescribed an oral bowel prep to clean out your colon, take it as told by your health care provider.  During the colonoscopy, a flexible tube with a camera on its end is inserted into the anus and then passed into the other parts of the large intestine. This information is not intended to replace advice given to you by your health care provider. Make sure you discuss any questions you have with your health care provider. Document Revised: 06/03/2019 Document Reviewed: 06/03/2019 Elsevier Patient Education  [place] and Cholesterol Restricted Eating Plan Getting too much fat and cholesterol in your diet may cause health problems. Choosing the right foods helps keep your fat and cholesterol at normal levels. This can keep you from getting certain diseases. Your doctor may recommend an eating plan that  includes:  Total fat: ______% or less of total calories a day.  Saturated fat: ______% or less of total calories a day.  Cholesterol: less than _________mg a day.  Fiber: ______g a day. What are tips for following this plan? Meal planning  At meals, divide your plate into four equal parts: ? Fill one-half of your plate with vegetables and green salads. ? Fill one-fourth of your plate with whole grains. ? Fill one-fourth of your plate with low-fat (lean) protein foods.  Eat fish that is high in omega-3 fats at least two  times a week. This includes mackerel, tuna, sardines, and salmon.  Eat foods that are high in fiber, such as whole grains, beans, apples, broccoli, carrots, peas, and barley. General tips   Work with your doctor to lose weight if you need to.  Avoid: ? Foods with added sugar. ? Fried foods. ? Foods with partially hydrogenated oils.  Limit alcohol intake to no more than 1 drink a day for nonpregnant women and 2 drinks a day for men. One drink equals 12 oz of beer, 5 oz of wine, or 1 oz of hard liquor. Reading food labels  Check food labels for: ? Trans fats. ? Partially hydrogenated oils. ? Saturated fat (g) in each serving. ? Cholesterol (mg) in each serving. ? Fiber (g) in each serving.  Choose foods with healthy fats, such as: ? Monounsaturated fats. ? Polyunsaturated fats. ? Omega-3 fats.  Choose grain products that have whole grains. Look for the word "whole" as the first word in the ingredient list. Cooking  Cook foods using low-fat methods. These include baking, boiling, grilling, and broiling.  Eat more home-cooked foods. Eat at restaurants and buffets less often.  Avoid cooking using saturated fats, such as butter, cream, palm oil, palm kernel oil, and coconut oil. Recommended foods  Fruits  All fresh, canned (in natural juice), or frozen fruits. Vegetables  Fresh or frozen vegetables (raw, steamed, roasted, or grilled). Green  salads. Grains  Whole grains, such as whole wheat or whole grain breads, crackers, cereals, and pasta. Unsweetened oatmeal, bulgur, barley, quinoa, or brown rice. Corn or whole wheat flour tortillas. Meats and other protein foods  Ground beef (85% or leaner), grass-fed beef, or beef trimmed of fat. Skinless chicken or [place]. Ground chicken or [place]. Pork trimmed of fat. All fish and seafood. Egg whites. Dried beans, peas, or lentils. Unsalted nuts or seeds. Unsalted canned beans. Nut butters without added sugar or oil. Dairy  Low-fat or nonfat dairy products, such as skim or 1% milk, 2% or reduced-fat cheeses, low-fat and fat-free ricotta or cottage cheese, or plain low-fat and nonfat yogurt. Fats and oils  Tub margarine without trans fats. Light or reduced-fat mayonnaise and salad dressings. Avocado. Olive, canola, sesame, or safflower oils. The items listed above may not be a complete list of foods and beverages you can eat. Contact a dietitian for more information. Foods to avoid Fruits  Canned fruit in heavy syrup. Fruit in cream or butter sauce. Fried fruit. Vegetables  Vegetables cooked in cheese, cream, or butter sauce. Fried vegetables. Grains  White bread. White pasta. White rice. Cornbread. Bagels, pastries, and croissants. Crackers and snack foods that contain trans fat and hydrogenated oils. Meats and other protein foods  Fatty cuts of meat. Ribs, chicken wings, bacon, sausage, bologna, salami, chitterlings, fatback, hot dogs, bratwurst, and packaged lunch meats. Liver and organ meats. Whole eggs and egg yolks. Chicken and [place] with skin. Fried meat. Dairy  Whole or 2% milk, cream, half-and-half, and cream cheese. Whole milk cheeses. Whole-fat or sweetened yogurt. Full-fat cheeses. Nondairy creamers and whipped toppings. Processed cheese, cheese spreads, and cheese curds. Beverages  Alcohol. Sugar-sweetened drinks such as sodas, lemonade, and fruit drinks. Fats and  oils  Butter, stick margarine, lard, shortening, ghee, or bacon fat. Coconut, palm kernel, and palm oils. Sweets and desserts  Corn syrup, sugars, honey, and molasses. Candy. Jam and jelly. Syrup. Sweetened cereals. Cookies, pies, cakes, donuts, muffins, and ice cream. The items listed above may not be a complete list of  foods and beverages you should avoid. Contact a dietitian for more information. Summary  Choosing the right foods helps keep your fat and cholesterol at normal levels. This can keep you from getting certain diseases.  At meals, fill one-half of your plate with vegetables and green salads.  Eat high-fiber foods, like whole grains, beans, apples, carrots, peas, and barley.  Limit added sugar, saturated fats, alcohol, and fried foods. This information is not intended to replace advice given to you by your health care provider. Make sure you discuss any questions you have with your health care provider. Document Revised: 07/14/2018 Document Reviewed: 07/28/2017 Elsevier Patient Education  [place] Prevention in the Home, Adult Falls can cause injuries. They can happen to people of all ages. There are many things you can do to make your home safe and to help prevent falls. Ask for help when making these changes, if needed. What actions can I take to prevent falls? General Instructions  Use good lighting in all rooms. Replace any light bulbs that burn out.  Turn on the lights when you go into a dark area. Use night-lights.  Keep items that you use often in easy-to-reach places. Lower the shelves around your home if necessary.  Set up your furniture so you have a clear path. Avoid moving your furniture around.  Do not have throw rugs and other things on the floor that can make you trip.  Avoid walking on wet floors.  If any of your floors are uneven, fix them.  Add color or contrast paint or tape to clearly mark and help you see: ? Any grab bars or  handrails. ? First and last steps of stairways. ? Where the edge of each step is.  If you use a stepladder: ? Make sure that it is fully opened. Do not climb a closed stepladder. ? Make sure that both sides of the stepladder are locked into place. ? Ask someone to hold the stepladder for you while you use it.  If there are any pets around you, be aware of where they are. What can I do in the bathroom?      Keep the floor dry. Clean up any water that spills onto the floor as soon as it happens.  Remove soap buildup in the tub or shower regularly.  Use non-skid mats or decals on the floor of the tub or shower.  Attach bath mats securely with double-sided, non-slip rug tape.  If you need to sit down in the shower, use a plastic, non-slip stool.  Install grab bars by the toilet and in the tub and shower. Do not use towel bars as grab bars. What can I do in the bedroom?  Make sure that you have a light by your bed that is easy to reach.  Do not use any sheets or blankets that are too big for your bed. They should not hang down onto the floor.  Have a firm chair that has side arms. You can use this for support while you get dressed. What can I do in the kitchen?  Clean up any spills right away.  If you need to reach something above you, use a strong step stool that has a grab bar.  Keep electrical cords out of the way.  Do not use floor polish or wax that makes floors slippery. If you must use wax, use non-skid floor wax. What can I do with my stairs?  Do not leave any  items on the stairs.  Make sure that you have a light switch at the top of the stairs and the bottom of the stairs. If you do not have them, ask someone to add them for you.  Make sure that there are handrails on both sides of the stairs, and use them. Fix handrails that are broken or loose. Make sure that handrails are as long as the stairways.  Install non-slip stair treads on all stairs in your  home.  Avoid having throw rugs at the top or bottom of the stairs. If you do have throw rugs, attach them to the floor with carpet tape.  Choose a carpet that does not hide the edge of the steps on the stairway.  Check any carpeting to make sure that it is firmly attached to the stairs. Fix any carpet that is loose or worn. What can I do on the outside of my home?  Use bright outdoor lighting.  Regularly fix the edges of walkways and driveways and fix any cracks.  Remove anything that might make you trip as you walk through a door, such as a raised step or threshold.  Trim any bushes or trees on the path to your home.  Regularly check to see if handrails are loose or broken. Make sure that both sides of any steps have handrails.  Install guardrails along the edges of any raised decks and porches.  Clear walking paths of anything that might make someone trip, such as tools or rocks.  Have any leaves, snow, or ice cleared regularly.  Use sand or salt on walking paths during winter.  Clean up any spills in your garage right away. This includes grease or oil spills. What other actions can I take?  Wear shoes that: ? Have a low heel. Do not wear high heels. ? Have rubber bottoms. ? Are comfortable and fit you well. ? Are closed at the toe. Do not wear open-toe sandals.  Use tools that help you move around (mobility aids) if they are needed. These include: ? Canes. ? Walkers. ? Scooters. ? Crutches.  Review your medicines with your doctor. Some medicines can make you feel dizzy. This can increase your chance of falling. Ask your doctor what other things you can do to help prevent falls. Where to find more information  Centers for Disease Control and Prevention, [facility]: https://garcia.biz/  [employer] on Aging: BrainJudge.co.uk Contact a doctor if:  You are afraid of falling at home.  You feel weak, drowsy, or dizzy at home.  You fall at  home. Summary  There are many simple things that you can do to make your home safe and to help prevent falls.  Ways to make your home safe include removing tripping hazards and installing grab bars in the bathroom.  Ask for help when making these changes in your home. This information is not intended to replace advice given to you by your health care provider. Make sure you discuss any questions you have with your health care provider. Document Revised: 03/03/2019 Document Reviewed: 06/25/2017 Elsevier Patient Education  2020 [place] Maintenance, Female Adopting a healthy lifestyle and getting preventive care are important in promoting health and wellness. Ask your health care provider about:  The right schedule for you to have regular tests and exams.  Things you can do on your own to prevent diseases and keep yourself healthy. What should I know about diet, weight, and exercise? Eat a healthy diet  Eat a diet that includes plenty of vegetables, fruits, low-fat dairy products, and lean protein.  Do not eat a lot of foods that are high in solid fats, added sugars, or sodium. Maintain a healthy weight Body mass index (BMI) is used to identify weight problems. It estimates body fat based on height and weight. Your health care provider can help determine your BMI and help you achieve or maintain a healthy weight. Get regular exercise Get regular exercise. This is one of the most important things you can do for your health. Most adults should:  Exercise for at least 150 minutes each week. The exercise should increase your heart rate and make you sweat (moderate-intensity exercise).  Do strengthening exercises at least twice a week. This is in addition to the moderate-intensity exercise.  Spend less time sitting. Even light physical activity can be beneficial. Watch cholesterol and blood lipids Have your blood tested for lipids and cholesterol at 66 years of age, then  have this test every 5 years. Have your cholesterol levels checked more often if:  Your lipid or cholesterol levels are high.  You are older than 66 years of age.  You are at high risk for heart disease. What should I know about cancer screening? Depending on your health history and family history, you may need to have cancer screening at various ages. This may include screening for:  Breast cancer.  Cervical cancer.  Colorectal cancer.  Skin cancer.  Lung cancer. What should I know about heart disease, diabetes, and high blood pressure? Blood pressure and heart disease  High blood pressure causes heart disease and increases the risk of stroke. This is more likely to develop in people who have high blood pressure readings, are of African descent, or are overweight.  Have your blood pressure checked: ? Every 3-5 years if you are 43-44 years of age. ? Every year if you are 74 years old or older. Diabetes Have regular diabetes screenings. This checks your fasting blood sugar level. Have the screening done:  Once every three years after age 67 if you are at a normal weight and have a low risk for diabetes.  More often and at a younger age if you are overweight or have a high risk for diabetes. What should I know about preventing infection? Hepatitis B If you have a higher risk for hepatitis B, you should be screened for this virus. Talk with your health care provider to find out if you are at risk for hepatitis B infection. Hepatitis C Testing is recommended for:  Everyone born from [birth year] through 1967.  Anyone with known risk factors for hepatitis C. Sexually transmitted infections (STIs)  Get screened for STIs, including gonorrhea and chlamydia, if: ? You are sexually active and are younger than 66 years of age. ? You are older than 66 years of age and your health care provider tells you that you are at risk for this type of infection. ? Your sexual activity has changed  since you were last screened, and you are at increased risk for chlamydia or gonorrhea. Ask your health care provider if you are at risk.  Ask your health care provider about whether you are at high risk for HIV. Your health care provider may recommend a prescription medicine to help prevent HIV infection. If you choose to take medicine to prevent HIV, you should first get tested for HIV. You should then be tested every 3 months for as long as you are taking the  medicine. Pregnancy  If you are about to stop having your period (premenopausal) and you may become pregnant, seek counseling before you get pregnant.  Take 400 to 800 micrograms (mcg) of folic acid every day if you become pregnant.  Ask for birth control (contraception) if you want to prevent pregnancy. Osteoporosis and menopause Osteoporosis is a disease in which the bones lose minerals and strength with aging. This can result in bone fractures. If you are 49 years old or older, or if you are at risk for osteoporosis and fractures, ask your health care provider if you should:  Be screened for bone loss.  Take a calcium or vitamin D supplement to lower your risk of fractures.  Be given hormone replacement therapy (HRT) to treat symptoms of menopause. Follow these instructions at home: Lifestyle  Do not use any products that contain nicotine or tobacco, such as cigarettes, e-cigarettes, and chewing tobacco. If you need help quitting, ask your health care provider.  Do not use street drugs.  Do not share needles.  Ask your health care provider for help if you need support or information about quitting drugs. Alcohol use  Do not drink alcohol if: ? Your health care provider tells you not to drink. ? You are pregnant, may be pregnant, or are planning to become pregnant.  If you drink alcohol: ? Limit how much you use to 0-1 drink a day. ? Limit intake if you are breastfeeding.  Be aware of how much alcohol is in your drink.  In the [place] one drink equals one 12 oz bottle of beer (355 mL), one 5 oz glass of wine (148 mL), or one 1 oz glass of hard liquor (44 mL). General instructions  Schedule regular health, dental, and eye exams.  Stay current with your vaccines.  Tell your health care provider if: ? You often feel depressed. ? You have ever been abused or do not feel safe at home. Summary  Adopting a healthy lifestyle and getting preventive care are important in promoting health and wellness.  Follow your health care provider's instructions about healthy diet, exercising, and getting tested or screened for diseases.  Follow your health care provider's instructions on monitoring your cholesterol and blood pressure. This information is not intended to replace advice given to you by your health care provider. Make sure you discuss any questions you have with your health care provider. Document Revised: 11/03/2018 Document Reviewed: 11/03/2018 Elsevier Patient Education  2020 [employer].

## 2020-09-03 NOTE — Addendum Note (Signed)
Addended by: Narda Rutherford on: 09/03/2020 07:30 AM   Modules accepted: Orders

## 2020-09-07 ENCOUNTER — Other Ambulatory Visit: Payer: Self-pay | Admitting: Nurse Practitioner

## 2020-09-07 DIAGNOSIS — Z0000 Encounter for general adult medical examination without abnormal findings: Secondary | ICD-10-CM | POA: Diagnosis not present

## 2020-09-11 ENCOUNTER — Other Ambulatory Visit: Payer: Self-pay | Admitting: Nurse Practitioner

## 2020-09-11 NOTE — Progress Notes (Signed)
Overall labs look good!   Cholesterol, kidney function, liver function, electrolytes, and hemoglobin are great!  Your blood sugar is slightly elevated- were you fasting when you had these labs drawn? If not, we will try to add a hemoglobin A1c to the labs to check your average blood sugars.   White blood cells are just slightly decreased, which is consistent where you have been for years.  No concerns today.

## 2020-09-13 LAB — COMPLETE METABOLIC PANEL WITH GFR
AG Ratio: 1.9 (calc) (ref 1.0–2.5)
ALT: 25 U/L (ref 6–29)
AST: 21 U/L (ref 10–35)
Albumin: 4.5 g/dL (ref 3.6–5.1)
Alkaline phosphatase (APISO): 40 U/L (ref 37–153)
BUN: 9 mg/dL (ref 7–25)
CO2: 29 mmol/L (ref 20–32)
Calcium: 9.8 mg/dL (ref 8.6–10.4)
Chloride: 105 mmol/L (ref 98–110)
Creat: 0.58 mg/dL (ref 0.50–0.99)
GFR, Est African American: 111 mL/min/{1.73_m2} (ref >=60)
GFR, Est Non African American: 96 mL/min/{1.73_m2} (ref >=60)
Globulin: 2.4 g/dL (calc) (ref 1.9–3.7)
Glucose, Bld: 101 mg/dL — ABNORMAL HIGH (ref 65–99)
Potassium: 4.2 mmol/L (ref 3.5–5.3)
Sodium: 141 mmol/L (ref 135–146)
Total Bilirubin: 0.7 mg/dL (ref 0.2–1.2)
Total Protein: 6.9 g/dL (ref 6.1–8.1)

## 2020-09-13 LAB — CBC WITH DIFFERENTIAL/PLATELET
Absolute Monocytes: 157 cells/uL — ABNORMAL LOW (ref 200–950)
Basophils Absolute: 11 cells/uL (ref 0–200)
Basophils Relative: 0.4 %
Eosinophils Absolute: 39 cells/uL (ref 15–500)
Eosinophils Relative: 1.4 %
HCT: 43.1 % (ref 35.0–45.0)
Hemoglobin: 14.3 g/dL (ref 11.7–15.5)
Lymphs Abs: 1803 cells/uL (ref 850–3900)
MCH: 29.7 pg (ref 27.0–33.0)
MCHC: 33.2 g/dL (ref 32.0–36.0)
MCV: 89.4 fL (ref 80.0–100.0)
MPV: 10.9 fL (ref 7.5–12.5)
Monocytes Relative: 5.6 %
Neutro Abs: 790 cells/uL — ABNORMAL LOW (ref 1500–7800)
Neutrophils Relative %: 28.2 %
Platelets: 232 10*3/uL (ref 140–400)
RBC: 4.82 10*6/uL (ref 3.80–5.10)
RDW: 12.1 % (ref 11.0–15.0)
Total Lymphocyte: 64.4 %
WBC: 2.8 10*3/uL — ABNORMAL LOW (ref 3.8–10.8)

## 2020-09-13 LAB — LIPID PANEL
Cholesterol: 176 mg/dL (ref <200)
HDL: 73 mg/dL (ref >=50)
LDL Cholesterol (Calc): 89 mg/dL (calc)
Non-HDL Cholesterol (Calc): 103 mg/dL (calc) (ref <130)
Total CHOL/HDL Ratio: 2.4 (calc) (ref <5.0)
Triglycerides: 49 mg/dL (ref <150)

## 2020-09-13 LAB — HEMOGLOBIN A1C W/OUT EAG: Hgb A1c MFr Bld: 5.2 % of total Hgb (ref <5.7)

## 2020-09-15 NOTE — Progress Notes (Signed)
Mariah Everett,   Your hemoglobin A1c came back beautiful at 5.2%, which means to signs of diabetes or pre-diabetes. Keep up the great work!  Mariah Everett

## 2020-09-24 ENCOUNTER — Other Ambulatory Visit (HOSPITAL_BASED_OUTPATIENT_CLINIC_OR_DEPARTMENT_OTHER): Payer: Self-pay | Admitting: Gastroenterology

## 2020-09-24 DIAGNOSIS — K219 Gastro-esophageal reflux disease without esophagitis: Secondary | ICD-10-CM | POA: Diagnosis not present

## 2020-09-24 DIAGNOSIS — K648 Other hemorrhoids: Secondary | ICD-10-CM | POA: Diagnosis not present

## 2020-09-24 MED FILL — OSCIMIN 0.125 MG SUBL: 0.125 | 8 days supply | Qty: 32 | Fill #0

## 2020-10-03 ENCOUNTER — Encounter: Payer: Self-pay | Admitting: Osteopathic Medicine

## 2020-10-03 ENCOUNTER — Other Ambulatory Visit: Payer: Self-pay | Admitting: Osteopathic Medicine

## 2020-10-03 ENCOUNTER — Ambulatory Visit (INDEPENDENT_AMBULATORY_CARE_PROVIDER_SITE_OTHER): Payer: 59 | Admitting: Osteopathic Medicine

## 2020-10-03 VITALS — BP 123/86 | HR 80 | Ht 65.0 in | Wt 127.0 lb

## 2020-10-03 DIAGNOSIS — Z0000 Encounter for general adult medical examination without abnormal findings: Secondary | ICD-10-CM | POA: Diagnosis not present

## 2020-10-03 DIAGNOSIS — M24551 Contracture, right hip: Secondary | ICD-10-CM | POA: Diagnosis not present

## 2020-10-03 DIAGNOSIS — N952 Postmenopausal atrophic vaginitis: Secondary | ICD-10-CM

## 2020-10-03 DIAGNOSIS — D708 Other neutropenia: Secondary | ICD-10-CM | POA: Diagnosis not present

## 2020-10-03 MED ORDER — ESTRADIOL 0.1 MG/GM VA CREA: TOPICAL_CREAM | VAGINAL | 5 refills | Status: DC

## 2020-10-03 MED ORDER — MELOXICAM 7.5 MG PO TABS: 7.5000 mg | ORAL_TABLET | Freq: Every day | ORAL | 3 refills | Status: DC | PRN

## 2020-10-03 MED FILL — MELOXICAM 7.5 MG TABLET: 7.5 | 30 days supply | Qty: 60 | Fill #0

## 2020-10-03 MED FILL — ESTRADIOL 0.1 MG/GM CREA: 0.1 | 30 days supply | Qty: 43 | Fill #0

## 2020-10-03 NOTE — Progress Notes (Signed)
HPI: Mariah Everett is a 64 y.o. female who  has a past medical history of CERUMEN IMPACTION, BILATERAL (09/03/2009), CERVICAL RADICULOPATHY (12/15/2008), Chronic benign neutropenia ([place]), Esophageal dysmotility, Loss of weight (08/12/2016), Neck pain, Preventative health care (11/03/2013), Rectal bleeding (07/11/2011), Sciatica of left side (11/10/2014), Shoulder pain, and Thyroid nodule.  she presents to [facility] today, 10/03/20,  for chief complaint of:  Annual physical Vaginal pain after menopause R hip pain   Medicare AWV already completed 08/31/2020 Mammogram due this year or next 09/2021 at the latest  Consider PCV13 vax  Concern for vaginal pain and dyspareunia ongoing >1 year.   R hip pain iliac crest worse w/ hip flexion. No weakness, no lower back pain. (+)reproduction of pain w/ resisted hip flexion, no trochanteric tenderness, normal passive ROM        ASSESSMENT/PLAN: The primary encounter diagnosis was Annual physical exam. Diagnoses of Right hip flexor tightness, Other neutropenia ([place]), and Postmenopausal atrophic vaginitis were also pertinent to this visit.  No orders of the defined types were placed in this encounter.   1. Annual physical exam See below for preventive care   2. Right hip flexor tightness R hip flexor strain/tightness, pt declined PT referral, will trial home exercises and trial NSAID  3. Other neutropenia ([place]) Trend down, will recheck   4. Postmenopausal atrophic vaginitis Trial estrace    Meds ordered this encounter  Medications  . estradiol (ESTRACE VAGINAL) 0.1 MG/GM vaginal cream    Sig: Place 1 Applicatorful vaginally at bedtime for 7 days, THEN 1 Applicatorful 3 (three) times a week.    Dispense:  127.5 g    Refill:  5  . meloxicam (MOBIC) 7.5 MG tablet    Sig: Take 1-2 tablets (7.5-15 mg total) by mouth daily as needed for pain.    Dispense:  60 tablet    Refill:  3    Patient  Instructions  General Preventive Care  Most recent routine screening labs: reviewed today.   Blood pressure goal 140/90 or less.   Tobacco: don't!   Alcohol: responsible moderation is ok for most adults - if you have concerns about your alcohol intake, please talk to me!   Exercise: as tolerated to reduce risk of cardiovascular disease and diabetes. Strength training will also prevent osteoporosis.   Mental health: if need for mental health care (medicines, counseling, other), or concerns about moods, please let me know!   Sexual / Reproductive health: if need for STD testing, or if concerns with libido/pain problems, please let me know!   Advanced Directive: Living Will and/or Healthcare Power of Attorney recommended for all adults, regardless of age or health.  Vaccines  Flu vaccine: for almost everyone, every fall.   Shingles vaccine: after age 86.   Pneumonia vaccines: after age 79.  Tetanus booster: every 10 years   COVID vaccine: THANKS for getting your vaccine! :)  Cancer screenings   Colon cancer screening: for everyone age 71-75. Due 2031  Breast cancer screening: mammogram every other year or annually after age 21.   Cervical cancer screening: stop Pap at age 46 if last 2 were normal, stop w/ hysterectomy.   Lung cancer screening: not needed for non-smokers  Infection screenings  . HIV: recommended screening at least once age 70-65, more often as needed. . Gonorrhea/Chlamydia: screening as needed . Hepatitis C: recommended once for everyone age 66-75 . TB: certain at-risk populations, or depending on work requirements and/or travel history Other .  Bone Density Test: recommended for women at age 70 every other year     Follow-up plan: Return in about 6 weeks (around 11/14/2020) for PELVIC EXAM / PAP, FOLLOW UP VAGINAL PAIN AND HIP PAIN  .                                                 ################################################# ################################################# ################################################# #################################################    No outpatient medications have been marked as taking for the 10/03/20 encounter (Office Visit) with Emeterio Reeve, DO.    No Known Allergies     Review of Systems: Pertinent (+) and (-) ROS in HPI as above   Exam:  BP 123/86   Pulse 80   Ht 5\' 5"  (1.651 m)   Wt 127 lb (57.6 kg)   BMI 21.13 kg/m   Constitutional: VS see above. General Appearance: alert, well-developed, well-nourished, NAD  Neck: No masses, trachea midline.   Respiratory: Normal respiratory effort. no wheeze, no rhonchi, no rales  Cardiovascular: S1/S2 normal, no murmur, no rub/gallop auscultated. RRR.   Musculoskeletal: Gait normal. Symmetric and independent movement of all extremities  Abdominal: non-tender, non-distended, no appreciable organomegaly, neg Murphy's, BS WNLx4  Neurological: Normal balance/coordination. No tremor.  Skin: warm, dry, intact.   Psychiatric: Normal judgment/insight. Normal mood and affect. Oriented x3.       Visit summary with medication list and pertinent instructions was printed for patient to review, patient was advised to alert [place] if any updates are needed. All questions at time of visit were answered - patient instructed to contact office with any additional concerns. ER/RTC precautions were reviewed with the patient and understanding verbalized.   Please note: voice recognition software was used to produce this document, and typos may escape review. Please contact Dr. Sheppard Coil for any needed clarifications.    Follow up plan: Return in about 6 weeks (around 11/14/2020) for PELVIC EXAM / PAP, FOLLOW UP VAGINAL PAIN AND HIP PAIN .

## 2020-10-03 NOTE — Patient Instructions (Signed)
General Preventive Care  Most recent routine screening labs: reviewed today.   Blood pressure goal 140/90 or less.   Tobacco: don't!   Alcohol: responsible moderation is ok for most adults - if you have concerns about your alcohol intake, please talk to me!   Exercise: as tolerated to reduce risk of cardiovascular disease and diabetes. Strength training will also prevent osteoporosis.   Mental health: if need for mental health care (medicines, counseling, other), or concerns about moods, please let me know!   Sexual / Reproductive health: if need for STD testing, or if concerns with libido/pain problems, please let me know!   Advanced Directive: Living Will and/or Healthcare Power of Attorney recommended for all adults, regardless of age or health.  Vaccines  Flu vaccine: for almost everyone, every fall.   Shingles vaccine: after age 57.   Pneumonia vaccines: after age 63.  Tetanus booster: every 10 years   COVID vaccine: THANKS for getting your vaccine! :)  Cancer screenings   Colon cancer screening: for everyone age 59-75. Due 2031  Breast cancer screening: mammogram every other year or annually after age 25.   Cervical cancer screening: stop Pap at age 35 if last 2 were normal, stop w/ hysterectomy.   Lung cancer screening: not needed for non-smokers  Infection screenings  . HIV: recommended screening at least once age 63-65, more often as needed. . Gonorrhea/Chlamydia: screening as needed . Hepatitis C: recommended once for everyone age 12-75 . TB: certain at-risk populations, or depending on work requirements and/or travel history Other . Bone Density Test: recommended for women at age 2 every other year

## 2020-10-16 DIAGNOSIS — Z1231 Encounter for screening mammogram for malignant neoplasm of breast: Secondary | ICD-10-CM | POA: Diagnosis not present

## 2020-10-16 LAB — HM MAMMOGRAPHY

## 2020-11-01 NOTE — Telephone Encounter (Signed)
This encounter was created in error - please disregard.

## 2020-11-14 ENCOUNTER — Ambulatory Visit (INDEPENDENT_AMBULATORY_CARE_PROVIDER_SITE_OTHER): Payer: 59 | Admitting: Osteopathic Medicine

## 2020-11-14 ENCOUNTER — Other Ambulatory Visit (HOSPITAL_COMMUNITY)
Admission: RE | Admit: 2020-11-14 | Discharge: 2020-11-14 | Disposition: A | Discharge status: Home / Self Care | Payer: 59 | Source: Ambulatory Visit | Attending: Osteopathic Medicine | Admitting: Osteopathic Medicine

## 2020-11-14 ENCOUNTER — Encounter: Payer: Self-pay | Admitting: Osteopathic Medicine

## 2020-11-14 VITALS — BP 124/88 | HR 86 | Temp 98.5°F | Wt 132.1 lb

## 2020-11-14 DIAGNOSIS — Z124 Encounter for screening for malignant neoplasm of cervix: Secondary | ICD-10-CM | POA: Diagnosis not present

## 2020-11-14 DIAGNOSIS — N952 Postmenopausal atrophic vaginitis: Secondary | ICD-10-CM | POA: Diagnosis not present

## 2020-11-14 DIAGNOSIS — M24551 Contracture, right hip: Secondary | ICD-10-CM

## 2020-11-14 NOTE — Progress Notes (Signed)
Mariah Everett is a 64 y.o. female who presents to  [facility]  today, 11/14/20, seeking care for the following:  . Atrophic vaginitis - we started Estrace 10/03/20 and pt reports some improvement but still has pain w/ intercourse localized to posterior introitus. Hx traumatic childbirth, retained placenta later requiring emergency surgery. On exam, there is some well-healed scar tissue at posterior introitus that is not as elastic as surrounding tissue. GYn otherwise normal for postmenopausals  . Due for Pap - pt reports normal pap last check, she is 64  . R hip pain persistent, advised f/u sports med     ASSESSMENT & PLAN with other pertinent findings:  The primary encounter diagnosis was Cervical cancer screening. Diagnoses of Postmenopausal atrophic vaginitis and Right hip flexor tightness were also pertinent to this visit.   1. Cervical cancer screening Pap pbtained today  Should not need addidional paps if this one normal   2. Postmenopausal atrophic vaginitis cotinue estrace can give this some more time If still dyspareunia will refer to GYN   3. Right hip flexor tightness See sports med!     Follow-up instructions: Return in about 1 year (around 11/14/2021) for [place] (call week prior to visit for lab orders).                                         BP 124/88   Pulse 86   Temp 98.5 F (36.9 C)   Wt 132 lb 1.6 oz (59.9 kg)   SpO2 99%   BMI 21.98 kg/m   Current Meds  Medication Sig  . CALCIUM-MAGNESIUM-ZINC PO Take 1 tablet by mouth.  . dicyclomine (BENTYL) 10 MG capsule dicyclomine 10 mg capsule  . estradiol (ESTRACE VAGINAL) 0.1 MG/GM vaginal cream Place 1 Applicatorful vaginally at bedtime for 7 days, THEN 1 Applicatorful 3 (three) times a week.  . famotidine (PEPCID) 20 MG tablet Take 20 mg by mouth 2 (two) times daily.  Marland Kitchen gabapentin (NEURONTIN) 300 MG capsule  gabapentin 300 mg capsule  . meloxicam (MOBIC) 7.5 MG tablet Take 1-2 tablets (7.5-15 mg total) by mouth daily as needed for pain.  . Methen-Hyosc-Meth Blue-Na Phos (UROGESIC-BLUE) 81.6 MG TABS Urogesic-Blue 81.6 mg-40.8 mg-0.12 mg tablet  . Multiple Vitamin (MULTIVITAMIN) tablet Take 1 tablet by mouth daily.  Marland Kitchen omeprazole (PRILOSEC) 20 MG capsule Take 1 capsule (20 mg total) by mouth daily.  . TURMERIC PO Take 1,000 mg by mouth daily.    No results found for this or any previous visit (from the past 72 hour(s)).  No results found.     All questions at time of visit were answered - patient instructed to contact office with any additional concerns or updates.  ER/RTC precautions were reviewed with the patient as applicable.   Please note: voice recognition software was used to produce this document, and typos may escape review. Please contact Dr. Sheppard Coil for any needed clarifications.

## 2020-11-20 LAB — CYTOLOGY - PAP
Adequacy: ABSENT
Comment: NEGATIVE
Diagnosis: NEGATIVE
High risk HPV: NEGATIVE

## 2021-01-28 ENCOUNTER — Emergency Department (HOSPITAL_BASED_OUTPATIENT_CLINIC_OR_DEPARTMENT_OTHER): Payer: 59

## 2021-01-28 ENCOUNTER — Encounter (HOSPITAL_BASED_OUTPATIENT_CLINIC_OR_DEPARTMENT_OTHER): Payer: Self-pay | Admitting: *Deleted

## 2021-01-28 ENCOUNTER — Emergency Department (HOSPITAL_BASED_OUTPATIENT_CLINIC_OR_DEPARTMENT_OTHER)
Admission: EM | Admit: 2021-01-28 | Discharge: 2021-01-29 | Disposition: A | Discharge status: Home / Self Care | Payer: 59 | Attending: Emergency Medicine | Admitting: Emergency Medicine

## 2021-01-28 ENCOUNTER — Other Ambulatory Visit: Payer: Self-pay

## 2021-01-28 DIAGNOSIS — R110 Nausea: Secondary | ICD-10-CM | POA: Insufficient documentation

## 2021-01-28 DIAGNOSIS — R61 Generalized hyperhidrosis: Secondary | ICD-10-CM | POA: Diagnosis not present

## 2021-01-28 DIAGNOSIS — R5383 Other fatigue: Secondary | ICD-10-CM | POA: Diagnosis not present

## 2021-01-28 DIAGNOSIS — R0789 Other chest pain: Secondary | ICD-10-CM | POA: Insufficient documentation

## 2021-01-28 DIAGNOSIS — R079 Chest pain, unspecified: Secondary | ICD-10-CM | POA: Diagnosis not present

## 2021-01-28 LAB — BASIC METABOLIC PANEL
Anion gap: 10 (ref 5–15)
BUN: 8 mg/dL (ref 8–23)
CO2: 24 mmol/L (ref 22–32)
Calcium: 9 mg/dL (ref 8.9–10.3)
Chloride: 104 mmol/L (ref 98–111)
Creatinine, Ser: 0.6 mg/dL (ref 0.44–1.00)
GFR, Estimated: >60 mL/min (ref >60)
Glucose, Bld: 105 mg/dL — ABNORMAL HIGH (ref 70–99)
Potassium: 3.3 mmol/L — ABNORMAL LOW (ref 3.5–5.1)
Sodium: 138 mmol/L (ref 135–145)

## 2021-01-28 LAB — HEPATIC FUNCTION PANEL
ALT: 18 U/L (ref 0–44)
AST: 18 U/L (ref 15–41)
Albumin: 4.4 g/dL (ref 3.5–5.0)
Alkaline Phosphatase: 31 U/L — ABNORMAL LOW (ref 38–126)
Bilirubin, Direct: 0.2 mg/dL (ref 0.0–0.2)
Indirect Bilirubin: 0.9 mg/dL (ref 0.3–0.9)
Total Bilirubin: 1.1 mg/dL (ref 0.3–1.2)
Total Protein: 7.1 g/dL (ref 6.5–8.1)

## 2021-01-28 LAB — CBC
HCT: 42.5 % (ref 36.0–46.0)
Hemoglobin: 14.4 g/dL (ref 12.0–15.0)
MCH: 30.1 pg (ref 26.0–34.0)
MCHC: 33.9 g/dL (ref 30.0–36.0)
MCV: 88.7 fL (ref 80.0–100.0)
Platelets: 223 10*3/uL (ref 150–400)
RBC: 4.79 MIL/uL (ref 3.87–5.11)
RDW: 11.8 % (ref 11.5–15.5)
WBC: 4.3 10*3/uL (ref 4.0–10.5)
nRBC: 0 % (ref 0.0–0.2)

## 2021-01-28 LAB — LIPASE, BLOOD: Lipase: 45 U/L (ref 11–51)

## 2021-01-28 LAB — TROPONIN I (HIGH SENSITIVITY): Troponin I (High Sensitivity): <2 ng/L (ref <18)

## 2021-01-28 MED ORDER — ALUM & MAG HYDROXIDE-SIMETH 200-200-20 MG/5ML PO SUSP
30.0000 mL | Freq: Once | ORAL | Status: AC
  Administered 2021-01-28: 30 mL via ORAL
  Filled 2021-01-28: qty 30

## 2021-01-28 MED ORDER — PANTOPRAZOLE SODIUM 40 MG IV SOLR
40.0000 mg | Freq: Once | INTRAVENOUS | Status: AC
  Administered 2021-01-28: 40 mg via INTRAVENOUS
  Filled 2021-01-28: qty 40

## 2021-01-28 MED ORDER — ASPIRIN 81 MG PO CHEW
324.0000 mg | CHEWABLE_TABLET | Freq: Once | ORAL | Status: AC
  Administered 2021-01-28: 324 mg via ORAL
  Filled 2021-01-28: qty 4

## 2021-01-28 MED ORDER — LIDOCAINE VISCOUS HCL 2 % MT SOLN
15.0000 mL | Freq: Once | OROMUCOSAL | Status: AC
  Administered 2021-01-28: 15 mL via ORAL
  Filled 2021-01-28: qty 15

## 2021-01-28 MED ORDER — ONDANSETRON HCL 4 MG/2ML IJ SOLN
4.0000 mg | Freq: Once | INTRAMUSCULAR | Status: AC
  Administered 2021-01-28: 4 mg via INTRAVENOUS
  Filled 2021-01-28: qty 2

## 2021-01-28 NOTE — ED Triage Notes (Signed)
Epigastric pain x 2 days unrelieved by antiacids. Left arm pain since yesterday. Today she has been nauseated. Diaphoretic today. Fatigue.

## 2021-01-28 NOTE — ED Provider Notes (Signed)
[facility] Provider Note   CSN: [id number] Arrival date & time: 01/28/21  2127     History Chief Complaint  Patient presents with  . Chest Pain    Mariah Everett is a 65 y.o. female.  The history is provided by the patient and medical records.  Chest Pain  Mariah Everett is a 65 y.o. female who presents to the Emergency Department complaining of chest discomfort. She presents the emergency department complaining of chest tightness and nausea. Symptoms started 1 to 2 days ago with predominant nausea and grumbling feeling in her epigastric area. She has been experiencing associated intermittent left-sided chest tightness as well as aching in her left arm. Episodes come and go. She has fatigue as well as episodes of diaphoresis. She denies any fevers, difficulty breathing, abdominal pain, vomiting, diarrhea, constipation, dysuria, leg swelling or pain. She has no medical problems and takes no medications. She does have a history of esophageal disutility in the past. She did take Pepto at home with no significant improvement in symptoms. She does not smoke, use alcohol or use drugs. No family history of cardiac disease.    Past Medical History:  Diagnosis Date  . CERUMEN IMPACTION, BILATERAL 09/03/2009   Qualifier: Diagnosis of  By: Royal Piedra NP, Tammy    . CERVICAL RADICULOPATHY 12/15/2008   - MRI 12/22/2008 Pos C7 disc protrusion > Discectomy and fusion 05/18/2009 [facility] [place]     . Chronic benign neutropenia ([facility])   . Esophageal dysmotility   . Loss of weight 08/12/2016  . Neck pain    radicular C7/8 right. MRI 12-22-18 pos C7 disc protrusion  . Preventative health care 11/03/2013   Followed as Primary Care Patient/ [facility]/ [facility]    . Rectal bleeding 07/11/2011   Negative colonoscopy 09/12/11  Dr Genelle Gather   . Sciatica of left side 11/10/2014   Followed as Primary Care Patient/ [facility]/ [facility] > referred  Back to [facility]    .  Shoulder pain   . Thyroid nodule    a. Bx/TFTs normal by prior workup, clinically benign.    Patient Active Problem List   Diagnosis Date Noted  . History of hemorrhoidectomy 03/30/2020  . Carpal tunnel syndrome of right wrist 02/29/2020  . History of fusion of cervical spine 01/31/2020  . Cervical radiculopathy 01/31/2020  . Peripheral nerve disease 01/31/2020  . Melanocytic neoplasm of skin 12/07/2019  . Nail abnormality 12/07/2019  . Lumbosacral radiculitis 01/04/2019  . Gastroesophageal reflux disease without esophagitis 07/12/2018  . Globus pharyngeus 07/12/2018  . Somatic complaints, multiple 08/14/2016  . Headache 08/12/2016  . Upper airway cough syndrome 11/10/2014  . Chest pain 05/19/2014  . Hyperlipidemia 11/03/2013  . Abdominal pain 12/12/2012  . Premature ventricular contraction 07/11/2011  . Other neutropenia ([place]) 07/31/2010  . THYROID NODULE 05/04/2009    Past Surgical History:  Procedure Laterality Date  . BIOPSY THYROID    . CERVICAL DISCECTOMY  05-18-2009  . CERVICAL FUSION  05-18-2009  . HEMORROIDECTOMY     1990  . LEFT HEART CATHETERIZATION WITH CORONARY ANGIOGRAM N/A 05/19/2014   Procedure: LEFT HEART CATHETERIZATION WITH CORONARY ANGIOGRAM;  Surgeon: Jolaine Artist, MD;  Location: [facility] CATH LAB;  Service: Cardiovascular;  Laterality: N/A;     OB History   No obstetric history on file.     Family History  Problem Relation Age of Onset  . Ovarian cancer Mother   . Lung cancer Mother   .  Cervical cancer Mother   . High blood pressure Mother   . Stroke Mother   . Throat cancer Father   . Lupus Sister   . High blood pressure Sister   . Diabetes Son   . High blood pressure Other     Social History   Tobacco Use  . Smoking status: Never Smoker  . Smokeless tobacco: Never Used  Vaping Use  . Vaping Use: Never used  Substance Use Topics  . Alcohol use: Not Currently    Comment: occ  . Drug use: No    Home Medications Prior to  Admission medications   Medication Sig Start Date End Date Taking? Authorizing Provider  CALCIUM-MAGNESIUM-ZINC PO Take 1 tablet by mouth.   Yes [provider]  famotidine (PEPCID) 20 MG tablet Take 20 mg by mouth 2 (two) times daily.   Yes [provider]  Multiple Vitamin (MULTIVITAMIN) tablet Take 1 tablet by mouth daily.   Yes [provider]  omeprazole (PRILOSEC) 20 MG capsule Take 1 capsule (20 mg total) by mouth daily. 10/03/19  Yes Emeterio Reeve, DO  dicyclomine (BENTYL) 10 MG capsule dicyclomine 10 mg capsule    [provider]  gabapentin (NEURONTIN) 300 MG capsule gabapentin 300 mg capsule    [provider]  meloxicam (MOBIC) 7.5 MG tablet Take 1-2 tablets (7.5-15 mg total) by mouth daily as needed for pain. 10/03/20   Emeterio Reeve, DO  Methen-Hyosc-Meth Blue-Na Phos (UROGESIC-BLUE) 81.6 MG TABS Urogesic-Blue 81.6 mg-40.8 mg-0.12 mg tablet    [provider]  TURMERIC PO Take 1,000 mg by mouth daily.    [provider]    Allergies    Patient has no known allergies.  Review of Systems   Review of Systems  Cardiovascular: Positive for chest pain.  All other systems reviewed and are negative.   Physical Exam Updated Vital Signs BP (!) 137/91   Pulse 79   Temp 98.3 F (36.8 C) (Oral)   Resp 13   Ht 5\' 5"  (1.651 m)   Wt 59.9 kg   SpO2 98%   BMI 21.98 kg/m   Physical Exam Vitals and nursing note reviewed.  Constitutional:      Appearance: She is well-developed and well-nourished.  HENT:     Head: Normocephalic and atraumatic.  Cardiovascular:     Rate and Rhythm: Normal rate and regular rhythm.     Heart sounds: No murmur heard.   Pulmonary:     Effort: Pulmonary effort is normal. No respiratory distress.     Breath sounds: Normal breath sounds.  Abdominal:     Palpations: Abdomen is soft.     Tenderness: There is no abdominal tenderness. There is no guarding or rebound.   Musculoskeletal:        General: No swelling, tenderness or edema.     Comments: 2+ radial and DP pulses bilaterally  Skin:    General: Skin is warm and dry.  Neurological:     Mental Status: She is alert and oriented to person, place, and time.  Psychiatric:        Mood and Affect: Mood and affect normal.        Behavior: Behavior normal.     ED Results / Procedures / Treatments   Labs (all labs ordered are listed, but only abnormal results are displayed) Labs Reviewed  BASIC METABOLIC PANEL - Abnormal; Notable for the following components:      Result Value   Potassium 3.3 (*)  Glucose, Bld 105 (*)    All other components within normal limits  HEPATIC FUNCTION PANEL - Abnormal; Notable for the following components:   Alkaline Phosphatase 31 (*)    All other components within normal limits  CBC  LIPASE, BLOOD  TROPONIN I (HIGH SENSITIVITY)  TROPONIN I (HIGH SENSITIVITY)    EKG EKG Interpretation  Date/Time:  Monday January 28 2021 21:37:18 EST Ventricular Rate:  95 PR Interval:    QRS Duration: 74 QT Interval:  354 QTC Calculation: 445 R Axis:   71 Text Interpretation: Sinus rhythm Probable anteroseptal infarct, old Confirmed by Quintella Reichert [phone]) on 01/28/2021 9:43:54 PM   Radiology DG Chest 2 View  Result Date: 01/28/2021 CLINICAL DATA:  Chest pain EXAM: CHEST - 2 VIEW COMPARISON:  Radiograph 03/31/2018 FINDINGS: Chronic hyperinflation. No consolidation, features of edema, pneumothorax, or effusion. The aorta is calcified. The remaining cardiomediastinal contours are unremarkable. Telemetry leads overlie the chest. No acute osseous or soft tissue abnormality. Prior cervical fusion, incompletely assessed on this exam. IMPRESSION: Chronic hyperinflation. No acute cardiopulmonary abnormality. Aortic Atherosclerosis (ICD10-I70.0). Electronically Signed   By: Lovena Le M.D.   On: 01/28/2021 22:08    Procedures Procedures   Medications Ordered in  ED Medications  pantoprazole (PROTONIX) injection 40 mg (has no administration in time range)  alum & mag hydroxide-simeth (MAALOX/MYLANTA) 200-200-20 MG/5ML suspension 30 mL (30 mLs Oral Given 01/28/21 2248)    And  lidocaine (XYLOCAINE) 2 % viscous mouth solution 15 mL (15 mLs Oral Given 01/28/21 2248)  ondansetron (ZOFRAN) injection 4 mg (4 mg Intravenous Given 01/28/21 2243)  aspirin chewable tablet 324 mg (324 mg Oral Given 01/28/21 2246)    ED Course  I have reviewed the triage vital signs and the nursing notes.  Pertinent labs & imaging results that were available during my care of the patient were reviewed by me and considered in my medical decision making (see chart for details).    MDM Rules/Calculators/A&P                         Pt here for evaluation of two days of nausea, intermittent chest tightness.  She has no significant pmhx.  EKG without acute ischemic changes and initial troponin is negative.  Presentation is not c/w dissection, PE, SBO, pancreatitis.  Sxs partially improved after GI cocktail.  Pt care transferred pending repeat troponin.    Final Clinical Impression(s) / ED Diagnoses Final diagnoses:  None    Rx / DC Orders ED Discharge Orders    None       Quintella Reichert, MD 01/28/21 2344

## 2021-01-28 NOTE — ED Notes (Signed)
Reports arm and shoulder pain really bad for 2 days. The pain started approx. A week ago.  Pt. Reports nausea also.  Feeling tightness in her chest.    EDP at bedside of Pt. At present time.

## 2021-01-29 DIAGNOSIS — R110 Nausea: Secondary | ICD-10-CM | POA: Diagnosis not present

## 2021-01-29 DIAGNOSIS — R5383 Other fatigue: Secondary | ICD-10-CM | POA: Diagnosis not present

## 2021-01-29 DIAGNOSIS — R61 Generalized hyperhidrosis: Secondary | ICD-10-CM | POA: Diagnosis not present

## 2021-01-29 DIAGNOSIS — R0789 Other chest pain: Secondary | ICD-10-CM | POA: Diagnosis not present

## 2021-01-29 LAB — TROPONIN I (HIGH SENSITIVITY): Troponin I (High Sensitivity): <2 ng/L (ref <18)

## 2021-01-29 MED ORDER — POTASSIUM CHLORIDE CRYS ER 20 MEQ PO TBCR
40.0000 meq | EXTENDED_RELEASE_TABLET | Freq: Once | ORAL | Status: AC
  Administered 2021-01-29: 40 meq via ORAL
  Filled 2021-01-29: qty 2

## 2021-01-31 ENCOUNTER — Encounter: Payer: Self-pay | Admitting: Osteopathic Medicine

## 2021-02-12 DIAGNOSIS — L821 Other seborrheic keratosis: Secondary | ICD-10-CM | POA: Diagnosis not present

## 2021-03-13 ENCOUNTER — Ambulatory Visit: Payer: 59 | Attending: Internal Medicine

## 2021-03-13 DIAGNOSIS — Z23 Encounter for immunization: Secondary | ICD-10-CM

## 2021-03-13 NOTE — Progress Notes (Signed)
   Covid-19 Vaccination Clinic  Name:  ODEAL WELDEN    MRN: [id number] DOB: [birth year]  03/13/2021  Ms. Delbridge was observed post Covid-19 immunization for 15 minutes without incident. She was provided with Vaccine Information Sheet and instruction to access the V-Safe system.   Ms. Robello was instructed to call 911 with any severe reactions post vaccine: Marland Kitchen Difficulty breathing  . Swelling of face and throat  . A fast heartbeat  . A bad rash all over body  . Dizziness and weakness   Immunizations Administered    Name Date Dose VIS Date Route   PFIZER Comrnaty(Gray TOP) Covid-19 Vaccine 03/13/2021 11:56 AM 0.3 mL 11/01/2020 Intramuscular   Manufacturer: [place]   Lot: [id number]   NDC: [phone]

## 2021-03-15 ENCOUNTER — Other Ambulatory Visit (HOSPITAL_BASED_OUTPATIENT_CLINIC_OR_DEPARTMENT_OTHER): Payer: Self-pay

## 2021-03-15 MED ORDER — PFIZER-BIONT COVID-19 VAC-TRIS 30 MCG/0.3ML IM SUSP
INTRAMUSCULAR | 0 refills | Status: DC
  Filled 2021-03-15: qty 0.3, 1d supply, fill #0

## 2021-03-20 DIAGNOSIS — M542 Cervicalgia: Secondary | ICD-10-CM | POA: Diagnosis not present

## 2021-03-20 DIAGNOSIS — M9903 Segmental and somatic dysfunction of lumbar region: Secondary | ICD-10-CM | POA: Diagnosis not present

## 2021-03-20 DIAGNOSIS — M5013 Cervical disc disorder with radiculopathy, cervicothoracic region: Secondary | ICD-10-CM | POA: Diagnosis not present

## 2021-03-20 DIAGNOSIS — M9901 Segmental and somatic dysfunction of cervical region: Secondary | ICD-10-CM | POA: Diagnosis not present

## 2021-04-24 DIAGNOSIS — M25512 Pain in left shoulder: Secondary | ICD-10-CM | POA: Diagnosis not present

## 2021-05-07 ENCOUNTER — Telehealth: Payer: Self-pay

## 2021-05-07 NOTE — Telephone Encounter (Signed)
Patient left a vm msg requesting an antibiotic rx. Per patient, she has tested recently for Covid. Results for testing was negative. Please contact the patient to schedule a virtual appointment. Thanks in advance.

## 2021-05-08 ENCOUNTER — Other Ambulatory Visit (HOSPITAL_BASED_OUTPATIENT_CLINIC_OR_DEPARTMENT_OTHER): Payer: Self-pay

## 2021-05-08 MED ORDER — ALBUTEROL SULFATE HFA 108 (90 BASE) MCG/ACT IN AERS
INHALATION_SPRAY | RESPIRATORY_TRACT | 0 refills | Status: DC
  Filled 2021-05-08: qty 18, 17d supply, fill #0

## 2021-05-08 MED ORDER — BENZONATATE 200 MG PO CAPS
200.0000 mg | ORAL_CAPSULE | Freq: Three times a day (TID) | ORAL | 0 refills | Status: DC | PRN
Start: 2021-05-08 — End: 2021-06-13
  Filled 2021-05-08: qty 30, 10d supply, fill #0

## 2021-05-08 NOTE — Telephone Encounter (Signed)
Left voicemail for patient to call us back.

## 2021-05-13 DIAGNOSIS — Z20822 Contact with and (suspected) exposure to covid-19: Secondary | ICD-10-CM | POA: Diagnosis not present

## 2021-05-14 DIAGNOSIS — H524 Presbyopia: Secondary | ICD-10-CM | POA: Diagnosis not present

## 2021-05-23 DIAGNOSIS — M25512 Pain in left shoulder: Secondary | ICD-10-CM | POA: Diagnosis not present

## 2021-06-03 ENCOUNTER — Other Ambulatory Visit (HOSPITAL_BASED_OUTPATIENT_CLINIC_OR_DEPARTMENT_OTHER): Payer: Self-pay

## 2021-06-03 DIAGNOSIS — M19012 Primary osteoarthritis, left shoulder: Secondary | ICD-10-CM | POA: Diagnosis not present

## 2021-06-03 DIAGNOSIS — M7542 Impingement syndrome of left shoulder: Secondary | ICD-10-CM | POA: Diagnosis not present

## 2021-06-03 MED ORDER — PREDNISONE 10 MG PO TABS
ORAL_TABLET | ORAL | 0 refills | Status: DC
  Filled 2021-06-03: qty 42, 12d supply, fill #0

## 2021-06-06 ENCOUNTER — Ambulatory Visit: Payer: 59 | Attending: Specialist

## 2021-06-06 ENCOUNTER — Other Ambulatory Visit: Payer: Self-pay

## 2021-06-06 DIAGNOSIS — R293 Abnormal posture: Secondary | ICD-10-CM

## 2021-06-06 DIAGNOSIS — M25612 Stiffness of left shoulder, not elsewhere classified: Secondary | ICD-10-CM | POA: Diagnosis not present

## 2021-06-06 DIAGNOSIS — M25512 Pain in left shoulder: Secondary | ICD-10-CM | POA: Diagnosis not present

## 2021-06-06 DIAGNOSIS — M6281 Muscle weakness (generalized): Secondary | ICD-10-CM | POA: Diagnosis not present

## 2021-06-06 NOTE — Therapy (Addendum)
[facility] [address]  [place], [place], [address] Phone: [phone]   Fax:  [phone]  Physical Therapy Evaluation  Patient Details  Name: Mariah Everett MRN: [id number] Date of Birth: June 27, 1956 Referring Provider (PT): Sydnee Cabal, MD   Encounter Date: 06/06/2021   PT End of Session - 06/06/21 1423     Visit Number 1    Number of Visits 16    Date for PT Re-Evaluation 08/01/21    Authorization Type [employer]    PT Start Time 2671    PT Stop Time 1403    PT Time Calculation (min) 45 min    Activity Tolerance Patient tolerated treatment well;Patient limited by pain    Behavior During Therapy [facility] for tasks assessed/performed             Past Medical History:  Diagnosis Date   CERUMEN IMPACTION, BILATERAL 09/03/2009   Qualifier: Diagnosis of  By: Royal Piedra NP, Tammy     CERVICAL RADICULOPATHY 12/15/2008   - MRI 12/22/2008 Pos C7 disc protrusion > Discectomy and fusion 05/18/2009 [facility] in [place]      Chronic benign neutropenia ([place])    Esophageal dysmotility    Loss of weight 08/12/2016   Neck pain    radicular C7/8 right. MRI 12-22-18 pos C7 disc protrusion   Preventative health care 11/03/2013   Followed as Primary Care Patient/ [facility]/ [facility]     Rectal bleeding 07/11/2011   Negative colonoscopy 09/12/11  Dr Genelle Gather    Sciatica of left side 11/10/2014   Followed as Primary Care Patient/ [facility]/ [facility] > referred  Back to [facility]     Shoulder pain    Thyroid nodule    a. Bx/TFTs normal by prior workup, clinically benign.    Past Surgical History:  Procedure Laterality Date   BIOPSY THYROID     CERVICAL DISCECTOMY  05-18-2009   CERVICAL FUSION  05-18-2009   HEMORROIDECTOMY     1990   LEFT HEART CATHETERIZATION WITH CORONARY ANGIOGRAM N/A 05/19/2014   Procedure: LEFT HEART CATHETERIZATION WITH CORONARY ANGIOGRAM;  Surgeon: Jolaine Artist, MD;  Location: [facility]  CATH LAB;  Service: Cardiovascular;  Laterality: N/A;    There were no vitals filed for this visit.    Subjective Assessment - 06/06/21 1320     Subjective Pt reports her shoulder just started hurting and it got to a point where she couldn't reach OH and comb her hair. XR initially showed arthritis then MRI showed bone spur, inflammation, etc. She received 2 steroid shots on Monday in shoulder and is taking steroids. She can reach a little higher, but she still has a feeling in the front of the shoulder like it's "tearing" when she adducts her arm.    Pertinent History ACDF    Limitations Lifting;House hold activities    Diagnostic tests MRI: arthritis, bone spur, slight tear    Patient Stated Goals to improve shoulder mobility and decrease pain    Currently in Pain? Yes    Pain Score 0-No pain   8/10   Pain Location Shoulder    Pain Orientation Left;Anterior    Pain Descriptors / Indicators Aching    Pain Type Acute pain    Pain Onset More than a month ago    Pain Frequency Intermittent    Aggravating Factors  zipping up a dress, don/doff bra and jacket, doffing shirt, combing and washing hair, cannot sleep on  L side, modifying to not use L UE    Pain Relieving Factors had steroid injection, taking steroids, aneca cream    Effect of Pain on Daily Activities affects ADLs                [facility] PT Assessment - 06/06/21 0001       Assessment   Medical Diagnosis M25.519 (ICD-10-CM) - Shoulder pain    Referring Provider (PT) Sydnee Cabal, MD    Onset Date/Surgical Date 01/16/21    Hand Dominance Right    Next MD Visit 07/08/21    Prior Therapy for anterior discectomy 2010      Precautions   Precautions None      Restrictions   Weight Bearing Restrictions No      Balance Screen   Has the patient fallen in the past 6 months No    Has the patient had a decrease in activity level because of a fear of falling?  No    Is the patient reluctant to leave their home because of a  fear of falling?  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    Leisure flower garden      Observation/Other Assessments   Focus on Therapeutic Outcomes (FOTO)  58% ability, 68% predicted      Posture/Postural Control   Posture Comments mild foward shoulder rounding, mild forward head      ROM / Strength   AROM / PROM / Strength AROM;PROM;Strength      AROM   AROM Assessment Site Shoulder    Right/Left Shoulder Right;Left    Right Shoulder Flexion 145 Degrees    Right Shoulder ABduction 167 Degrees    Right Shoulder Internal Rotation --   T10   Right Shoulder External Rotation --   T3   Left Shoulder Flexion 95 Degrees    Left Shoulder ABduction 100 Degrees   elevated shoulder to complete motion   Left Shoulder Internal Rotation --   L3   Left Shoulder External Rotation --   occiput     PROM   Overall PROM Comments pain and empty end feels, significant guarding with PROM    PROM Assessment Site Shoulder    Right/Left Shoulder Left    Left Shoulder Flexion 100 Degrees    Left Shoulder ABduction 64 Degrees    Left Shoulder Internal Rotation 45 Degrees    Left Shoulder External Rotation 30 Degrees      Strength   Overall Strength Comments pain with L Flex/ABD    Strength Assessment Site Shoulder    Right/Left Shoulder Right;Left    Right Shoulder Flexion 5/5    Right Shoulder ABduction 5/5    Right Shoulder Internal Rotation 5/5    Right Shoulder External Rotation 4+/5    Left Shoulder Flexion 4+/5    Left Shoulder ABduction 4+/5    Left  Shoulder Internal Rotation 4+/5    Left Shoulder External Rotation  4/5      Palpation   Palpation comment TTP to L UT, proximal biceps, pec insertion, general anterior shoulder      Special Tests    Special Tests Rotator Cuff Impingement    Rotator Cuff Impingment tests Neer impingement test;Hawkins- Kennedy test;Empty Can test;Full Can test;Painful Arc of Motion      Neer Impingement test    Findings Positive    Side Left      Hawkins-Kennedy test   Findings Positive    Side Left      Empty Can test   Findings Negative    Side Left      Full Can test   Findings Negative    Side Left      Painful Arc of Motion   Findings Positive    Side Left                        Objective measurements completed on examination: See above findings.       [facility] Adult PT Treatment/Exercise - 06/06/21 0001       Self-Care   Self-Care Heat/Ice Application;Other Self-Care Comments    Heat/Ice Application advised pt on heat if needed to loosen up before exercises and ice after    Other Self-Care Comments  see pt edu      Exercises   Exercises Shoulder      Shoulder Exercises: Supine   Flexion AAROM;Left;10 reps    Flexion Limitations reciprocal inhibition      Shoulder Exercises: Seated   Retraction Both;10 reps   5"     Shoulder Exercises: Stretch   Corner Stretch 3 reps;20 seconds    Corner Stretch Limitations pec minor at doorway    Other Shoulder Stretches 60 pec stretch at doorway 3x20"                    PT Education - 06/06/21 1437     Education Details Educated on heat/ice application, diagnosis, prognosis, HEP, and POC    Person(s) Educated Patient    Methods Explanation;Demonstration;Tactile cues;Verbal cues;Handout    Comprehension Verbalized understanding;Returned demonstration;Verbal cues required;Tactile cues required              PT Short Term Goals - 06/06/21 1431       PT SHORT TERM GOAL #1   Title Pt will be I and  compliant with initial HEP.    Baseline provided at eval    Time 3    Period Weeks    Status New    Target Date 06/27/21      PT SHORT TERM GOAL #2   Title Pt will improve L shoulder PROM abduction to >/= 90.    Baseline 64 with guarding    Time 3    Period Weeks    Status New    Target Date 06/27/21               PT Long Term Goals - 06/06/21 1432       PT LONG TERM GOAL #1   Title Pt will be independent with advanced HEP for maintenance and continued progress post physical therapy.    Time 8    Period Weeks    Status New    Target Date 08/01/21      PT LONG TERM GOAL #2   Title Pt will demonstrate L shoulder AROM WFL and </= 3/10 pain, as needed to complete ADLs.  Baseline see flowsheet    Time 8    Period Weeks    Status New    Target Date 08/01/21      PT LONG TERM GOAL #3   Title Pt will be able to comb and wash hair without difficulty and </= 3/10 pain.    Baseline see flowsheet    Time 8    Period Weeks    Status New    Target Date 08/01/21      PT LONG TERM GOAL #4   Title Pt will increase FOTO ability to at least 68% ability, in order to demonstrate meaningful change in perceived level of functional ability.    Baseline 58% ability    Time 8    Period Weeks    Status New    Target Date 08/01/21      PT LONG TERM GOAL #5   Title Pt will be able to reach overhead, in order to perform IADLs like putting dishes into cabinet.    Baseline does not use L UE currently    Time 8    Period Weeks    Status New    Target Date 08/01/21                    Plan - 06/06/21 1408     Clinical Impression Statement Ms. Botello is a 65 yo female who presents with L shoulder pain that began in late Februrary, insidious onset. Pt demonstrates impairments in L shoulder A/PROM, mild strength deficits, mild forward head and shoulder rounding, significant guarding and pain in shoulder. Pt was negative for empty/full can testing, but positive for Neer's,  Hawkin's Kennedy, and Painful arc, likely from inflammation and potentially from rotator cuff impingement. Pt does appear to be developing frozen shoulder, evident with PROM testing and pain with motion, demonstrating empy end feels with all PROM. Pt was educated on heat/ice application, diagnosis, prognosis, HEP, and POC verbalizing understanding and consent to tx. She would benefit from skilled PT 1-2x/week for 8 weeks to address impairments and restore mobility to perform I/ADLs.    Personal Factors and Comorbidities Comorbidity 2;Time since onset of injury/illness/exacerbation    Comorbidities Thyroid disease, nerve/muscle disease    Examination-Activity Limitations Bathing;Dressing;Sleep;Hygiene/Grooming;Reach Overhead;Lift    Examination-Participation Restrictions Laundry;Cleaning    Stability/Clinical Decision Making Stable/Uncomplicated    Clinical Decision Making Low    Rehab Potential Good    PT Frequency --   1-2x/week   PT Duration 8 weeks    PT Treatment/Interventions ADLs/Self Care Home Management;Cryotherapy;Electrical Stimulation;Iontophoresis 4mg /ml Dexamethasone;Moist Heat;Functional mobility training;Therapeutic activities;Therapeutic exercise;Neuromuscular re-education;Patient/family education;Manual techniques;Passive range of motion;Dry needling;Taping;Vasopneumatic Device;Spinal Manipulations;Joint Manipulations    PT Next Visit Plan Assess response to HEP/update PRN, assess thoracic mobility as needed, modalities and manual therapy as indicated    PT Home Exercise Plan [email or web address]    Consulted and Agree with Plan of Care Patient             Patient will benefit from skilled therapeutic intervention in order to improve the following deficits and impairments:  Pain, Postural dysfunction, Impaired UE functional use, Impaired flexibility, Increased fascial restricitons, Decreased strength, Improper body mechanics, Impaired perceived functional ability, Decreased range of  motion  Visit Diagnosis: Acute pain of left shoulder - Plan: PT plan of care cert/re-cert  Abnormal posture - Plan: PT plan of care cert/re-cert  Stiffness of left shoulder, not elsewhere classified - Plan: PT plan of care cert/re-cert  Muscle weakness (generalized) - Plan: PT plan  of care cert/re-cert     Problem List Patient Active Problem List   Diagnosis Date Noted   History of hemorrhoidectomy 03/30/2020   Carpal tunnel syndrome of right wrist 02/29/2020   History of fusion of cervical spine 01/31/2020   Cervical radiculopathy 01/31/2020   Peripheral nerve disease 01/31/2020   Melanocytic neoplasm of skin 12/07/2019   Nail abnormality 12/07/2019   Lumbosacral radiculitis 01/04/2019   Gastroesophageal reflux disease without esophagitis 07/12/2018   Globus pharyngeus 07/12/2018   Somatic complaints, multiple 08/14/2016   Headache 08/12/2016   Upper airway cough syndrome 11/10/2014   Chest pain 05/19/2014   Hyperlipidemia 11/03/2013   Abdominal pain 12/12/2012   Premature ventricular contraction 07/11/2011   Other neutropenia ([place]) 07/31/2010   THYROID NODULE 05/04/2009    Izell Oak, PT, DPT 06/06/2021, 3:32 PM  [facility] [address]  [place], [place], [address] Phone: [phone]   Fax:  [phone]  Name: LAKIYA COTTAM MRN: [id number] Date of Birth: Feb 18, 1956

## 2021-06-06 NOTE — Patient Instructions (Signed)
Access Code: [email or web address] URL: [email or web address] Date: 06/06/2021 Prepared by: Kathreen Cornfield  Exercises Supine Shoulder Flexion AAROM - 2 x daily - 7 x weekly - 1-2 sets - 10 reps Doorway Pec Stretch at 60 Degrees Abduction with Arm Straight - 3 x daily - 7 x weekly - 3 sets - 20-30 seconds hold Corner Pec Minor Stretch - 3 x daily - 7 x weekly - 3 sets - 10 reps - 20-30 seconds hold Seated Scapular Retraction - 2 x daily - 7 x weekly - 3 sets - 10 reps

## 2021-06-11 ENCOUNTER — Encounter: Payer: Self-pay | Admitting: Physical Therapy

## 2021-06-11 ENCOUNTER — Other Ambulatory Visit: Payer: Self-pay

## 2021-06-11 ENCOUNTER — Ambulatory Visit: Payer: 59 | Admitting: Physical Therapy

## 2021-06-11 DIAGNOSIS — M25512 Pain in left shoulder: Secondary | ICD-10-CM

## 2021-06-11 DIAGNOSIS — R293 Abnormal posture: Secondary | ICD-10-CM

## 2021-06-11 DIAGNOSIS — M25612 Stiffness of left shoulder, not elsewhere classified: Secondary | ICD-10-CM

## 2021-06-11 DIAGNOSIS — M6281 Muscle weakness (generalized): Secondary | ICD-10-CM

## 2021-06-11 NOTE — Therapy (Signed)
[facility] [address]  [place], [place], [address] Phone: [phone]   Fax:  [phone]  Physical Therapy Treatment  Patient Details  Name: Mariah Everett MRN: [id number] Date of Birth: 08/07/1956 Referring Provider (PT): Sydnee Cabal, MD   Encounter Date: 06/11/2021   PT End of Session - 06/11/21 1158     Visit Number 2    Number of Visits 16    Date for PT Re-Evaluation 08/01/21    Authorization Type [employer]    PT Start Time 1104    PT Stop Time 1145    PT Time Calculation (min) 41 min    Activity Tolerance Patient tolerated treatment well;Patient limited by pain    Behavior During Therapy [facility] for tasks assessed/performed             Past Medical History:  Diagnosis Date   CERUMEN IMPACTION, BILATERAL 09/03/2009   Qualifier: Diagnosis of  By: Royal Piedra NP, Tammy     CERVICAL RADICULOPATHY 12/15/2008   - MRI 12/22/2008 Pos C7 disc protrusion > Discectomy and fusion 05/18/2009 [facility] in [place]      Chronic benign neutropenia ([place])    Esophageal dysmotility    Loss of weight 08/12/2016   Neck pain    radicular C7/8 right. MRI 12-22-18 pos C7 disc protrusion   Preventative health care 11/03/2013   Followed as Primary Care Patient/ [facility]/ [facility]     Rectal bleeding 07/11/2011   Negative colonoscopy 09/12/11  Dr Genelle Gather    Sciatica of left side 11/10/2014   Followed as Primary Care Patient/ [facility]/ [facility] > referred  Back to [facility]     Shoulder pain    Thyroid nodule    a. Bx/TFTs normal by prior workup, clinically benign.    Past Surgical History:  Procedure Laterality Date   BIOPSY THYROID     CERVICAL DISCECTOMY  05-18-2009   CERVICAL FUSION  05-18-2009   HEMORROIDECTOMY     1990   LEFT HEART CATHETERIZATION WITH CORONARY ANGIOGRAM N/A 05/19/2014   Procedure: LEFT HEART CATHETERIZATION WITH CORONARY ANGIOGRAM;  Surgeon: Jolaine Artist, MD;  Location: [facility] CATH  LAB;  Service: Cardiovascular;  Laterality: N/A;    There were no vitals filed for this visit.   Subjective Assessment - 06/11/21 1109     Subjective Pt. reports finished prednisone dose pack on Sunday, felt a little strange and fatigued after.  Has MRI results on disk today.  Compliant with HEP and reports overhead a little bit better.    Currently in Pain? No/denies    Pain Location Shoulder    Pain Orientation Left                               [facility] Adult PT Treatment/Exercise - 06/11/21 0001       Shoulder Exercises: Seated   Other Seated Exercises --      Shoulder Exercises: Standing   Internal Rotation AAROM;Left;Other (comment)   3 reps   Theraband Level (Shoulder Internal Rotation) Other (comment)   with towel, stopped due to pain     Shoulder Exercises: Pulleys   Flexion 3 minutes    Flexion Limitations 15 sec hold at end range, VC throughout to relax LUE    Scaption 2 minutes      Shoulder Exercises: ROM/Strengthening   UBE (Upper Arm Bike) level 1 3 min f/3  min b    Other ROM/Strengthening Exercises --      Manual Therapy   Manual Therapy Joint mobilization;Passive ROM;Soft tissue mobilization    Manual therapy comments --   PA mobs to thoracic spine, noted hypomobility   Joint Mobilization GH joint all directions    Soft tissue mobilization STM and IASTM with stainless steel tools focusing on pectoralis    Passive ROM poor tolerance due to muscle gaurding, distraction and oscillations                      PT Short Term Goals - 06/06/21 1431       PT SHORT TERM GOAL #1   Title Pt will be I and compliant with initial HEP.    Baseline provided at eval    Time 3    Period Weeks    Status New    Target Date 06/27/21      PT SHORT TERM GOAL #2   Title Pt will improve L shoulder PROM abduction to >/= 90.    Baseline 64 with guarding    Time 3    Period Weeks    Status New    Target Date 06/27/21                PT Long Term Goals - 06/06/21 1432       PT LONG TERM GOAL #1   Title Pt will be independent with advanced HEP for maintenance and continued progress post physical therapy.    Time 8    Period Weeks    Status New    Target Date 08/01/21      PT LONG TERM GOAL #2   Title Pt will demonstrate L shoulder AROM WFL and </= 3/10 pain, as needed to complete ADLs.    Baseline see flowsheet    Time 8    Period Weeks    Status New    Target Date 08/01/21      PT LONG TERM GOAL #3   Title Pt will be able to comb and wash hair without difficulty and </= 3/10 pain.    Baseline see flowsheet    Time 8    Period Weeks    Status New    Target Date 08/01/21      PT LONG TERM GOAL #4   Title Pt will increase FOTO ability to at least 68% ability, in order to demonstrate meaningful change in perceived level of functional ability.    Baseline 58% ability    Time 8    Period Weeks    Status New    Target Date 08/01/21      PT LONG TERM GOAL #5   Title Pt will be able to reach overhead, in order to perform IADLs like putting dishes into cabinet.    Baseline does not use L UE currently    Time 8    Period Weeks    Status New    Target Date 08/01/21                   Plan - 06/11/21 1122     Clinical Impression Statement Pt. is making good progress demonstrating improving L shoulder ROM and pain, able to flex L shoulder to 110 deg in pulleys however still extremely gaurded especially with PROM, VC needed throughout to not perform exercises/stretch only to tolerance to avoid excessive irritation of shoulder musculature, to continue current HEP although given information on purchase of  pulleys if desired.  Discussed DN today, patient not ready at this time but willing to consider based on progress.  Declined CP today but will apply at home.    Personal Factors and Comorbidities Comorbidity 2;Time since onset of injury/illness/exacerbation    Comorbidities Thyroid disease, nerve/muscle  disease    Examination-Activity Limitations Bathing;Dressing;Sleep;Hygiene/Grooming;Reach Overhead;Lift    Examination-Participation Restrictions Laundry;Cleaning    Stability/Clinical Decision Making Stable/Uncomplicated    Rehab Potential Good    PT Frequency --   1-2x/week   PT Duration 8 weeks    PT Treatment/Interventions ADLs/Self Care Home Management;Cryotherapy;Electrical Stimulation;Iontophoresis 4mg /ml Dexamethasone;Moist Heat;Functional mobility training;Therapeutic activities;Therapeutic exercise;Neuromuscular re-education;Patient/family education;Manual techniques;Passive range of motion;Dry needling;Taping;Vasopneumatic Device;Spinal Manipulations;Joint Manipulations    PT Next Visit Plan Assess response to HEP/update PRN, address thoracic hypomobility, modalities and manual therapy as indicated    PT Home Exercise Plan [email or web address]    Consulted and Agree with Plan of Care Patient             Patient will benefit from skilled therapeutic intervention in order to improve the following deficits and impairments:  Pain, Postural dysfunction, Impaired UE functional use, Impaired flexibility, Increased fascial restricitons, Decreased strength, Improper body mechanics, Impaired perceived functional ability, Decreased range of motion  Visit Diagnosis: Acute pain of left shoulder  Abnormal posture  Stiffness of left shoulder, not elsewhere classified  Muscle weakness (generalized)     Problem List Patient Active Problem List   Diagnosis Date Noted   History of hemorrhoidectomy 03/30/2020   Carpal tunnel syndrome of right wrist 02/29/2020   History of fusion of cervical spine 01/31/2020   Cervical radiculopathy 01/31/2020   Peripheral nerve disease 01/31/2020   Melanocytic neoplasm of skin 12/07/2019   Nail abnormality 12/07/2019   Lumbosacral radiculitis 01/04/2019   Gastroesophageal reflux disease without esophagitis 07/12/2018   Globus pharyngeus 07/12/2018   Somatic  complaints, multiple 08/14/2016   Headache 08/12/2016   Upper airway cough syndrome 11/10/2014   Chest pain 05/19/2014   Hyperlipidemia 11/03/2013   Abdominal pain 12/12/2012   Premature ventricular contraction 07/11/2011   Other neutropenia ([place]) 07/31/2010   THYROID NODULE 05/04/2009    Rennie Natter PT, DPT 06/11/2021, 12:04 PM  [facility] [address]  [place], [place], [address] Phone: [phone]   Fax:  [phone]  Name: KEYETTA HOLLINGWORTH MRN: [id number] Date of Birth: [birth year]

## 2021-06-13 ENCOUNTER — Other Ambulatory Visit (HOSPITAL_BASED_OUTPATIENT_CLINIC_OR_DEPARTMENT_OTHER): Payer: Self-pay

## 2021-06-13 ENCOUNTER — Ambulatory Visit: Payer: 59 | Admitting: Physical Therapy

## 2021-06-13 ENCOUNTER — Ambulatory Visit (INDEPENDENT_AMBULATORY_CARE_PROVIDER_SITE_OTHER): Payer: 59 | Admitting: Physician Assistant

## 2021-06-13 ENCOUNTER — Other Ambulatory Visit: Payer: Self-pay

## 2021-06-13 ENCOUNTER — Encounter: Payer: Self-pay | Admitting: Physical Therapy

## 2021-06-13 ENCOUNTER — Encounter: Payer: Self-pay | Admitting: Physician Assistant

## 2021-06-13 VITALS — BP 152/97 | HR 90 | Temp 99.0°F

## 2021-06-13 DIAGNOSIS — R293 Abnormal posture: Secondary | ICD-10-CM | POA: Diagnosis not present

## 2021-06-13 DIAGNOSIS — R110 Nausea: Secondary | ICD-10-CM | POA: Diagnosis not present

## 2021-06-13 DIAGNOSIS — M6281 Muscle weakness (generalized): Secondary | ICD-10-CM | POA: Diagnosis not present

## 2021-06-13 DIAGNOSIS — M25612 Stiffness of left shoulder, not elsewhere classified: Secondary | ICD-10-CM | POA: Diagnosis not present

## 2021-06-13 DIAGNOSIS — M25512 Pain in left shoulder: Secondary | ICD-10-CM | POA: Diagnosis not present

## 2021-06-13 DIAGNOSIS — N762 Acute vulvitis: Secondary | ICD-10-CM | POA: Diagnosis not present

## 2021-06-13 DIAGNOSIS — R1084 Generalized abdominal pain: Secondary | ICD-10-CM | POA: Diagnosis not present

## 2021-06-13 LAB — POCT URINALYSIS DIP (CLINITEK)
Bilirubin, UA: NEGATIVE
Blood, UA: NEGATIVE
Glucose, UA: NEGATIVE mg/dL
Ketones, POC UA: NEGATIVE mg/dL
Leukocytes, UA: NEGATIVE
Nitrite, UA: NEGATIVE
POC PROTEIN,UA: NEGATIVE
Spec Grav, UA: 1.02 (ref 1.010–1.025)
Urobilinogen, UA: 0.2 E.U./dL
pH, UA: 6.5 (ref 5.0–8.0)

## 2021-06-13 MED ORDER — VALACYCLOVIR HCL 500 MG PO TABS
500.0000 mg | ORAL_TABLET | Freq: Two times a day (BID) | ORAL | 0 refills | Status: AC
Start: 2021-06-13 — End: 2021-06-16
  Filled 2021-06-13: qty 6, 3d supply, fill #0

## 2021-06-13 MED ORDER — CEPHALEXIN 500 MG PO CAPS
500.0000 mg | ORAL_CAPSULE | Freq: Two times a day (BID) | ORAL | 0 refills | Status: AC
  Filled 2021-06-13: qty 14, 7d supply, fill #0

## 2021-06-13 MED ORDER — ONDANSETRON 4 MG PO TBDP
4.0000 mg | ORAL_TABLET | Freq: Three times a day (TID) | ORAL | 0 refills | Status: DC | PRN
  Filled 2021-06-13: qty 4, 2d supply, fill #0

## 2021-06-13 NOTE — Patient Instructions (Signed)

## 2021-06-13 NOTE — Patient Instructions (Signed)
Access Code: [email or web address] URL: [email or web address] Date: 06/13/2021 Prepared by: Glenetta Hew  Exercises Supine Shoulder Flexion AAROM - 2 x daily - 7 x weekly - 1-2 sets - 10 reps Doorway Pec Stretch at 60 Degrees Abduction with Arm Straight - 3 x daily - 7 x weekly - 3 sets - 20-30 seconds hold Corner Pec Minor Stretch - 3 x daily - 7 x weekly - 3 sets - 10 reps - 20-30 seconds hold Seated Scapular Retraction - 2 x daily - 7 x weekly - 3 sets - 10 reps Shoulder External Rotation with Anchored Resistance - 1 x daily - 7 x weekly - 2 sets - 10 reps* Shoulder Internal Rotation with Resistance - 1 x daily - 7 x weekly - 2 sets - 10 reps* Shoulder Extension with Resistance - 1 x daily - 7 x weekly - 2 sets - 10 reps Scapular Retraction with Resistance - 1 x daily - 7 x weekly - 2 sets - 10 reps Wall Push Up - 1 x daily - 7 x weekly - 2 sets - 10 reps Scaption Wall Slide with Towel - 1 x daily - 7 x weekly - 2 sets - 10 reps Sleeper Stretch - 1 x daily - 7 x weekly - 1 sets - 10 reps - 10 hold

## 2021-06-13 NOTE — Therapy (Signed)
[facility] [address]  [place], [place], [address] Phone: [phone]   Fax:  [phone]  Physical Therapy Treatment  Patient Details  Name: Mariah Everett MRN: [id number] Date of Birth: 1956-01-18 Referring Provider (PT): Sydnee Cabal, MD   Encounter Date: 06/13/2021   PT End of Session - 06/13/21 1157     Visit Number 3    Number of Visits 16    Date for PT Re-Evaluation 08/01/21    Authorization Type [employer]    PT Start Time 1101    PT Stop Time 1153    PT Time Calculation (min) 52 min    Activity Tolerance Patient tolerated treatment well;Patient limited by pain    Behavior During Therapy [facility] for tasks assessed/performed             Past Medical History:  Diagnosis Date   CERUMEN IMPACTION, BILATERAL 09/03/2009   Qualifier: Diagnosis of  By: Royal Piedra NP, Tammy     CERVICAL RADICULOPATHY 12/15/2008   - MRI 12/22/2008 Pos C7 disc protrusion > Discectomy and fusion 05/18/2009 [facility] [place]      Chronic benign neutropenia ([place])    Esophageal dysmotility    Loss of weight 08/12/2016   Neck pain    radicular C7/8 right. MRI 12-22-18 pos C7 disc protrusion   Preventative health care 11/03/2013   Followed as Primary Care Patient/ [facility]/ [facility]     Rectal bleeding 07/11/2011   Negative colonoscopy 09/12/11  Dr Genelle Gather    Sciatica of left side 11/10/2014   Followed as Primary Care Patient/ [facility]/ [facility] > referred  Back to [facility]     Shoulder pain    Thyroid nodule    a. Bx/TFTs normal by prior workup, clinically benign.    Past Surgical History:  Procedure Laterality Date   BIOPSY THYROID     CERVICAL DISCECTOMY  05-18-2009   CERVICAL FUSION  05-18-2009   HEMORROIDECTOMY     1990   LEFT HEART CATHETERIZATION WITH CORONARY ANGIOGRAM N/A 05/19/2014   Procedure: LEFT HEART CATHETERIZATION WITH CORONARY ANGIOGRAM;  Surgeon: Jolaine Artist, MD;  Location: [facility] CATH  LAB;  Service: Cardiovascular;  Laterality: N/A;    There were no vitals filed for this visit.   Subjective Assessment - 06/13/21 1102     Subjective Patient reports that L shoulder is doing well, just a little tight this morning.    Pertinent History ACDF    Limitations Lifting;House hold activities    Diagnostic tests MRI: arthritis, bone spur, slight tear    Patient Stated Goals to improve shoulder mobility and decrease pain    Currently in Pain? Yes    Pain Score 3     Pain Location Shoulder    Pain Orientation Left    Pain Descriptors / Indicators Tightness    Pain Onset More than a month ago                               [facility] Adult PT Treatment/Exercise - 06/13/21 0001       Exercises   Exercises Shoulder      Shoulder Exercises: Standing   Protraction 10 reps    Theraband Level (Shoulder Protraction) Level 2 (Red)    Protraction Limitations tactile cues, demo, 2 sets    External Rotation Left;10 reps    Theraband Level (Shoulder External Rotation) Level  2 (Red)    External Rotation Weight (lbs) VC, demo, 2 sets    Internal Rotation Left;10 reps    Theraband Level (Shoulder Internal Rotation) Level 2 (Red)    Internal Rotation Limitations VC and demo, 2 sets of 10    Flexion 10 reps;Both    Theraband Level (Shoulder Flexion) Level 2 (Red)    Flexion Limitations demo, VC, 2 sets    Extension 20 reps    Theraband Level (Shoulder Extension) Level 2 (Red)    Extension Limitations demo, 2 sets of 10    Retraction 10 reps;Both    Theraband Level (Shoulder Retraction) Level 2 (Red)    Retraction Limitations 2 sets, tactile cues      Shoulder Exercises: Pulleys   Scaption 5 minutes    Scaption Limitations 30 sec hold at end range.      Shoulder Exercises: ROM/Strengthening   Wall Wash flexion 2 x 10, scaption 2 x 10    Wall Pushups 20 reps      Manual Therapy   Manual Therapy Soft tissue mobilization    Soft tissue mobilization STM to  anterior deltoids/bicpes, cross friction to pectoralis attachment                    PT Education - 06/13/21 1153     Education Details progressed HEP for shoulder strengthening and stretches, red theraband issued    Person(s) Educated Patient    Methods Explanation;Handout;Demonstration;Tactile cues;Verbal cues              PT Short Term Goals - 06/06/21 1431       PT SHORT TERM GOAL #1   Title Pt will be I and compliant with initial HEP.    Baseline provided at eval    Time 3    Period Weeks    Status New    Target Date 06/27/21      PT SHORT TERM GOAL #2   Title Pt will improve L shoulder PROM abduction to >/= 90.    Baseline 64 with guarding    Time 3    Period Weeks    Status New    Target Date 06/27/21               PT Long Term Goals - 06/06/21 1432       PT LONG TERM GOAL #1   Title Pt will be independent with advanced HEP for maintenance and continued progress post physical therapy.    Time 8    Period Weeks    Status New    Target Date 08/01/21      PT LONG TERM GOAL #2   Title Pt will demonstrate L shoulder AROM WFL and </= 3/10 pain, as needed to complete ADLs.    Baseline see flowsheet    Time 8    Period Weeks    Status New    Target Date 08/01/21      PT LONG TERM GOAL #3   Title Pt will be able to comb and wash hair without difficulty and </= 3/10 pain.    Baseline see flowsheet    Time 8    Period Weeks    Status New    Target Date 08/01/21      PT LONG TERM GOAL #4   Title Pt will increase FOTO ability to at least 68% ability, in order to demonstrate meaningful change in perceived level of functional ability.    Baseline 58% ability  Time 8    Period Weeks    Status New    Target Date 08/01/21      PT LONG TERM GOAL #5   Title Pt will be able to reach overhead, in order to perform IADLs like putting dishes into cabinet.    Baseline does not use L UE currently    Time 8    Period Weeks    Status New     Target Date 08/01/21                   Plan - 06/13/21 1157     Clinical Impression Statement Patient is making good progress and was able to flex L shoulder to 120 deg, but still limited to ~85 deg horizontal abduction.  Reports more tightness than pain with exercises.  HEP was advanced to includine theraband exercises, red TB issued, and sleeper stretch also added to HEP to improve IR.  Pt. declined CP today and will apply at home.  Will benefit from continued therapy to improve L shoulder ROM to improve ADLs.    Personal Factors and Comorbidities Comorbidity 2;Time since onset of injury/illness/exacerbation    Comorbidities Thyroid disease, nerve/muscle disease    Examination-Activity Limitations Bathing;Dressing;Sleep;Hygiene/Grooming;Reach Overhead;Lift    Examination-Participation Restrictions Laundry;Cleaning    Stability/Clinical Decision Making Stable/Uncomplicated    Rehab Potential Good    PT Frequency --   1-2x/week   PT Duration 8 weeks    PT Treatment/Interventions ADLs/Self Care Home Management;Cryotherapy;Electrical Stimulation;Iontophoresis 4mg /ml Dexamethasone;Moist Heat;Functional mobility training;Therapeutic activities;Therapeutic exercise;Neuromuscular re-education;Patient/family education;Manual techniques;Passive range of motion;Dry needling;Taping;Vasopneumatic Device;Spinal Manipulations;Joint Manipulations    PT Next Visit Plan Assess response to HEP/update PRN, address thoracic hypomobility, modalities and manual therapy as indicated    PT Home Exercise Plan [email or web address] (updated 7/21)    Consulted and Agree with Plan of Care Patient             Patient will benefit from skilled therapeutic intervention in order to improve the following deficits and impairments:  Pain, Postural dysfunction, Impaired UE functional use, Impaired flexibility, Increased fascial restricitons, Decreased strength, Improper body mechanics, Impaired perceived functional ability,  Decreased range of motion  Visit Diagnosis: Acute pain of left shoulder  Abnormal posture  Stiffness of left shoulder, not elsewhere classified  Muscle weakness (generalized)     Problem List Patient Active Problem List   Diagnosis Date Noted   History of hemorrhoidectomy 03/30/2020   Carpal tunnel syndrome of right wrist 02/29/2020   History of fusion of cervical spine 01/31/2020   Cervical radiculopathy 01/31/2020   Peripheral nerve disease 01/31/2020   Melanocytic neoplasm of skin 12/07/2019   Nail abnormality 12/07/2019   Lumbosacral radiculitis 01/04/2019   Gastroesophageal reflux disease without esophagitis 07/12/2018   Globus pharyngeus 07/12/2018   Somatic complaints, multiple 08/14/2016   Headache 08/12/2016   Upper airway cough syndrome 11/10/2014   Chest pain 05/19/2014   Hyperlipidemia 11/03/2013   Abdominal pain 12/12/2012   Premature ventricular contraction 07/11/2011   Other neutropenia ([place]) 07/31/2010   THYROID NODULE 05/04/2009    Rennie Natter PT, DPT 06/13/2021, 12:01 PM  [facility] [address]  [place], [place], [address] Phone: [phone]   Fax:  [phone]  Name: Mariah Everett MRN: [id number] Date of Birth: 29-Jul-1956

## 2021-06-13 NOTE — Progress Notes (Signed)
Acute Office Visit  Subjective:    Patient ID: Mariah Everett, female    DOB: February 01, 1956, 65 y.o.   MRN: [id number]  No chief complaint on file.   HPI Patient is in today for vulvar pain/lesion  She reports noticing discomfort around her clitoris with wiping after voiding 2 days ago She then noted some swelling and a whitish rash  She reports she has a hx of HSV but has not had an outbreak for many years She is sexually active with 1 female partner (husband) and has chronic dyspareunia from atrophic vaginitis She reports generally not feeling well for several days - pain in the bottom of her feet, nausea, lower abdominal discomfort  Past Medical History:  Diagnosis Date   CERUMEN IMPACTION, BILATERAL 09/03/2009   Qualifier: Diagnosis of  By: Royal Piedra NP, Tammy     CERVICAL RADICULOPATHY 12/15/2008   - MRI 12/22/2008 Pos C7 disc protrusion > Discectomy and fusion 05/18/2009 [facility] [place]      Chronic benign neutropenia ([place])    Esophageal dysmotility    Loss of weight 08/12/2016   Neck pain    radicular C7/8 right. MRI 12-22-18 pos C7 disc protrusion   Preventative health care 11/03/2013   Followed as Primary Care Patient/ [facility]/ [facility]     Rectal bleeding 07/11/2011   Negative colonoscopy 09/12/11  Dr Genelle Gather    Sciatica of left side 11/10/2014   Followed as Primary Care Patient/ [facility]/ [facility] > referred  Back to [facility]     Shoulder pain    Thyroid nodule    a. Bx/TFTs normal by prior workup, clinically benign.    Past Surgical History:  Procedure Laterality Date   BIOPSY THYROID     CERVICAL DISCECTOMY  05-18-2009   CERVICAL FUSION  05-18-2009   HEMORROIDECTOMY     1990   LEFT HEART CATHETERIZATION WITH CORONARY ANGIOGRAM N/A 05/19/2014   Procedure: LEFT HEART CATHETERIZATION WITH CORONARY ANGIOGRAM;  Surgeon: Jolaine Artist, MD;  Location: [facility] CATH LAB;  Service: Cardiovascular;  Laterality: N/A;    Family History  Problem Relation Age of  Onset   Ovarian cancer Mother    Lung cancer Mother    Cervical cancer Mother    High blood pressure Mother    Stroke Mother    Throat cancer Father    Lupus Sister    High blood pressure Sister    Diabetes Son    High blood pressure Other     Social History   Socioeconomic History   Marital status: Married    Spouse name: Zyanna Leisinger   Number of children: 1   Years of education: Not on file   Highest education level: Some college, no degree  Occupational History   Occupation: insurance   Occupation: Patient Insurance account manager: [employer]  Tobacco Use   Smoking status: Never   Smokeless tobacco: Never  Vaping Use   Vaping Use: Never used  Substance and Sexual Activity   Alcohol use: Not Currently    Comment: occ   Drug use: No   Sexual activity: Yes    Partners: Male    Birth control/protection: None, Post-menopausal  Other Topics Concern   Not on file  Social History Narrative   Not on file   Social Determinants of Health   Financial Resource Strain: Low Risk    Difficulty of Paying Living Expenses: Not hard at all  Food Insecurity: No  Food Insecurity   Worried About Charity fundraiser in the Last Year: Never true   Ran Out of Food in the Last Year: Never true  Transportation Needs: No Transportation Needs   Lack of Transportation (Medical): No   Lack of Transportation (Non-Medical): No  Physical Activity: Insufficiently Active   Days of Exercise per Week: 4 days   Minutes of Exercise per Session: 20 min  Stress: No Stress Concern Present   Feeling of Stress : Not at all  Social Connections: Socially Integrated   Frequency of Communication with Friends and Family: Twice a week   Frequency of Social Gatherings with Friends and Family: More than three times a week   Attends Religious Services: 1 to 4 times per year   Active Member of [employer] or Organizations: Yes   Attends Archivist Meetings: 1 to 4 times per year    Marital Status: Married  Human resources officer Violence: Not At Risk   Fear of Current or Ex-Partner: No   Emotionally Abused: No   Physically Abused: No   Sexually Abused: No    Outpatient Medications Prior to Visit  Medication Sig Dispense Refill   dicyclomine (BENTYL) 10 MG capsule dicyclomine 10 mg capsule     famotidine (PEPCID) 20 MG tablet Take 20 mg by mouth 2 (two) times daily.     gabapentin (NEURONTIN) 300 MG capsule gabapentin 300 mg capsule     Hyoscyamine Sulfate SL 0.125 MG SUBL DISSOLVE 1 TABLET WHEN ESOPHAGEAL SPASM OCCURS NO MORE THAN 4/DAY. 32 tablet 0   Multiple Vitamin (MULTIVITAMIN) tablet Take 1 tablet by mouth daily.     omeprazole (PRILOSEC) 20 MG capsule Take 1 capsule (20 mg total) by mouth daily. 90 capsule 3   TURMERIC PO Take 1,000 mg by mouth daily.     albuterol (VENTOLIN HFA) 108 (90 Base) MCG/ACT inhaler Inhale 2 puffs by mouth every 4 hours only as needed (Patient not taking: Reported on 06/06/2021) 18 g 0   benzonatate (TESSALON) 200 MG capsule Take 1 capsule (200 mg total) by mouth 3 (three) times daily as needed. (Patient not taking: Reported on 06/06/2021) 30 capsule 0   CALCIUM-MAGNESIUM-ZINC PO Take 1 tablet by mouth.     COVID-19 mRNA Vac-TriS, Pfizer, (PFIZER-BIONT COVID-19 VAC-TRIS) SUSP injection Inject into the muscle. (Patient not taking: Reported on 06/06/2021) 0.3 mL 0   COVID-19 mRNA vaccine, Pfizer, 30 MCG/0.3ML injection AS DIRECTED (Patient not taking: Reported on 06/06/2021) .3 mL 0   estradiol (ESTRACE) 0.1 MG/GM vaginal cream PLACE 1 APPLICATORFUL VAGINALLY AT BEDTIME FOR 7 DAYS, THEN 1 APPLICATORFUL 3 (THREE) TIMES A WEEK. (Patient not taking: Reported on 06/06/2021) 42.5 g 5   meloxicam (MOBIC) 7.5 MG tablet TAKE 1-2 TABLETS (7.5-15 MG TOTAL) BY MOUTH DAILY AS NEEDED FOR PAIN. (Patient not taking: Reported on 06/06/2021) 60 tablet 3   Methen-Hyosc-Meth Blue-Na Phos (UROGESIC-BLUE) 81.6 MG TABS Urogesic-Blue 81.6 mg-40.8 mg-0.12 mg tablet (Patient  not taking: Reported on 06/06/2021)     predniSONE (DELTASONE) 10 MG tablet Take 60mg  by mouth x 2 days, 50mg  x 2 days, 40mg  x 2 days, 30mg  x 2 days, 20mg  x 2 days, 10mg  x 2 days (Patient not taking: Reported on 06/11/2021) 42 tablet 0   No facility-administered medications prior to visit.    No Known Allergies  Review of Systems  Constitutional:  Positive for fatigue (malaise). Negative for fever.  HENT: Negative.    Respiratory: Negative.    Cardiovascular: Negative.  Gastrointestinal:  Negative for blood in stool, constipation, diarrhea, nausea and vomiting.  Genitourinary:  Positive for dyspareunia (chronic) and genital sores. Negative for dysuria, hematuria and pelvic pain.  Skin: Negative.   Neurological: Negative.   Psychiatric/Behavioral:  The patient is nervous/anxious.       Objective:    Physical Exam Vitals reviewed. Exam conducted with a chaperone present.  Constitutional:      Appearance: She is not ill-appearing or toxic-appearing.  Cardiovascular:     Rate and Rhythm: Normal rate and regular rhythm.     Heart sounds: Normal heart sounds. No murmur heard. Pulmonary:     Effort: Pulmonary effort is normal.     Breath sounds: Normal breath sounds.  Abdominal:     General: Abdomen is flat.     Tenderness: There is abdominal tenderness in the right upper quadrant, periumbilical area and suprapubic area. There is no right CVA tenderness, left CVA tenderness or guarding.  Genitourinary:    Labia:        Right: Tenderness present. No lesion.        Left: No tenderness or lesion.     Neurological:     Mental Status: She is alert.  Psychiatric:        Behavior: Behavior normal.        Thought Content: Thought content normal.        Judgment: Judgment normal.    BP (!) 152/97   Pulse 90   Temp 99 F (37.2 C)   SpO2 98%  Wt Readings from Last 3 Encounters:  01/28/21 132 lb 0.9 oz (59.9 kg)  11/14/20 132 lb 1.6 oz (59.9 kg)  10/03/20 127 lb (57.6 kg)   BP  Readings from Last 3 Encounters:  06/13/21 (!) 152/97  01/29/21 126/82  11/14/20 124/88   Pulse Readings from Last 3 Encounters:  06/13/21 90  01/29/21 74  11/14/20 86     Health Maintenance Due  Topic Date Due   PNA vac Low Risk Adult (2 of 2 - PCV13) 06/28/2020    There are no preventive care reminders to display for this patient.   Lab Results  Component Value Date   TSH 1.54 10/03/2019   Lab Results  Component Value Date   WBC 4.3 01/28/2021   HGB 14.4 01/28/2021   HCT 42.5 01/28/2021   MCV 88.7 01/28/2021   PLT 223 01/28/2021   Lab Results  Component Value Date   NA 138 01/28/2021   K 3.3 (L) 01/28/2021   CO2 24 01/28/2021   GLUCOSE 105 (H) 01/28/2021   BUN 8 01/28/2021   CREATININE 0.60 01/28/2021   BILITOT 1.1 01/28/2021   ALKPHOS 31 (L) 01/28/2021   AST 18 01/28/2021   ALT 18 01/28/2021   PROT 7.1 01/28/2021   ALBUMIN 4.4 01/28/2021   CALCIUM 9.0 01/28/2021   ANIONGAP 10 01/28/2021   GFR 122.50 08/12/2017   Lab Results  Component Value Date   CHOL 176 09/07/2020   Lab Results  Component Value Date   HDL 73 09/07/2020   Lab Results  Component Value Date   LDLCALC 89 09/07/2020   Lab Results  Component Value Date   TRIG 49 09/07/2020   Lab Results  Component Value Date   CHOLHDL 2.4 09/07/2020   Lab Results  Component Value Date   HGBA1C 5.2 09/07/2020   Recent Results (from the past 2160 hour(s))  POCT URINALYSIS DIP (CLINITEK)     Status: None   Collection Time: 06/13/21  4:35 PM  Result Value Ref Range   Color, UA yellow yellow   Clarity, UA clear clear   Glucose, UA negative negative mg/dL   Bilirubin, UA negative negative   Ketones, POC UA negative negative mg/dL   Spec Grav, UA 1.020 1.010 - 1.025   Blood, UA negative negative   pH, UA 6.5 5.0 - 8.0   POC PROTEIN,UA negative negative, trace   Urobilinogen, UA 0.2 0.2 or 1.0 E.U./dL   Nitrite, UA Negative Negative   Leukocytes, UA Negative Negative  WET PREP FOR  TRICH, YEAST, CLUE     Status: None   Collection Time: 06/17/21  7:31 AM   Specimen: Vaginal; GYN  Result Value Ref Range   MICRO NUMBER: [id number]    Specimen Quality Adequate    SOURCE: NOT GIVEN    Status FINAL    RESULT      No Trichomonas vaginalis seen. No yeast seen No clue cells seen Epithelial Cells Present       Assessment & Plan:   Problem List Items Addressed This Visit       Other   Abdominal pain   Other Visit Diagnoses     Inflammation of clitoris    -  Primary   Relevant Medications   cephALEXin (KEFLEX) 500 MG capsule   valACYclovir (VALTREX) 500 MG tablet   Nausea       Relevant Medications   ondansetron (ZOFRAN-ODT) 4 MG disintegrating tablet      Vulvar lesion - examined by myself and Dr. Madilyn Fireman - Given patient's hs of HSV, will cover for genital HSV and abscess with Keflex and Valtrex   Abdominal pain/malaise/nausea - UA personally reviewed and negative, urine culture pending Zofran 4 mg Q8H prn nausea  Counseled on Er precautions Follow-up in 3 days   Meds ordered this encounter  Medications   cephALEXin (KEFLEX) 500 MG capsule    Sig: Take 1 capsule (500 mg total) by mouth 2 (two) times daily for 7 days.    Dispense:  14 capsule    Refill:  0    Order Specific Question:   Supervising Provider    Answer:   Emeterio Reeve [[id number]]   valACYclovir (VALTREX) 500 MG tablet    Sig: Take 1 tablet (500 mg total) by mouth 2 (two) times daily for 3 days.    Dispense:  6 tablet    Refill:  0    Order Specific Question:   Supervising Provider    Answer:   Emeterio Reeve [[id number]]   ondansetron (ZOFRAN-ODT) 4 MG disintegrating tablet    Sig: Take 1 tablet (4 mg total) by mouth every 8 (eight) hours as needed for nausea or vomiting.    Dispense:  4 tablet    Refill:  0    Order Specific Question:   Supervising Provider    Answer:   Emeterio Reeve [[id number]]     Trixie Dredge, PA-C

## 2021-06-17 ENCOUNTER — Ambulatory Visit (INDEPENDENT_AMBULATORY_CARE_PROVIDER_SITE_OTHER): Payer: 59 | Admitting: Family Medicine

## 2021-06-17 ENCOUNTER — Encounter: Payer: Self-pay | Admitting: Family Medicine

## 2021-06-17 ENCOUNTER — Other Ambulatory Visit: Payer: Self-pay

## 2021-06-17 VITALS — BP 128/81 | HR 89 | Ht 65.0 in | Wt 127.0 lb

## 2021-06-17 DIAGNOSIS — R1084 Generalized abdominal pain: Secondary | ICD-10-CM

## 2021-06-17 DIAGNOSIS — N9089 Other specified noninflammatory disorders of vulva and perineum: Secondary | ICD-10-CM

## 2021-06-17 DIAGNOSIS — N762 Acute vulvitis: Secondary | ICD-10-CM | POA: Insufficient documentation

## 2021-06-17 LAB — WET PREP FOR TRICH, YEAST, CLUE
MICRO NUMBER:: 12157965
Specimen Quality: ADEQUATE

## 2021-06-17 NOTE — Patient Instructions (Signed)
Complete your antibiotic and call us back if the lesion is not improved by Friday. Keep apply your vaseline.

## 2021-06-17 NOTE — Progress Notes (Signed)
Established Patient Office Visit  Subjective:  Patient ID: Mariah Everett, female    DOB: Jan 26, 1956  Age: 65 y.o. MRN: [id number]  CC:  Chief Complaint  Patient presents with   Follow-up    HPI Mariah Everett presents for   F/u vulvar lesion and abdominal pain.  She is feeling better overall.  Her abd pain is much better no blood in her urine or stool.  The bottoms of her feet are no longer hurting.  She also feels like the whitish discoloration of the lesion in the vulvar area is gone and now it just looks like a little raw spot.  She says it still tender but is a little better.  No fevers or chills.  Past Medical History:  Diagnosis Date   CERUMEN IMPACTION, BILATERAL 09/03/2009   Qualifier: Diagnosis of  By: Royal Piedra NP, Tammy     CERVICAL RADICULOPATHY 12/15/2008   - MRI 12/22/2008 Pos C7 disc protrusion > Discectomy and fusion 05/18/2009 [facility] [place]      Chronic benign neutropenia ([place])    Esophageal dysmotility    Loss of weight 08/12/2016   Neck pain    radicular C7/8 right. MRI 12-22-18 pos C7 disc protrusion   Preventative health care 11/03/2013   Followed as Primary Care Patient/ [facility]/ [facility]     Rectal bleeding 07/11/2011   Negative colonoscopy 09/12/11  Dr Genelle Gather    Sciatica of left side 11/10/2014   Followed as Primary Care Patient/ [facility]/ [facility] > referred  Back to [facility]     Shoulder pain    Thyroid nodule    a. Bx/TFTs normal by prior workup, clinically benign.    Past Surgical History:  Procedure Laterality Date   BIOPSY THYROID     CERVICAL DISCECTOMY  05-18-2009   CERVICAL FUSION  05-18-2009   HEMORROIDECTOMY     1990   LEFT HEART CATHETERIZATION WITH CORONARY ANGIOGRAM N/A 05/19/2014   Procedure: LEFT HEART CATHETERIZATION WITH CORONARY ANGIOGRAM;  Surgeon: Jolaine Artist, MD;  Location: [facility] CATH LAB;  Service: Cardiovascular;  Laterality: N/A;    Family History  Problem Relation Age of Onset   Ovarian cancer Mother     Lung cancer Mother    Cervical cancer Mother    High blood pressure Mother    Stroke Mother    Throat cancer Father    Lupus Sister    High blood pressure Sister    Diabetes Son    High blood pressure Other     Social History   Socioeconomic History   Marital status: Married    Spouse name: Eira Nanton   Number of children: 1   Years of education: Not on file   Highest education level: Some college, no degree  Occupational History   Occupation: insurance   Occupation: Patient Insurance account manager: [employer]  Tobacco Use   Smoking status: Never   Smokeless tobacco: Never  Vaping Use   Vaping Use: Never used  Substance and Sexual Activity   Alcohol use: Not Currently    Comment: occ   Drug use: No   Sexual activity: Yes    Partners: Male    Birth control/protection: None, Post-menopausal  Other Topics Concern   Not on file  Social History Narrative   Not on file   Social Determinants of Health   Financial Resource Strain: Low Risk    Difficulty of Paying Living Expenses: Not hard  at all  Food Insecurity: No Food Insecurity   Worried About Charity fundraiser in the Last Year: Never true   Ran Out of Food in the Last Year: Never true  Transportation Needs: No Transportation Needs   Lack of Transportation (Medical): No   Lack of Transportation (Non-Medical): No  Physical Activity: Insufficiently Active   Days of Exercise per Week: 4 days   Minutes of Exercise per Session: 20 min  Stress: No Stress Concern Present   Feeling of Stress : Not at all  Social Connections: Socially Integrated   Frequency of Communication with Friends and Family: Twice a week   Frequency of Social Gatherings with Friends and Family: More than three times a week   Attends Religious Services: 1 to 4 times per year   Active Member of [employer] or Organizations: Yes   Attends Archivist Meetings: 1 to 4 times per year   Marital Status: Married  Arboriculturist Violence: Not At Risk   Fear of Current or Ex-Partner: No   Emotionally Abused: No   Physically Abused: No   Sexually Abused: No    Outpatient Medications Prior to Visit  Medication Sig Dispense Refill   cephALEXin (KEFLEX) 500 MG capsule Take 1 capsule (500 mg total) by mouth 2 (two) times daily for 7 days. 14 capsule 0   famotidine (PEPCID) 20 MG tablet Take 20 mg by mouth 2 (two) times daily.     gabapentin (NEURONTIN) 300 MG capsule gabapentin 300 mg capsule     Hyoscyamine Sulfate SL 0.125 MG SUBL DISSOLVE 1 TABLET WHEN ESOPHAGEAL SPASM OCCURS NO MORE THAN 4/DAY. 32 tablet 0   Multiple Vitamin (MULTIVITAMIN) tablet Take 1 tablet by mouth daily.     omeprazole (PRILOSEC) 20 MG capsule Take 1 capsule (20 mg total) by mouth daily. 90 capsule 3   TURMERIC PO Take 1,000 mg by mouth daily.     dicyclomine (BENTYL) 10 MG capsule dicyclomine 10 mg capsule     ondansetron (ZOFRAN-ODT) 4 MG disintegrating tablet Take 1 tablet (4 mg total) by mouth every 8 (eight) hours as needed for nausea or vomiting. 4 tablet 0   No facility-administered medications prior to visit.    No Known Allergies  ROS Review of Systems    Objective:    Physical Exam  BP 128/81   Pulse 89   Ht '5\' 5"'$  (1.651 m)   Wt 127 lb (57.6 kg)   SpO2 100%   BMI 21.13 kg/m  Wt Readings from Last 3 Encounters:  06/17/21 127 lb (57.6 kg)  01/28/21 132 lb 0.9 oz (59.9 kg)  11/14/20 132 lb 1.6 oz (59.9 kg)     Health Maintenance Due  Topic Date Due   PNA vac Low Risk Adult (2 of 2 - PCV13) 06/28/2020    There are no preventive care reminders to display for this patient.  Lab Results  Component Value Date   TSH 1.54 10/03/2019   Lab Results  Component Value Date   WBC 4.3 01/28/2021   HGB 14.4 01/28/2021   HCT 42.5 01/28/2021   MCV 88.7 01/28/2021   PLT 223 01/28/2021   Lab Results  Component Value Date   NA 138 01/28/2021   K 3.3 (L) 01/28/2021   CO2 24 01/28/2021   GLUCOSE 105 (H)  01/28/2021   BUN 8 01/28/2021   CREATININE 0.60 01/28/2021   BILITOT 1.1 01/28/2021   ALKPHOS 31 (L) 01/28/2021   AST 18 01/28/2021  ALT 18 01/28/2021   PROT 7.1 01/28/2021   ALBUMIN 4.4 01/28/2021   CALCIUM 9.0 01/28/2021   ANIONGAP 10 01/28/2021   GFR 122.50 08/12/2017   Lab Results  Component Value Date   CHOL 176 09/07/2020   Lab Results  Component Value Date   HDL 73 09/07/2020   Lab Results  Component Value Date   LDLCALC 89 09/07/2020   Lab Results  Component Value Date   TRIG 49 09/07/2020   Lab Results  Component Value Date   CHOLHDL 2.4 09/07/2020   Lab Results  Component Value Date   HGBA1C 5.2 09/07/2020      Assessment & Plan:   Problem List Items Addressed This Visit       Other   Abdominal pain   Other Visit Diagnoses     Inflammation of clitoris    -  Primary   Vulvar lesion       Relevant Orders   WET PREP FOR [place], YEAST, CLUE      Vulvar lesion-it actually looks much better I was able to examine it last week when she saw Charlie.  She just completed her antiviral yesterday and still has a few more days in the antibiotic which I encouraged her to complete.  If not significantly better by the end of the please give [place] a call back and let us know.  She was worried about the possibility of HPV but I do not see any evidence of that.  It actually really does look gets healing she still has a little raw area measuring approximately 3 mm.  Did go ahead and perform a wet prep today as well.  Abdominal pain-much improved.  UA was negative.  No orders of the defined types were placed in this encounter.   Follow-up: No follow-ups on file.    Beatrice Lecher, MD

## 2021-06-18 ENCOUNTER — Encounter: Payer: Self-pay | Admitting: Physical Therapy

## 2021-06-18 ENCOUNTER — Ambulatory Visit: Payer: 59 | Admitting: Physical Therapy

## 2021-06-18 ENCOUNTER — Other Ambulatory Visit: Payer: Self-pay

## 2021-06-18 DIAGNOSIS — M6281 Muscle weakness (generalized): Secondary | ICD-10-CM | POA: Diagnosis not present

## 2021-06-18 DIAGNOSIS — M25612 Stiffness of left shoulder, not elsewhere classified: Secondary | ICD-10-CM

## 2021-06-18 DIAGNOSIS — R293 Abnormal posture: Secondary | ICD-10-CM | POA: Diagnosis not present

## 2021-06-18 DIAGNOSIS — M25512 Pain in left shoulder: Secondary | ICD-10-CM

## 2021-06-18 NOTE — Patient Instructions (Signed)
Access Code: [email or web address] URL: [email or web address] Date: 06/18/2021 Prepared by: Glenetta Hew  Exercises Supine Shoulder Flexion AAROM - 2 x daily - 7 x weekly - 1-2 sets - 10 reps Doorway Pec Stretch at 60 Degrees Abduction with Arm Straight - 3 x daily - 7 x weekly - 3 sets - 20-30 seconds hold Corner Pec Minor Stretch - 3 x daily - 7 x weekly - 3 sets - 10 reps - 20-30 seconds hold Seated Scapular Retraction - 2 x daily - 7 x weekly - 3 sets - 10 reps Shoulder External Rotation with Anchored Resistance - 1 x daily - 7 x weekly - 2 sets - 10 reps Shoulder Internal Rotation with Resistance - 1 x daily - 7 x weekly - 2 sets - 10 reps Shoulder Extension with Resistance - 1 x daily - 7 x weekly - 2 sets - 10 reps Scapular Retraction with Resistance - 1 x daily - 7 x weekly - 2 sets - 10 reps Wall Push Up - 1 x daily - 7 x weekly - 2 sets - 10 reps Scaption Wall Slide with Towel - 1 x daily - 7 x weekly - 2 sets - 10 reps Sleeper Stretch - 1 x daily - 7 x weekly - 1 sets - 10 reps - 10 hold Standing Shoulder External Rotation Stretch in Doorway - 2 x daily - 7 x weekly - 1 sets - 5 reps - 30 sec hold * Standing Shoulder Posterior Capsule Stretch - 2 x daily - 7 x weekly - 1 sets - 5 reps - 30 hold *  *added to HEP

## 2021-06-18 NOTE — Therapy (Signed)
[facility] [address]  [place], [place], [address] Phone: [phone]   Fax:  [phone]  Physical Therapy Treatment  Patient Details  Name: Mariah Everett MRN: [id number] Date of Birth: Aug 08, 1956 Referring Provider (PT): Sydnee Cabal, MD   Encounter Date: 06/18/2021   PT End of Session - 06/18/21 1200     Visit Number 4    Number of Visits 16    Date for PT Re-Evaluation 08/01/21    Authorization Type [employer]    PT Start Time 1101    PT Stop Time 1150    PT Time Calculation (min) 49 min    Activity Tolerance Patient tolerated treatment well    Behavior During Therapy [facility] for tasks assessed/performed             Past Medical History:  Diagnosis Date   CERUMEN IMPACTION, BILATERAL 09/03/2009   Qualifier: Diagnosis of  By: Royal Piedra NP, Tammy     CERVICAL RADICULOPATHY 12/15/2008   - MRI 12/22/2008 Pos C7 disc protrusion > Discectomy and fusion 05/18/2009 [facility] in [place]      Chronic benign neutropenia ([place])    Esophageal dysmotility    Loss of weight 08/12/2016   Neck pain    radicular C7/8 right. MRI 12-22-18 pos C7 disc protrusion   Preventative health care 11/03/2013   Followed as Primary Care Patient/ [facility]/ [facility]     Rectal bleeding 07/11/2011   Negative colonoscopy 09/12/11  Dr Genelle Gather    Sciatica of left side 11/10/2014   Followed as Primary Care Patient/ [facility]/ [facility] > referred  Back to [facility] NS     Shoulder pain    Thyroid nodule    a. Bx/TFTs normal by prior workup, clinically benign.    Past Surgical History:  Procedure Laterality Date   BIOPSY THYROID     CERVICAL DISCECTOMY  05-18-2009   CERVICAL FUSION  05-18-2009   HEMORROIDECTOMY     1990   LEFT HEART CATHETERIZATION WITH CORONARY ANGIOGRAM N/A 05/19/2014   Procedure: LEFT HEART CATHETERIZATION WITH CORONARY ANGIOGRAM;  Surgeon: Jolaine Artist, MD;  Location: [facility] CATH LAB;  Service:  Cardiovascular;  Laterality: N/A;    There were no vitals filed for this visit.   Subjective Assessment - 06/18/21 1105     Subjective Patient reports that L shoulder is feeling better.  Started on antibiotc (keflex) this week.  Overall hasn't had much pain, even with those little movements that before were irritating.    Pertinent History ACDF    Limitations Lifting;House hold activities    Diagnostic tests MRI: arthritis, bone spur, slight tear    Patient Stated Goals to improve shoulder mobility and decrease pain    Currently in Pain? No/denies    Pain Location Shoulder    Pain Orientation Left    Pain Onset More than a month ago                               [facility] Adult PT Treatment/Exercise - 06/18/21 0001       Exercises   Exercises Shoulder      Shoulder Exercises: Seated   Other Seated Exercises seated I,Y,T's x 10 with 1#, x 10 with 2#, demo and VC given      Shoulder Exercises: Standing   External Rotation Both;20 reps;Theraband    Theraband Level (Shoulder External Rotation) Level 2 (  Red)    External Rotation Limitations bilateral, VC    Diagonals Theraband;Both;10 reps   ; 3 sets; D2 pattern   Theraband Level (Shoulder Diagonals) Level 2 (Red)      Shoulder Exercises: ROM/Strengthening   UBE (Upper Arm Bike) level 1 3 min f/3 min b    Wall Wash flexion x 10, scaption x 10, abduction x 10    Wall Pushups 20 reps    Other ROM/Strengthening Exercises posterior capsule stretch, 2 x 30 sec, ER stretch in doorway 2 x 30 sec, VC needed.      Manual Therapy   Manual Therapy Soft tissue mobilization;Joint mobilization    Joint Mobilization capsule stretch inferior and posterio    Soft tissue mobilization STM to anterior deltoids/biceps, cross friction to pectoralis attachment    Passive ROM into ER and IR                    PT Education - 06/18/21 1200     Education Details reviewed and progressed HEP.  Access Code: [email or web address]     Person(s) Educated Patient    Methods Explanation;Demonstration;Tactile cues;Handout    Comprehension Verbalized understanding              PT Short Term Goals - 06/18/21 1203       PT SHORT TERM GOAL #1   Title Pt will be I and compliant with initial HEP.    Baseline provided at eval    Time 3    Period Weeks    Status Achieved    Target Date 06/27/21      PT SHORT TERM GOAL #2   Title Pt will improve L shoulder PROM abduction to >/= 90.    Baseline 64 with guarding; met 120 deg PROM    Time 3    Period Weeks    Status Achieved    Target Date 06/27/21               PT Long Term Goals - 06/06/21 1432       PT LONG TERM GOAL #1   Title Pt will be independent with advanced HEP for maintenance and continued progress post physical therapy.    Time 8    Period Weeks    Status New    Target Date 08/01/21      PT LONG TERM GOAL #2   Title Pt will demonstrate L shoulder AROM WFL and </= 3/10 pain, as needed to complete ADLs.    Baseline see flowsheet    Time 8    Period Weeks    Status New    Target Date 08/01/21      PT LONG TERM GOAL #3   Title Pt will be able to comb and wash hair without difficulty and </= 3/10 pain.    Baseline see flowsheet    Time 8    Period Weeks    Status New    Target Date 08/01/21      PT LONG TERM GOAL #4   Title Pt will increase FOTO ability to at least 68% ability, in order to demonstrate meaningful change in perceived level of functional ability.    Baseline 58% ability    Time 8    Period Weeks    Status New    Target Date 08/01/21      PT LONG TERM GOAL #5   Title Pt will be able to reach overhead, in order to perform IADLs like putting dishes  into cabinet.    Baseline does not use L UE currently    Time 8    Period Weeks    Status New    Target Date 08/01/21                   Plan - 06/18/21 1201     Clinical Impression Statement Pt. continues to make good progress and has met her short term goals,  reporting decreased pain and improved AROM.  Today L shoulder PROM flexion 145 deg, 120 deg of abduction (supine), most limited in ER which was 40 deg (IR 55 deg).  Given new stretches for ER and posterior capsule.  Able to complete all exercises today without increased pain.  Would benefit from continued therapy to improve L shoulder ROM for ADLs.    Personal Factors and Comorbidities Comorbidity 2;Time since onset of injury/illness/exacerbation    Comorbidities Thyroid disease, nerve/muscle disease    Examination-Activity Limitations Bathing;Dressing;Sleep;Hygiene/Grooming;Reach Overhead;Lift    Examination-Participation Restrictions Laundry;Cleaning    Stability/Clinical Decision Making Stable/Uncomplicated    Rehab Potential Good    PT Frequency --   1-2x/week   PT Duration 8 weeks    PT Treatment/Interventions ADLs/Self Care Home Management;Cryotherapy;Electrical Stimulation;Iontophoresis 63m/ml Dexamethasone;Moist Heat;Functional mobility training;Therapeutic activities;Therapeutic exercise;Neuromuscular re-education;Patient/family education;Manual techniques;Passive range of motion;Dry needling;Taping;Vasopneumatic Device;Spinal Manipulations;Joint Manipulations    PT Next Visit Plan Assess response to HEP/update PRN, address thoracic hypomobility, modalities and manual therapy as indicated    PT Home Exercise Plan [email or web address](updated 7/21; 7/26)    Consulted and Agree with Plan of Care Patient             Patient will benefit from skilled therapeutic intervention in order to improve the following deficits and impairments:  Pain, Postural dysfunction, Impaired UE functional use, Impaired flexibility, Increased fascial restricitons, Decreased strength, Improper body mechanics, Impaired perceived functional ability, Decreased range of motion  Visit Diagnosis: Acute pain of left shoulder  Abnormal posture  Stiffness of left shoulder, not elsewhere classified  Muscle weakness  (generalized)     Problem List Patient Active Problem List   Diagnosis Date Noted   Inflammation of clitoris 06/17/2021   History of hemorrhoidectomy 03/30/2020   Carpal tunnel syndrome of right wrist 02/29/2020   History of fusion of cervical spine 01/31/2020   Cervical radiculopathy 01/31/2020   Peripheral nerve disease 01/31/2020   Melanocytic neoplasm of skin 12/07/2019   Nail abnormality 12/07/2019   Lumbosacral radiculitis 01/04/2019   Gastroesophageal reflux disease without esophagitis 07/12/2018   Globus pharyngeus 07/12/2018   Somatic complaints, multiple 08/14/2016   Headache 08/12/2016   Upper airway cough syndrome 11/10/2014   Chest pain 05/19/2014   Hyperlipidemia 11/03/2013   Abdominal pain 12/12/2012   Premature ventricular contraction 07/11/2011   Other neutropenia ([place] 07/31/2010   THYROID NODULE 05/04/2009    ERennie NatterPT, DPT 06/18/2021, 12:05 PM  [facility] [address] [place] [address]Phone: [phone]  Fax:  [phone] Name: Mariah ORAVECMRN: [id number] of Birth: [birth year]

## 2021-06-20 ENCOUNTER — Encounter: Payer: Self-pay | Admitting: Physical Therapy

## 2021-06-20 ENCOUNTER — Other Ambulatory Visit: Payer: Self-pay

## 2021-06-20 ENCOUNTER — Ambulatory Visit: Payer: 59 | Admitting: Physical Therapy

## 2021-06-20 DIAGNOSIS — M25512 Pain in left shoulder: Secondary | ICD-10-CM

## 2021-06-20 DIAGNOSIS — R293 Abnormal posture: Secondary | ICD-10-CM

## 2021-06-20 DIAGNOSIS — M6281 Muscle weakness (generalized): Secondary | ICD-10-CM

## 2021-06-20 DIAGNOSIS — M25612 Stiffness of left shoulder, not elsewhere classified: Secondary | ICD-10-CM

## 2021-06-20 NOTE — Therapy (Signed)
[facility] [address]  [place], [place], [address] Phone: [phone]   Fax:  [phone]  Physical Therapy Treatment  Patient Details  Name: Mariah Everett MRN: [id number] Date of Birth: 1956/03/02 Referring Provider (PT): Sydnee Cabal, MD   Encounter Date: 06/20/2021   PT End of Session - 06/20/21 1319     Visit Number 5    Number of Visits 16    Date for PT Re-Evaluation 08/01/21    Authorization Type [employer]    PT Start Time 8891    PT Stop Time 1401    PT Time Calculation (min) 45 min    Activity Tolerance Patient tolerated treatment well    Behavior During Therapy [facility] for tasks assessed/performed             Past Medical History:  Diagnosis Date   CERUMEN IMPACTION, BILATERAL 09/03/2009   Qualifier: Diagnosis of  By: Royal Piedra NP, Tammy     CERVICAL RADICULOPATHY 12/15/2008   - MRI 12/22/2008 Pos C7 disc protrusion > Discectomy and fusion 05/18/2009 [facility] [place]      Chronic benign neutropenia ([place])    Esophageal dysmotility    Loss of weight 08/12/2016   Neck pain    radicular C7/8 right. MRI 12-22-18 pos C7 disc protrusion   Preventative health care 11/03/2013   Followed as Primary Care Patient/ [facility]/ [facility]     Rectal bleeding 07/11/2011   Negative colonoscopy 09/12/11  Dr Genelle Gather    Sciatica of left side 11/10/2014   Followed as Primary Care Patient/ [facility]/ [facility] > referred  Back to [facility]     Shoulder pain    Thyroid nodule    a. Bx/TFTs normal by prior workup, clinically benign.    Past Surgical History:  Procedure Laterality Date   BIOPSY THYROID     CERVICAL DISCECTOMY  05-18-2009   CERVICAL FUSION  05-18-2009   HEMORROIDECTOMY     1990   LEFT HEART CATHETERIZATION WITH CORONARY ANGIOGRAM N/A 05/19/2014   Procedure: LEFT HEART CATHETERIZATION WITH CORONARY ANGIOGRAM;  Surgeon: Jolaine Artist, MD;  Location: [facility] CATH LAB;  Service:  Cardiovascular;  Laterality: N/A;    There were no vitals filed for this visit.   Subjective Assessment - 06/20/21 1317     Subjective Patient reports she has been "pushing it" at home, almost to the point she forgets she has pain until tries to raise arm to the side, but she doesn't do that very often.    Pertinent History ACDF    Limitations Lifting;House hold activities    Diagnostic tests MRI: arthritis, bone spur, slight tear    Patient Stated Goals to improve shoulder mobility and decrease pain    Currently in Pain? Yes    Pain Score 2     Pain Location Shoulder    Pain Orientation Left    Pain Descriptors / Indicators Tightness    Pain Onset More than a month ago    Pain Frequency Intermittent                               [facility] Adult PT Treatment/Exercise - 06/20/21 0001       Exercises   Exercises Shoulder      Shoulder Exercises: Therapy Ball   Other Therapy Ball Exercises rolling small ball on wall CW x 20, CCW x 20  Shoulder Exercises: ROM/Strengthening   UBE (Upper Arm Bike) level 1 3 min f/3 min b    Wall Wash scaption x 20    Wall Pushups 20 reps      Shoulder Exercises: Stretch   Other Shoulder Stretches thoracic extension stretch over foam roller x 10    Other Shoulder Stretches pec stretch on foam roller with abduction full ROM x 15      Manual Therapy   Manual Therapy Soft tissue mobilization;Passive ROM    Soft tissue mobilization IASTM with stainless steel tools to pectoralis and middle delt, TPR to subscap and lat    Passive ROM into ER and IR                      PT Short Term Goals - 06/18/21 1203       PT SHORT TERM GOAL #1   Title Pt will be I and compliant with initial HEP.    Baseline provided at eval    Time 3    Period Weeks    Status Achieved    Target Date 06/27/21      PT SHORT TERM GOAL #2   Title Pt will improve L shoulder PROM abduction to >/= 90.    Baseline 64 with guarding; met 120  deg PROM    Time 3    Period Weeks    Status Achieved    Target Date 06/27/21               PT Long Term Goals - 06/06/21 1432       PT LONG TERM GOAL #1   Title Pt will be independent with advanced HEP for maintenance and continued progress post physical therapy.    Time 8    Period Weeks    Status New    Target Date 08/01/21      PT LONG TERM GOAL #2   Title Pt will demonstrate L shoulder AROM WFL and </= 3/10 pain, as needed to complete ADLs.    Baseline see flowsheet    Time 8    Period Weeks    Status New    Target Date 08/01/21      PT LONG TERM GOAL #3   Title Pt will be able to comb and wash hair without difficulty and </= 3/10 pain.    Baseline see flowsheet    Time 8    Period Weeks    Status New    Target Date 08/01/21      PT LONG TERM GOAL #4   Title Pt will increase FOTO ability to at least 68% ability, in order to demonstrate meaningful change in perceived level of functional ability.    Baseline 58% ability    Time 8    Period Weeks    Status New    Target Date 08/01/21      PT LONG TERM GOAL #5   Title Pt will be able to reach overhead, in order to perform IADLs like putting dishes into cabinet.    Baseline does not use L UE currently    Time 8    Period Weeks    Status New    Target Date 08/01/21                   Plan - 06/20/21 1509     Clinical Impression Statement Patient continues to make good progress with AROM, noted improved L shoulder abduction, mostly limited now with  ER.  To continue current HEP at home.  Also addressed thoracic mobility today with thoracic extension stretches over foam roller.  Manual therapy to L shoulder focusing again on pectoralis, delts, and subscap to improve ROM.  Pt. declined modalities at end.  Pt. would benefit from continued skilled therapy.    Personal Factors and Comorbidities Comorbidity 2;Time since onset of injury/illness/exacerbation    Comorbidities Thyroid disease, nerve/muscle  disease    Examination-Activity Limitations Bathing;Dressing;Sleep;Hygiene/Grooming;Reach Overhead;Lift    Examination-Participation Restrictions Laundry;Cleaning    Stability/Clinical Decision Making Stable/Uncomplicated    Rehab Potential Good    PT Frequency --   1-2x/week   PT Duration 8 weeks    PT Treatment/Interventions ADLs/Self Care Home Management;Cryotherapy;Electrical Stimulation;Iontophoresis 53m/ml Dexamethasone;Moist Heat;Functional mobility training;Therapeutic activities;Therapeutic exercise;Neuromuscular re-education;Patient/family education;Manual techniques;Passive range of motion;Dry needling;Taping;Vasopneumatic Device;Spinal Manipulations;Joint Manipulations    PT Next Visit Plan continue to progress shoulder strengthening exercises and ROM, modalities and manual therapy as indicated    PT Home Exercise Plan [email or web address](updated 7/21)    Consulted and Agree with Plan of Care Patient             Patient will benefit from skilled therapeutic intervention in order to improve the following deficits and impairments:  Pain, Postural dysfunction, Impaired UE functional use, Impaired flexibility, Increased fascial restricitons, Decreased strength, Improper body mechanics, Impaired perceived functional ability, Decreased range of motion  Visit Diagnosis: Acute pain of left shoulder  Abnormal posture  Stiffness of left shoulder, not elsewhere classified  Muscle weakness (generalized)     Problem List Patient Active Problem List   Diagnosis Date Noted   Inflammation of clitoris 06/17/2021   History of hemorrhoidectomy 03/30/2020   Carpal tunnel syndrome of right wrist 02/29/2020   History of fusion of cervical spine 01/31/2020   Cervical radiculopathy 01/31/2020   Peripheral nerve disease 01/31/2020   Melanocytic neoplasm of skin 12/07/2019   Nail abnormality 12/07/2019   Lumbosacral radiculitis 01/04/2019   Gastroesophageal reflux disease without esophagitis  07/12/2018   Globus pharyngeus 07/12/2018   Somatic complaints, multiple 08/14/2016   Headache 08/12/2016   Upper airway cough syndrome 11/10/2014   Chest pain 05/19/2014   Hyperlipidemia 11/03/2013   Abdominal pain 12/12/2012   Premature ventricular contraction 07/11/2011   [place] 07/31/2010   THYROID NODULE 05/04/2009    ERennie NatterPT, DPT 06/20/2021, 3:12 PM  [facility] [address] [place] [address]Phone: [phone]  Fax:  [phone] Name: Mariah CORROWMRN: [id number] of Birth: 201/23/1957

## 2021-06-25 ENCOUNTER — Encounter: Payer: Self-pay | Admitting: Physical Therapy

## 2021-06-25 ENCOUNTER — Ambulatory Visit: Payer: 59 | Attending: Specialist | Admitting: Physical Therapy

## 2021-06-25 ENCOUNTER — Other Ambulatory Visit: Payer: Self-pay

## 2021-06-25 DIAGNOSIS — M6281 Muscle weakness (generalized): Secondary | ICD-10-CM | POA: Diagnosis not present

## 2021-06-25 DIAGNOSIS — M25512 Pain in left shoulder: Secondary | ICD-10-CM | POA: Diagnosis not present

## 2021-06-25 DIAGNOSIS — R293 Abnormal posture: Secondary | ICD-10-CM | POA: Insufficient documentation

## 2021-06-25 DIAGNOSIS — M25612 Stiffness of left shoulder, not elsewhere classified: Secondary | ICD-10-CM | POA: Diagnosis not present

## 2021-06-25 NOTE — Patient Instructions (Signed)
Access Code: [email or web address] URL: [email or web address] Date: 06/25/2021 Prepared by: Glenetta Hew  Exercises Supine Shoulder Flexion AAROM - 2 x daily - 7 x weekly - 1-2 sets - 10 reps Doorway Pec Stretch at 60 Degrees Abduction with Arm Straight - 3 x daily - 7 x weekly - 3 sets - 20-30 seconds hold Corner Pec Minor Stretch - 3 x daily - 7 x weekly - 3 sets - 10 reps - 20-30 seconds hold Seated Scapular Retraction - 2 x daily - 7 x weekly - 3 sets - 10 reps Shoulder External Rotation with Anchored Resistance - 1 x daily - 7 x weekly - 2 sets - 10 reps Shoulder Internal Rotation with Resistance - 1 x daily - 7 x weekly - 2 sets - 10 reps Shoulder Extension with Resistance - 1 x daily - 7 x weekly - 2 sets - 10 reps Scapular Retraction with Resistance - 1 x daily - 7 x weekly - 2 sets - 10 reps Wall Push Up - 1 x daily - 7 x weekly - 2 sets - 10 reps Scaption Wall Slide with Towel - 1 x daily - 7 x weekly - 2 sets - 10 reps Sleeper Stretch - 1 x daily - 7 x weekly - 1 sets - 10 reps - 10 hold Standing Shoulder External Rotation Stretch in Doorway - 2 x daily - 7 x weekly - 1 sets - 5 reps - 30 sec hold Standing Shoulder Posterior Capsule Stretch - 2 x daily - 7 x weekly - 1 sets - 5 reps - 30 hold Seated Levator Scapulae Stretch - 1 x daily - 7 x weekly - 1sets - 3 reps x 15 sec hold *  Added to HEP

## 2021-06-25 NOTE — Therapy (Addendum)
PHYSICAL THERAPY DISCHARGE SUMMARY (08/14/21)  Visits from Start of Care: 6  Current functional level related to goals / functional outcomes: See treatment note below.  Patient had been making good progress towards all goals with decreased pain and improved pain free ROM.  Patient did not schedule more visits and has not returned in > 1 month.    Remaining deficits: See last treatment note.    Education / Equipment: HEP  Plan: Patient is being discharged due to not returning to therapy in > 1 month.       [facility] [address]  [place], [place], [address] Phone: [phone]   Fax:  [phone]  Physical Therapy Treatment  Patient Details  Name: Mariah Everett MRN: [id number] Date of Birth: 1956-02-06 Referring Provider (PT): Sydnee Cabal, MD   Encounter Date: 06/25/2021   PT End of Session - 06/25/21 0940     Visit Number 6    Number of Visits 16    Date for PT Re-Evaluation 08/01/21    Authorization Type [employer]    PT Start Time [phone]    PT Stop Time 0930    PT Time Calculation (min) 44 min    Activity Tolerance Patient tolerated treatment well    Behavior During Therapy [facility] for tasks assessed/performed             Past Medical History:  Diagnosis Date   CERUMEN IMPACTION, BILATERAL 09/03/2009   Qualifier: Diagnosis of  By: Royal Piedra NP, Tammy     CERVICAL RADICULOPATHY 12/15/2008   - MRI 12/22/2008 Pos C7 disc protrusion > Discectomy and fusion 05/18/2009 [facility] [place]      Chronic benign neutropenia ([place])    Esophageal dysmotility    Loss of weight 08/12/2016   Neck pain    radicular C7/8 right. MRI 12-22-18 pos C7 disc protrusion   Preventative health care 11/03/2013   Followed as Primary Care Patient/ [facility]/ [facility]     Rectal bleeding 07/11/2011   Negative colonoscopy 09/12/11  Dr Genelle Gather    Sciatica of left side 11/10/2014   Followed as Primary Care Patient/  [facility]/ [facility] > referred  Back to [facility]     Shoulder pain    Thyroid nodule    a. Bx/TFTs normal by prior workup, clinically benign.    Past Surgical History:  Procedure Laterality Date   BIOPSY THYROID     CERVICAL DISCECTOMY  05-18-2009   CERVICAL FUSION  05-18-2009   HEMORROIDECTOMY     1990   LEFT HEART CATHETERIZATION WITH CORONARY ANGIOGRAM N/A 05/19/2014   Procedure: LEFT HEART CATHETERIZATION WITH CORONARY ANGIOGRAM;  Surgeon: Jolaine Artist, MD;  Location: [facility] CATH LAB;  Service: Cardiovascular;  Laterality: N/A;    There were no vitals filed for this visit.   Subjective Assessment - 06/25/21 0851     Subjective Patient reports shoulder is "ok" and no pain.   Doorway stretch still difficult.    Pertinent History ACDF    Limitations Lifting;House hold activities    Diagnostic tests MRI: arthritis, bone spur, slight tear    Patient Stated Goals to improve shoulder mobility and decrease pain    Currently in Pain? No/denies    Pain Location Shoulder    Pain Orientation Left  [facility] Adult PT Treatment/Exercise - 06/25/21 0001       Exercises   Exercises Shoulder      Shoulder Exercises: ROM/Strengthening   UBE (Upper Arm Bike) level 1 3 min f/3 min b    Wall Wash flexion x 10, scaption x 10, abduction x 10    Other ROM/Strengthening Exercises wall angels x 10, second set after manual therapy noted improved ROM      Shoulder Exercises: Stretch   Other Shoulder Stretches LS stretch in sitting, pect stretch in door      Manual Therapy   Manual Therapy Joint mobilization;Soft tissue mobilization;Passive ROM    Manual therapy comments L shoulder in sitting and supine    Joint Mobilization capsule stretch inferior and posterio    Soft tissue mobilization Pin and stretch to L levator scapulae, infraspinatus, and subscap.  IASTM to posterior delt.    Passive ROM into ER                    PT  Education - 06/25/21 0936     Education Details added Levator scapulae stretch and reviewed shoulder ER stretch.  Access Code: [email or web address]    Person(s) Educated Patient    Methods Explanation;Demonstration;Verbal cues;Handout    Comprehension Verbalized understanding;Returned demonstration              PT Short Term Goals - 06/18/21 1203       PT SHORT TERM GOAL #1   Title Pt will be I and compliant with initial HEP.    Baseline provided at eval    Time 3    Period Weeks    Status Achieved    Target Date 06/27/21      PT SHORT TERM GOAL #2   Title Pt will improve L shoulder PROM abduction to >/= 90.    Baseline 64 with guarding; met 120 deg PROM    Time 3    Period Weeks    Status Achieved    Target Date 06/27/21               PT Long Term Goals - 06/25/21 0944       PT LONG TERM GOAL #1   Title Pt will be independent with advanced HEP for maintenance and continued progress post physical therapy.    Time 8    Period Weeks    Status Achieved      PT LONG TERM GOAL #2   Title Pt will demonstrate L shoulder AROM WFL and </= 3/10 pain, as needed to complete ADLs.    Baseline see flowsheet    Time 8    Period Weeks    Status New      PT LONG TERM GOAL #3   Title Pt will be able to comb and wash hair without difficulty and </= 3/10 pain.    Baseline see flowsheet    Time 8    Period Weeks    Status Achieved      PT LONG TERM GOAL #4   Title Pt will increase FOTO ability to at least 68% ability, in order to demonstrate meaningful change in perceived level of functional ability.    Baseline 58% ability    Time 8    Period Weeks    Status New      PT LONG TERM GOAL #5   Title Pt will be able to reach overhead, in order to perform IADLs like putting dishes into cabinet.  Baseline does not use L UE currently    Time 8    Period Weeks    Status New                   Plan - 06/25/21 0941     Clinical Impression Statement Patient continues to  make good progress, reporting no shoulder pain today.  She still has tightness end range with wall angels, but after releases to subscap, infraspinatus, levator scapulae and pectoralis minor, was able to perform wall angels with less restriction.   She is still very guarded with PROM into ER, but this is also improving, and she now has full functional ROM in L shoulder.  She would benefit from continued skilled therapy to regain full L shoulder ROM.    Personal Factors and Comorbidities Comorbidity 2;Time since onset of injury/illness/exacerbation    Comorbidities Thyroid disease, nerve/muscle disease    Examination-Activity Limitations Bathing;Dressing;Sleep;Hygiene/Grooming;Reach Overhead;Lift    Examination-Participation Restrictions Laundry;Cleaning    Stability/Clinical Decision Making Stable/Uncomplicated    Rehab Potential Good    PT Frequency --   1-2x/week   PT Duration 8 weeks    PT Treatment/Interventions ADLs/Self Care Home Management;Cryotherapy;Electrical Stimulation;Iontophoresis 80m/ml Dexamethasone;Moist Heat;Functional mobility training;Therapeutic activities;Therapeutic exercise;Neuromuscular re-education;Patient/family education;Manual techniques;Passive range of motion;Dry needling;Taping;Vasopneumatic Device;Spinal Manipulations;Joint Manipulations    PT Next Visit Plan continue to progress shoulder strengthening exercises and ROM, modalities and manual therapy as indicated    PT Home Exercise Plan [email or web address](updated 06/25/21)    Consulted and Agree with Plan of Care Patient             Patient will benefit from skilled therapeutic intervention in order to improve the following deficits and impairments:  Pain, Postural dysfunction, Impaired UE functional use, Impaired flexibility, Increased fascial restricitons, Decreased strength, Improper body mechanics, Impaired perceived functional ability, Decreased range of motion  Visit Diagnosis: Acute pain of left  shoulder  Abnormal posture  Stiffness of left shoulder, not elsewhere classified  Muscle weakness (generalized)     Problem List Patient Active Problem List   Diagnosis Date Noted   Inflammation of clitoris 06/17/2021   History of hemorrhoidectomy 03/30/2020   Carpal tunnel syndrome of right wrist 02/29/2020   History of fusion of cervical spine 01/31/2020   Cervical radiculopathy 01/31/2020   Peripheral nerve disease 01/31/2020   Melanocytic neoplasm of skin 12/07/2019   Nail abnormality 12/07/2019   Lumbosacral radiculitis 01/04/2019   Gastroesophageal reflux disease without esophagitis 07/12/2018   Globus pharyngeus 07/12/2018   Somatic complaints, multiple 08/14/2016   Headache 08/12/2016   Upper airway cough syndrome 11/10/2014   Chest pain 05/19/2014   Hyperlipidemia 11/03/2013   Abdominal pain 12/12/2012   Premature ventricular contraction 07/11/2011   Other neutropenia ([place] 07/31/2010   THYROID NODULE 05/04/2009    ERennie NatterPT, DPT 06/25/2021, 9:45 AM  [facility] [place] [address]Phone: [phone]  Fax:  [phone] Name: Mariah TEMPLEMANMRN: [id number] of Birth: [birth year]

## 2021-07-08 DIAGNOSIS — M19012 Primary osteoarthritis, left shoulder: Secondary | ICD-10-CM | POA: Diagnosis not present

## 2021-07-08 DIAGNOSIS — M7542 Impingement syndrome of left shoulder: Secondary | ICD-10-CM | POA: Diagnosis not present

## 2021-07-11 DIAGNOSIS — K219 Gastro-esophageal reflux disease without esophagitis: Secondary | ICD-10-CM | POA: Diagnosis not present

## 2021-07-11 DIAGNOSIS — Z8371 Family history of colonic polyps: Secondary | ICD-10-CM | POA: Diagnosis not present

## 2021-08-23 ENCOUNTER — Other Ambulatory Visit (HOSPITAL_BASED_OUTPATIENT_CLINIC_OR_DEPARTMENT_OTHER): Payer: Self-pay

## 2021-08-23 DIAGNOSIS — K2950 Unspecified chronic gastritis without bleeding: Secondary | ICD-10-CM | POA: Diagnosis not present

## 2021-08-23 DIAGNOSIS — K219 Gastro-esophageal reflux disease without esophagitis: Secondary | ICD-10-CM | POA: Diagnosis not present

## 2021-08-23 DIAGNOSIS — K3189 Other diseases of stomach and duodenum: Secondary | ICD-10-CM | POA: Diagnosis not present

## 2021-08-23 MED ORDER — OMEPRAZOLE 40 MG PO CPDR
DELAYED_RELEASE_CAPSULE | ORAL | 3 refills | Status: DC
  Filled 2021-08-23: qty 31, 31d supply, fill #0

## 2021-08-27 ENCOUNTER — Ambulatory Visit: Payer: 59 | Attending: Internal Medicine

## 2021-08-27 DIAGNOSIS — Z23 Encounter for immunization: Secondary | ICD-10-CM

## 2021-08-27 NOTE — Progress Notes (Signed)
   Covid-19 Vaccination Clinic  Name:  EDWENA MAYORGA    MRN: [id number] DOB: 07-Aug-1956  08/27/2021  Ms. Ramone was observed post Covid-19 immunization for 15 minutes without incident. She was provided with Vaccine Information Sheet and instruction to access the V-Safe system.   Ms. Charpentier was instructed to call 911 with any severe reactions post vaccine: Difficulty breathing  Swelling of face and throat  A fast heartbeat  A bad rash all over body  Dizziness and weakness

## 2021-09-03 ENCOUNTER — Other Ambulatory Visit (HOSPITAL_BASED_OUTPATIENT_CLINIC_OR_DEPARTMENT_OTHER): Payer: Self-pay

## 2021-09-03 MED ORDER — OMEPRAZOLE 20 MG PO CPDR
DELAYED_RELEASE_CAPSULE | ORAL | 2 refills | Status: DC
  Filled 2021-09-03: qty 30, 30d supply, fill #0

## 2021-09-04 ENCOUNTER — Encounter: Payer: Self-pay | Admitting: Family Medicine

## 2021-09-04 ENCOUNTER — Other Ambulatory Visit (HOSPITAL_BASED_OUTPATIENT_CLINIC_OR_DEPARTMENT_OTHER): Payer: Self-pay

## 2021-09-04 ENCOUNTER — Ambulatory Visit (INDEPENDENT_AMBULATORY_CARE_PROVIDER_SITE_OTHER): Payer: 59 | Admitting: Family Medicine

## 2021-09-04 DIAGNOSIS — R1013 Epigastric pain: Secondary | ICD-10-CM | POA: Diagnosis not present

## 2021-09-04 NOTE — Progress Notes (Signed)
Mariah Everett - 65 y.o. female MRN [id number]  Date of birth: 05-Sep-1956  Subjective Chief Complaint  Patient presents with   GI Problem    HPI Mariah Everett is a 65 year old female here today for initial visit with me.  She is transferring care from Dr. Sheppard Coil.  She had recent EGD due to recurrent epigastric pain.  She was started on omeprazole initially at 40 mg daily however this has been reduced back down to 20 mg.  She reports that symptoms have improved with this.  She denies nausea, vomiting or diarrhea.  She has upcoming annual exam with me next month.  ROS:  A comprehensive ROS was completed and negative except as noted per HPI  No Known Allergies  Past Medical History:  Diagnosis Date   CERUMEN IMPACTION, BILATERAL 09/03/2009   Qualifier: Diagnosis of  By: Royal Piedra NP, Tammy     CERVICAL RADICULOPATHY 12/15/2008   - MRI 12/22/2008 Pos C7 disc protrusion > Discectomy and fusion 05/18/2009 [facility] [place]      Chronic benign neutropenia (Mariah Everett)    Esophageal dysmotility    Loss of weight 08/12/2016   Neck pain    radicular C7/8 right. MRI 12-22-18 pos C7 disc protrusion   Preventative health care 11/03/2013   Followed as Primary Care Patient/ [facility]/ Mariah Everett     Rectal bleeding 07/11/2011   Negative colonoscopy 09/12/11  Dr Genelle Gather    Sciatica of left side 11/10/2014   Followed as Primary Care Patient/ [facility]/ Mariah Everett > referred  Back to [facility]     Shoulder pain    Thyroid nodule    a. Bx/TFTs normal by prior workup, clinically benign.    Past Surgical History:  Procedure Laterality Date   BIOPSY THYROID     CERVICAL DISCECTOMY  05-18-2009   CERVICAL FUSION  05-18-2009   HEMORROIDECTOMY     1990   LEFT HEART CATHETERIZATION WITH CORONARY ANGIOGRAM N/A 05/19/2014   Procedure: LEFT HEART CATHETERIZATION WITH CORONARY ANGIOGRAM;  Surgeon: Jolaine Artist, MD;  Location[facility] CATH LAB;  Service: Cardiovascular;  Laterality: N/A;    Social History    Socioeconomic History   Marital status: Married    Spouse name: Nida Manfredi   Number of children: 1   Years of education: Not on file   Highest education level: Some college, no degree  Occupational History   Occupation: insurance   Occupation: Patient Insurance account manager: [employer]  Tobacco Use   Smoking status: Never   Smokeless tobacco: Never  Vaping Use   Vaping Use: Never used  Substance and Sexual Activity   Alcohol use: Not Currently    Comment: occ   Drug use: No   Sexual activity: Yes    Partners: Male    Birth control/protection: None, Post-menopausal  Other Topics Concern   Not on file  Social History Narrative   Not on file   Social Determinants of Health   Financial Resource Strain: Not on file  Food Insecurity: Not on file  Transportation Needs: Not on file  Physical Activity: Not on file  Stress: Not on file  Social Connections: Not on file    Family History  Problem Relation Age of Onset   Ovarian cancer Mother    Lung cancer Mother    Cervical cancer Mother    High blood pressure Mother    Stroke Mother    Throat cancer Father    Lupus Sister  High blood pressure Sister    Diabetes Son    High blood pressure Other     Health Maintenance  Topic Date Due   COVID-19 Vaccine (5 - Booster for Pfizer series) 07/13/2021   INFLUENZA VACCINE  02/21/2022 (Originally 06/24/2021)   MAMMOGRAM  10/16/2022   TETANUS/TDAP  01/24/2029   COLONOSCOPY (Pts 45-13yrs Insurance coverage will need to be confirmed)  03/12/2030   DEXA SCAN  Completed   Hepatitis C Screening  Completed   Zoster Vaccines- Shingrix  Completed   HPV VACCINES  Aged Out     ----------------------------------------------------------------------------------------------------------------------------------------------------------------------------------------------------------------- Physical Exam BP 121/83 (BP Location: Left Arm, Patient Position:  Sitting, Cuff Size: Small)   Pulse 79   Temp (!) 97.5 F (36.4 C)   Ht 5\' 5"  (1.651 m)   Wt 131 lb 4.8 oz (59.6 kg)   SpO2 100%   BMI 21.85 kg/m   Physical Exam Constitutional:      Appearance: Normal appearance.  Cardiovascular:     Rate and Rhythm: Normal rate and regular rhythm.  Pulmonary:     Effort: Pulmonary effort is normal.     Breath sounds: Normal breath sounds.  Musculoskeletal:     Cervical back: Neck supple.  Neurological:     Mental Status: She is alert.  Psychiatric:        Mood and Affect: Mood normal.        Behavior: Behavior normal.    ------------------------------------------------------------------------------------------------------------------------------------------------------------------------------------------------------------------- Assessment and Plan  Abdominal pain Recent endoscopy without significant findings.  She has had improvement with PPI.  She will continue this.   No orders of the defined types were placed in this encounter.   No follow-ups on file.    This visit occurred during the SARS-CoV-2 public health emergency.  Safety protocols were in place, including screening questions prior to the visit, additional usage of staff PPE, and extensive cleaning of exam room while observing appropriate contact time as indicated for disinfecting solutions.

## 2021-09-04 NOTE — Assessment & Plan Note (Signed)
Recent endoscopy without significant findings.  She has had improvement with PPI.  She will continue this.

## 2021-09-04 NOTE — Patient Instructions (Addendum)
Great to meet you today!    

## 2021-09-06 ENCOUNTER — Other Ambulatory Visit (HOSPITAL_BASED_OUTPATIENT_CLINIC_OR_DEPARTMENT_OTHER): Payer: Self-pay

## 2021-09-06 MED ORDER — COVID-19MRNA BIVAL VACC PFIZER 30 MCG/0.3ML IM SUSP
INTRAMUSCULAR | 0 refills | Status: DC
  Filled 2021-09-06: qty 0.3, 1d supply, fill #0

## 2021-09-06 MED ORDER — OMEPRAZOLE 20 MG PO CPDR
DELAYED_RELEASE_CAPSULE | ORAL | 6 refills | Status: DC
  Filled 2021-09-06: qty 30, 30d supply, fill #0
  Filled 2021-11-07: qty 30, 30d supply, fill #1

## 2021-09-17 ENCOUNTER — Other Ambulatory Visit (HOSPITAL_BASED_OUTPATIENT_CLINIC_OR_DEPARTMENT_OTHER): Payer: Self-pay

## 2021-09-17 MED ORDER — INFLUENZA VAC A&B SA ADJ QUAD 0.5 ML IM PRSY
PREFILLED_SYRINGE | INTRAMUSCULAR | 0 refills | Status: DC
  Filled 2021-09-17: qty 0.5, 1d supply, fill #0

## 2021-09-18 ENCOUNTER — Other Ambulatory Visit (HOSPITAL_BASED_OUTPATIENT_CLINIC_OR_DEPARTMENT_OTHER): Payer: Self-pay

## 2021-09-30 ENCOUNTER — Other Ambulatory Visit (HOSPITAL_BASED_OUTPATIENT_CLINIC_OR_DEPARTMENT_OTHER): Payer: Self-pay

## 2021-09-30 DIAGNOSIS — N898 Other specified noninflammatory disorders of vagina: Secondary | ICD-10-CM | POA: Diagnosis not present

## 2021-09-30 MED ORDER — NYSTATIN-TRIAMCINOLONE 100000-0.1 UNIT/GM-% EX OINT
TOPICAL_OINTMENT | CUTANEOUS | 1 refills | Status: DC
  Filled 2021-09-30: qty 30, 15d supply, fill #0

## 2021-10-04 ENCOUNTER — Telehealth: Payer: Self-pay

## 2021-10-04 ENCOUNTER — Ambulatory Visit (INDEPENDENT_AMBULATORY_CARE_PROVIDER_SITE_OTHER): Payer: 59 | Admitting: Family Medicine

## 2021-10-04 ENCOUNTER — Encounter: Payer: Self-pay | Admitting: Family Medicine

## 2021-10-04 ENCOUNTER — Other Ambulatory Visit: Payer: Self-pay

## 2021-10-04 VITALS — BP 127/84 | HR 92 | Temp 98.3°F | Ht 65.0 in | Wt 130.0 lb

## 2021-10-04 DIAGNOSIS — E041 Nontoxic single thyroid nodule: Secondary | ICD-10-CM

## 2021-10-04 DIAGNOSIS — Z780 Asymptomatic menopausal state: Secondary | ICD-10-CM

## 2021-10-04 DIAGNOSIS — E782 Mixed hyperlipidemia: Secondary | ICD-10-CM | POA: Diagnosis not present

## 2021-10-04 DIAGNOSIS — Z23 Encounter for immunization: Secondary | ICD-10-CM

## 2021-10-04 DIAGNOSIS — Z0000 Encounter for general adult medical examination without abnormal findings: Secondary | ICD-10-CM

## 2021-10-04 NOTE — Progress Notes (Signed)
Mariah Everett - 65 y.o. female MRN [id number]  Date of birth: 1956/08/10  Subjective Chief Complaint  Patient presents with   Annual Exam    HPI Mariah Everett is a 65 year old female here today for annual exam.  She reports she is doing well at this time.  She is due for updated labs today.    She has a non-smoker.  She rarely consumes alcohol.  She does exercise a few days during the week.  She feels like her diet is pretty healthy.  She would like to have pneumonia vaccine today.     Review of Systems  Constitutional:  Negative for chills, fever, malaise/fatigue and weight loss.  HENT:  Negative for congestion, ear pain and sore throat.   Eyes:  Negative for blurred vision, double vision and pain.  Respiratory:  Negative for cough and shortness of breath.   Cardiovascular:  Negative for chest pain and palpitations.  Gastrointestinal:  Negative for abdominal pain, blood in stool, constipation, heartburn and nausea.  Genitourinary:  Negative for dysuria and urgency.  Musculoskeletal:  Negative for joint pain and myalgias.  Neurological:  Negative for dizziness and headaches.  Endo/Heme/Allergies:  Does not bruise/bleed easily.  Psychiatric/Behavioral:  Negative for depression. The patient is not nervous/anxious and does not have insomnia.     No Known Allergies  Past Medical History:  Diagnosis Date   CERUMEN IMPACTION, BILATERAL 09/03/2009   Qualifier: Diagnosis of  By: Royal Piedra NP, Tammy     CERVICAL RADICULOPATHY 12/15/2008   - MRI 12/22/2008 Pos C7 disc protrusion > Discectomy and fusion [facility]/18/2009 [facility] [place]      Chronic benign neutropenia ([place])    Esophageal dysmotility    Loss of weight 08/12/2016   Neck pain    radicular C7/8 right. MRI 12-22-18 pos C7 disc protrusion   Preventative health care 11/03/2013   Followed as Primary Care Patient/ [facility]/ [place]     Rectal bleeding 07/11/2011   Negative colonoscopy 09/12/11  Dr Genelle Gather    Sciatica of left side  11/10/2014   Followed as Primary Care Patient/ [facility]/ [place] > referred  Back to [facility]     Shoulder pain    Thyroid nodule    a. Bx/TFTs normal by prior workup, clinically benign.    Past Surgical History:  Procedure Laterality Date   BIOPSY THYROID     CERVICAL DISCECTOMY  05-18-2009   CERVICAL FUSION  05-18-2009   HEMORROIDECTOMY     1990   LEFT HEART CATHETERIZATION WITH CORONARY ANGIOGRAM N/A 05/19/2014   Procedure: LEFT HEART CATHETERIZATION WITH CORONARY ANGIOGRAM;  Surgeon: Jolaine Artist, MD;  Location: [facility] CATH LAB;  Service: Cardiovascular;  Laterality: N/A;    Social History   Socioeconomic History   Marital status: Married    Spouse name: Yaira Bernardi   Number of children: 1   Years of education: Not on file   Highest education level: Some college, no degree  Occupational History   Occupation: insurance   Occupation: Patient Insurance account manager: [employer]  Tobacco Use   Smoking status: Never   Smokeless tobacco: Never  Vaping Use   Vaping Use: Never used  Substance and Sexual Activity   Alcohol use: Not Currently    Comment: occ   Drug use: No   Sexual activity: Yes    Partners: Male    Birth control/protection: None, Post-menopausal  Other Topics Concern   Not on file  Social  History Narrative   Not on file   Social Determinants of Health   Financial Resource Strain: Not on file  Food Insecurity: Not on file  Transportation Needs: Not on file  Physical Activity: Not on file  Stress: Not on file  Social Connections: Not on file    Family History  Problem Relation Age of Onset   Ovarian cancer Mother    Lung cancer Mother    Cervical cancer Mother    High blood pressure Mother    Stroke Mother    Throat cancer Father    Lupus Sister    High blood pressure Sister    Diabetes Son    High blood pressure Other     Health Maintenance  Topic Date Due   Pneumonia Vaccine 38+ Years old (2 - PCV)  06/28/2020   MAMMOGRAM  10/16/2022   TETANUS/TDAP  01/24/2029   COLONOSCOPY (Pts 45-78yrs Insurance coverage will need to be confirmed)  03/12/2030   INFLUENZA VACCINE  Completed   DEXA SCAN  Completed   COVID-19 Vaccine  Completed   Hepatitis C Screening  Completed   Zoster Vaccines- Shingrix  Completed   HPV VACCINES  Aged Out     ----------------------------------------------------------------------------------------------------------------------------------------------------------------------------------------------------------------- Physical Exam BP 127/84 (BP Location: Left Arm, Patient Position: Sitting, Cuff Size: Small)   Pulse 92   Temp 98.3 F (36.8 C)   Ht 5\' 5"  (1.651 m)   Wt 130 lb (59 kg)   SpO2 100%   BMI 21.63 kg/m   Physical Exam Constitutional:      General: She is not in acute distress. HENT:     Head: Normocephalic and atraumatic.     Right Ear: Tympanic membrane and ear canal normal.     Left Ear: Tympanic membrane and ear canal normal.     Nose: Nose normal.  Eyes:     General: No scleral icterus.    Conjunctiva/sclera: Conjunctivae normal.  Neck:     Thyroid: No thyromegaly.  Cardiovascular:     Rate and Rhythm: Normal rate and regular rhythm.     Heart sounds: Normal heart sounds.  Pulmonary:     Effort: Pulmonary effort is normal.     Breath sounds: Normal breath sounds.  Abdominal:     General: Bowel sounds are normal. There is no distension.     Palpations: Abdomen is soft.     Tenderness: There is no abdominal tenderness. There is no guarding.  Musculoskeletal:        General: Normal range of motion.     Cervical back: Normal range of motion and neck supple.  Lymphadenopathy:     Cervical: No cervical adenopathy.  Skin:    General: Skin is warm and dry.     Findings: No rash.  Neurological:     General: No focal deficit present.     Mental Status: She is alert and oriented to person, place, and time.     Cranial Nerves: No  cranial nerve deficit.     Coordination: Coordination normal.  Psychiatric:        Mood and Affect: Mood normal.        Behavior: Behavior normal.    ------------------------------------------------------------------------------------------------------------------------------------------------------------------------------------------------------------------- Assessment and Plan  Well adult exam Well adult Orders Placed This Encounter  Procedures   Pneumococcal conjugate vaccine 20-valent (Prevnar 20)   COMPLETE METABOLIC PANEL WITH GFR   CBC with Differential   Lipid Panel w/reflex Direct LDL   TSH   Vitamin D (25 hydroxy)  Screening: Lipid panel.  Up-to-date on colon cancer screening. Immunizations: Prevnar 20 Anticipatory guidance/risk factor reduction: Recommendations per AVS.   No orders of the defined types were placed in this encounter.   No follow-ups on file.    This visit occurred during the SARS-CoV-2 public health emergency.  Safety protocols were in place, including screening questions prior to the visit, additional usage of staff PPE, and extensive cleaning of exam room while observing appropriate contact time as indicated for disinfecting solutions.

## 2021-10-04 NOTE — Addendum Note (Signed)
Addended by: Perlie Mayo on: 10/04/2021 01:21 PM   Modules accepted: Orders

## 2021-10-04 NOTE — Telephone Encounter (Signed)
Should I contact Radiology concerning scheduling the previously ordered Bone Density scan or are you placing a new order?  Thanks

## 2021-10-04 NOTE — Patient Instructions (Signed)
Preventive Care 40 Years and Older, Female Preventive care refers to lifestyle choices and visits with your health care provider that can promote health and wellness. Preventive care visits are also called wellness exams. What can I expect for my preventive care visit? Counseling Your health care provider may ask you questions about your: Medical history, including: Past medical problems. Family medical history. Pregnancy and menstrual history. History of falls. Current health, including: Memory and ability to understand (cognition). Emotional well-being. Home life and relationship well-being. Sexual activity and sexual health. Lifestyle, including: Alcohol, nicotine or tobacco, and drug use. Access to firearms. Diet, exercise, and sleep habits. Work and work Statistician. Sunscreen use. Safety issues such as seatbelt and bike helmet use. Physical exam Your health care provider will check your: Height and weight. These may be used to calculate your BMI (body mass index). BMI is a measurement that tells if you are at a healthy weight. Waist circumference. This measures the distance around your waistline. This measurement also tells if you are at a healthy weight and may help predict your risk of certain diseases, such as type 2 diabetes and high blood pressure. Heart rate and blood pressure. Body temperature. Skin for abnormal spots. What immunizations do I need? Vaccines are usually given at various ages, according to a schedule. Your health care provider will recommend vaccines for you based on your age, medical history, and lifestyle or other factors, such as travel or where you work. What tests do I need? Screening Your health care provider may recommend screening tests for certain conditions. This may include: Lipid and cholesterol levels. Hepatitis C test. Hepatitis B test. HIV (human immunodeficiency virus) test. STI (sexually transmitted infection) testing, if you are at  risk. Lung cancer screening. Colorectal cancer screening. Diabetes screening. This is done by checking your blood sugar (glucose) after you have not eaten for a while (fasting). Mammogram. Talk with your health care provider about how often you should have regular mammograms. BRCA-related cancer screening. This may be done if you have a family history of breast, ovarian, tubal, or peritoneal cancers. Bone density scan. This is done to screen for osteoporosis. Talk with your health care provider about your test results, treatment options, and if necessary, the need for more tests. Follow these instructions at home: Eating and drinking  Eat a diet that includes fresh fruits and vegetables, whole grains, lean protein, and low-fat dairy products. Limit your intake of foods with high amounts of sugar, saturated fats, and salt. Take vitamin and mineral supplements as recommended by your health care provider. Do not drink alcohol if your health care provider tells you not to drink. If you drink alcohol: Limit how much you have to 0-1 drink a day. Know how much alcohol is in your drink. In the [place] one drink equals one 12 oz bottle of beer (355 mL), one 5 oz glass of wine (148 mL), or one 1 oz glass of hard liquor (44 mL). Lifestyle Brush your teeth every morning and night with fluoride toothpaste. Floss one time each day. Exercise for at least 30 minutes 5 or more days each week. Do not use any products that contain nicotine or tobacco. These products include cigarettes, chewing tobacco, and vaping devices, such as e-cigarettes. If you need help quitting, ask your health care provider. Do not use drugs. If you are sexually active, practice safe sex. Use a condom or other form of protection in order to prevent STIs. Take aspirin only as told by your  health care provider. Make sure that you understand how much to take and what form to take. Work with your health care provider to find out whether it  is safe and beneficial for you to take aspirin daily. Ask your health care provider if you need to take a cholesterol-lowering medicine (statin). Find healthy ways to manage stress, such as: Meditation, yoga, or listening to music. Journaling. Talking to a trusted person. Spending time with friends and family. Minimize exposure to UV radiation to reduce your risk of skin cancer. Safety Always wear your seat belt while driving or riding in a vehicle. Do not drive: If you have been drinking alcohol. Do not ride with someone who has been drinking. When you are tired or distracted. While texting. If you have been using any mind-altering substances or drugs. Wear a helmet and other protective equipment during sports activities. If you have firearms in your house, make sure you follow all gun safety procedures. What's next? Visit your health care provider once a year for an annual wellness visit. Ask your health care provider how often you should have your eyes and teeth checked. Stay up to date on all vaccines. This information is not intended to replace advice given to you by your health care provider. Make sure you discuss any questions you have with your health care provider. Document Revised: 05/08/2021 Document Reviewed: 05/08/2021 Elsevier Patient Education  [place].

## 2021-10-05 LAB — COMPLETE METABOLIC PANEL WITHOUT GFR
AG Ratio: 2.2 (calc) (ref 1.0–2.5)
ALT: 13 U/L (ref 6–29)
AST: 17 U/L (ref 10–35)
Albumin: 4.6 g/dL (ref 3.6–5.1)
Alkaline phosphatase (APISO): 43 U/L (ref 37–153)
BUN: 9 mg/dL (ref 7–25)
CO2: 29 mmol/L (ref 20–32)
Calcium: 9.8 mg/dL (ref 8.6–10.4)
Chloride: 106 mmol/L (ref 98–110)
Creat: 0.64 mg/dL (ref 0.50–1.05)
Globulin: 2.1 g/dL (ref 1.9–3.7)
Glucose, Bld: 87 mg/dL (ref 65–99)
Potassium: 4.8 mmol/L (ref 3.5–5.3)
Sodium: 142 mmol/L (ref 135–146)
Total Bilirubin: 0.8 mg/dL (ref 0.2–1.2)
Total Protein: 6.7 g/dL (ref 6.1–8.1)
eGFR: 97 mL/min/{1.73_m2}

## 2021-10-05 LAB — CBC WITH DIFFERENTIAL/PLATELET
Absolute Monocytes: 203 {cells}/uL (ref 200–950)
Basophils Absolute: 12 {cells}/uL (ref 0–200)
Basophils Relative: 0.4 %
Eosinophils Absolute: 52 {cells}/uL (ref 15–500)
Eosinophils Relative: 1.8 %
HCT: 45.2 % — ABNORMAL HIGH (ref 35.0–45.0)
Hemoglobin: 14.7 g/dL (ref 11.7–15.5)
Lymphs Abs: 2004 {cells}/uL (ref 850–3900)
MCH: 29.2 pg (ref 27.0–33.0)
MCHC: 32.5 g/dL (ref 32.0–36.0)
MCV: 89.7 fL (ref 80.0–100.0)
MPV: 11.2 fL (ref 7.5–12.5)
Monocytes Relative: 7 %
Neutro Abs: 629 {cells}/uL — ABNORMAL LOW (ref 1500–7800)
Neutrophils Relative %: 21.7 %
Platelets: 202 10*3/uL (ref 140–400)
RBC: 5.04 Million/uL (ref 3.80–5.10)
RDW: 11.8 % (ref 11.0–15.0)
Total Lymphocyte: 69.1 %
WBC: 2.9 10*3/uL — ABNORMAL LOW (ref 3.8–10.8)

## 2021-10-05 LAB — VITAMIN D 25 HYDROXY (VIT D DEFICIENCY, FRACTURES): Vit D, 25-Hydroxy: 49 ng/mL (ref 30–100)

## 2021-10-05 LAB — LIPID PANEL W/REFLEX DIRECT LDL
Cholesterol: 199 mg/dL (ref <200)
HDL: 83 mg/dL (ref >=50)
LDL Cholesterol (Calc): 102 mg/dL (calc) — ABNORMAL HIGH
Non-HDL Cholesterol (Calc): 116 mg/dL (calc) (ref <130)
Total CHOL/HDL Ratio: 2.4 (calc) (ref <5.0)
Triglycerides: 58 mg/dL (ref <150)

## 2021-10-05 LAB — TSH: TSH: 1.34 mIU/L (ref 0.40–4.50)

## 2021-10-06 DIAGNOSIS — Z0000 Encounter for general adult medical examination without abnormal findings: Secondary | ICD-10-CM | POA: Insufficient documentation

## 2021-10-06 NOTE — Assessment & Plan Note (Signed)
Well adult Orders Placed This Encounter  Procedures  . Pneumococcal conjugate vaccine 20-valent (Prevnar 20)  . COMPLETE METABOLIC PANEL WITH GFR  . CBC with Differential  . Lipid Panel w/reflex Direct LDL  . TSH  . Vitamin D (25 hydroxy)  Screening: Lipid panel.  Up-to-date on colon cancer screening. Immunizations: Prevnar 20 Anticipatory guidance/risk factor reduction: Recommendations per AVS.

## 2021-10-22 DIAGNOSIS — Z1231 Encounter for screening mammogram for malignant neoplasm of breast: Secondary | ICD-10-CM | POA: Diagnosis not present

## 2021-10-26 ENCOUNTER — Encounter: Payer: Self-pay | Admitting: Family Medicine

## 2021-10-30 ENCOUNTER — Ambulatory Visit (INDEPENDENT_AMBULATORY_CARE_PROVIDER_SITE_OTHER): Payer: 59

## 2021-10-30 ENCOUNTER — Other Ambulatory Visit: Payer: Self-pay

## 2021-10-30 DIAGNOSIS — Z0000 Encounter for general adult medical examination without abnormal findings: Secondary | ICD-10-CM

## 2021-10-30 DIAGNOSIS — M85852 Other specified disorders of bone density and structure, left thigh: Secondary | ICD-10-CM | POA: Diagnosis not present

## 2021-10-30 DIAGNOSIS — Z780 Asymptomatic menopausal state: Secondary | ICD-10-CM

## 2021-11-06 DIAGNOSIS — Z8371 Family history of colonic polyps: Secondary | ICD-10-CM | POA: Diagnosis not present

## 2021-11-06 DIAGNOSIS — K219 Gastro-esophageal reflux disease without esophagitis: Secondary | ICD-10-CM | POA: Diagnosis not present

## 2021-11-06 DIAGNOSIS — Z79899 Other long term (current) drug therapy: Secondary | ICD-10-CM | POA: Diagnosis not present

## 2021-11-07 ENCOUNTER — Other Ambulatory Visit (HOSPITAL_BASED_OUTPATIENT_CLINIC_OR_DEPARTMENT_OTHER): Payer: Self-pay

## 2021-11-27 DIAGNOSIS — M19012 Primary osteoarthritis, left shoulder: Secondary | ICD-10-CM | POA: Diagnosis not present

## 2021-11-27 DIAGNOSIS — M7542 Impingement syndrome of left shoulder: Secondary | ICD-10-CM | POA: Diagnosis not present

## 2021-12-16 DIAGNOSIS — H43812 Vitreous degeneration, left eye: Secondary | ICD-10-CM | POA: Diagnosis not present

## 2022-01-03 ENCOUNTER — Other Ambulatory Visit (HOSPITAL_BASED_OUTPATIENT_CLINIC_OR_DEPARTMENT_OTHER): Payer: Self-pay

## 2022-01-03 DIAGNOSIS — R300 Dysuria: Secondary | ICD-10-CM | POA: Diagnosis not present

## 2022-01-03 MED ORDER — SULFAMETHOXAZOLE-TRIMETHOPRIM 800-160 MG PO TABS
ORAL_TABLET | ORAL | 0 refills | Status: DC
  Filled 2022-01-03: qty 6, 3d supply, fill #0

## 2022-01-23 DIAGNOSIS — M8580 Other specified disorders of bone density and structure, unspecified site: Secondary | ICD-10-CM | POA: Diagnosis not present

## 2022-01-23 DIAGNOSIS — R030 Elevated blood-pressure reading, without diagnosis of hypertension: Secondary | ICD-10-CM | POA: Diagnosis not present

## 2022-01-23 DIAGNOSIS — K580 Irritable bowel syndrome with diarrhea: Secondary | ICD-10-CM | POA: Diagnosis not present

## 2022-01-23 DIAGNOSIS — D72819 Decreased white blood cell count, unspecified: Secondary | ICD-10-CM | POA: Diagnosis not present

## 2022-01-24 DIAGNOSIS — R030 Elevated blood-pressure reading, without diagnosis of hypertension: Secondary | ICD-10-CM | POA: Diagnosis not present

## 2022-01-24 DIAGNOSIS — D72819 Decreased white blood cell count, unspecified: Secondary | ICD-10-CM | POA: Diagnosis not present

## 2022-01-30 ENCOUNTER — Other Ambulatory Visit: Payer: Self-pay | Admitting: Hematology & Oncology

## 2022-01-30 ENCOUNTER — Other Ambulatory Visit: Payer: Self-pay

## 2022-01-30 ENCOUNTER — Encounter: Payer: Self-pay | Admitting: Hematology & Oncology

## 2022-01-30 ENCOUNTER — Inpatient Hospital Stay: Payer: Medicare Other | Attending: Hematology & Oncology

## 2022-01-30 ENCOUNTER — Inpatient Hospital Stay: Payer: Medicare Other | Admitting: Hematology & Oncology

## 2022-01-30 VITALS — BP 130/87 | HR 94 | Temp 98.2°F | Resp 18 | Ht 64.0 in | Wt 125.8 lb

## 2022-01-30 DIAGNOSIS — Z801 Family history of malignant neoplasm of trachea, bronchus and lung: Secondary | ICD-10-CM

## 2022-01-30 DIAGNOSIS — Z832 Family history of diseases of the blood and blood-forming organs and certain disorders involving the immune mechanism: Secondary | ICD-10-CM | POA: Insufficient documentation

## 2022-01-30 DIAGNOSIS — D72819 Decreased white blood cell count, unspecified: Secondary | ICD-10-CM | POA: Diagnosis not present

## 2022-01-30 DIAGNOSIS — Z823 Family history of stroke: Secondary | ICD-10-CM | POA: Diagnosis not present

## 2022-01-30 DIAGNOSIS — R634 Abnormal weight loss: Secondary | ICD-10-CM | POA: Diagnosis not present

## 2022-01-30 DIAGNOSIS — Z833 Family history of diabetes mellitus: Secondary | ICD-10-CM | POA: Diagnosis not present

## 2022-01-30 DIAGNOSIS — Z8049 Family history of malignant neoplasm of other genital organs: Secondary | ICD-10-CM

## 2022-01-30 DIAGNOSIS — Z8041 Family history of malignant neoplasm of ovary: Secondary | ICD-10-CM | POA: Diagnosis not present

## 2022-01-30 DIAGNOSIS — Z808 Family history of malignant neoplasm of other organs or systems: Secondary | ICD-10-CM | POA: Insufficient documentation

## 2022-01-30 DIAGNOSIS — D708 Other neutropenia: Secondary | ICD-10-CM

## 2022-01-30 DIAGNOSIS — Z79899 Other long term (current) drug therapy: Secondary | ICD-10-CM | POA: Diagnosis not present

## 2022-01-30 DIAGNOSIS — Z8249 Family history of ischemic heart disease and other diseases of the circulatory system: Secondary | ICD-10-CM | POA: Insufficient documentation

## 2022-01-30 LAB — CBC WITH DIFFERENTIAL (CANCER CENTER ONLY)
Abs Immature Granulocytes: 0.01 10*3/uL (ref 0.00–0.07)
Basophils Absolute: 0 10*3/uL (ref 0.0–0.1)
Basophils Relative: 0 %
Eosinophils Absolute: 0 10*3/uL (ref 0.0–0.5)
Eosinophils Relative: 1 %
HCT: 44.6 % (ref 36.0–46.0)
Hemoglobin: 15.2 g/dL — ABNORMAL HIGH (ref 12.0–15.0)
Immature Granulocytes: 0 %
Lymphocytes Relative: 63 %
Lymphs Abs: 1.9 10*3/uL (ref 0.7–4.0)
MCH: 30.5 pg (ref 26.0–34.0)
MCHC: 34.1 g/dL (ref 30.0–36.0)
MCV: 89.4 fL (ref 80.0–100.0)
Monocytes Absolute: 0.2 10*3/uL (ref 0.1–1.0)
Monocytes Relative: 7 %
Neutro Abs: 0.9 10*3/uL — ABNORMAL LOW (ref 1.7–7.7)
Neutrophils Relative %: 29 %
Platelet Count: 201 10*3/uL (ref 150–400)
RBC: 4.99 MIL/uL (ref 3.87–5.11)
RDW: 12.6 % (ref 11.5–15.5)
WBC Count: 3 10*3/uL — ABNORMAL LOW (ref 4.0–10.5)
nRBC: 0 % (ref 0.0–0.2)

## 2022-01-30 LAB — SAVE SMEAR(SSMR), FOR PROVIDER SLIDE REVIEW

## 2022-01-30 NOTE — Progress Notes (Signed)
Referral MD ? 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 ?Chief Complaint  ?Patient presents with  ? New Patient (Initial Visit)  ?: I white blood cells have been low. ? ?HPI: Mariah Everett is a very charming 66 year old African-American female.  She comes in with her husband.  She certainly does not look her age.  She has 52 year old son and 2 grandchildren. ? ?She has had no problem with infections.  She has had no issues with COVID.  She has had no issues with sickle cell disease.  There is no family history of blood problems as far she knows. ? ?She has had low white cells for at least 3 years.  She has had low neutrophils.  Again, this is chronic.  She has had no problems with swollen lymph nodes.  She has had little bit of weight loss.  Her appetite is okay.. ? ?She is up-to-date with her mammogram and with her colonoscopy. ? ?She has had past surgeries without any difficulty. ? ?She has had no bleeding.  There is been no leg swelling.  She has had no headache. ? ?Her lab work go back to 2010 showed a white cell count of 3.  Hemoglobin 14.2.  Hematocrit 42.5.  She had 23% neutrophils and 6 6% lymphocytes. ? ?In 2014, her white cell count was 2.3.  Hemoglobin 14.3.  Platelet count 193,000.  She had 27% neutrophils and 62% lymphocytes. ? ?In 2017, her white count 3.1.  Hemoglobin 14.3.  Platelet count 182,000.  Neutrophils were 44% with 50% lymphocytes. ? ?Again, this has been a chronic problem for her. ? ?She does not drink.  She does not smoke. ? ?There is no history of diabetes. ? ?Overall, I would say performance status is ECOG 0. ?Past Medical History:  ?Diagnosis Date  ? CERUMEN IMPACTION, BILATERAL 09/03/2009  ? Qualifier: Diagnosis of  By: Royal Piedra NP, Tammy    ? CERVICAL RADICULOPATHY 12/15/2008  ? - MRI 12/22/2008 Pos C7 disc protrusion > Discectomy and fusion 05/18/2009 [facility] [place]     ? Chronic benign neutropenia (HCC)   ? Esophageal dysmotility   ? Loss of weight  08/12/2016  ? Neck pain   ? radicular C7/8 right. MRI 12-22-18 pos C7 disc protrusion  ? Preventative health care 11/03/2013  ? Followed as Primary Care Patient/ [facility]/ [facility]    ? Rectal bleeding 07/11/2011  ? Negative colonoscopy 09/12/11  Dr Genelle Gather   ? Sciatica of left side 11/10/2014  ? Followed as Primary Care Patient/ [facility]/ [facility] > referred  Back to [facility]    ? Shoulder pain   ? Thyroid nodule   ? a. Bx/TFTs normal by prior workup, clinically benign.  ?: ? ? ?Past Surgical History:  ?Procedure Laterality Date  ? BIOPSY THYROID    ? CERVICAL DISCECTOMY  05-18-2009  ? CERVICAL FUSION  05-18-2009  ? HEMORROIDECTOMY    ? 1990  ? LEFT HEART CATHETERIZATION WITH CORONARY ANGIOGRAM N/A 05/19/2014  ? Procedure: LEFT HEART CATHETERIZATION WITH CORONARY ANGIOGRAM;  Surgeon: Jolaine Artist, MD;  Location: [facility] CATH LAB;  Service: Cardiovascular;  Laterality: N/A;  ?: ? ? ?Current Outpatient Medications:  ?  Multiple Vitamin (MULTIVITAMIN) tablet, Take 1 tablet by mouth daily., Disp: , Rfl:  ?  omeprazole (PRILOSEC) 20 MG capsule, Take 1 capsule by mouth daily, Disp: 30 capsule, Rfl: 6 ?  TURMERIC PO, Take 1,000 mg by mouth daily., Disp: , Rfl: : ? ?: ? ?No Known  Allergies: ? ? ?Family History  ?Problem Relation Age of Onset  ? Ovarian cancer Mother   ? Lung cancer Mother   ? Cervical cancer Mother   ? High blood pressure Mother   ? Stroke Mother   ? Throat cancer Father   ? Lupus Sister   ? High blood pressure Sister   ? Diabetes Son   ? High blood pressure Other   ?: ? ? ?Social History  ? ?Socioeconomic History  ? Marital status: Married  ?  Spouse name: Mariah Everett  ? Number of children: 1  ? Years of education: Not on file  ? Highest education level: Some college, no degree  ?Occupational History  ? Occupation: insurance  ? Occupation: Patient Nurse, learning disability  ?  Employer: [employer]  ?Tobacco Use  ? Smoking status: Never  ? Smokeless tobacco: Never  ?Vaping Use  ? Vaping  Use: Never used  ?Substance and Sexual Activity  ? Alcohol use: Not Currently  ?  Comment: occ  ? Drug use: No  ? Sexual activity: Yes  ?  Partners: Male  ?  Birth control/protection: None, Post-menopausal  ?Other Topics Concern  ? Not on file  ?Social History Narrative  ? Not on file  ? ?Social Determinants of Health  ? ?Financial Resource Strain: Not on file  ?Food Insecurity: Not on file  ?Transportation Needs: Not on file  ?Physical Activity: Not on file  ?Stress: Not on file  ?Social Connections: Not on file  ?Intimate Partner Violence: Not on file  ?: ? ?Review of Systems  ?Constitutional:  Positive for weight loss.  ?HENT: Negative.    ?Eyes: Negative.   ?Respiratory: Negative.    ?Cardiovascular: Negative.   ?Gastrointestinal: Negative.   ?Genitourinary: Negative.   ?Musculoskeletal: Negative.   ?Skin: Negative.   ?Neurological: Negative.   ?Endo/Heme/Allergies: Negative.   ?Psychiatric/Behavioral: Negative.    ? ? ?Exam: ?'@IPVITALS' @ ?Physical Exam ?Vitals reviewed.  ?HENT:  ?   Head: Normocephalic and atraumatic.  ?Eyes:  ?   Pupils: Pupils are equal, round, and reactive to light.  ?Cardiovascular:  ?   Rate and Rhythm: Normal rate and regular rhythm.  ?   Heart sounds: Normal heart sounds.  ?Pulmonary:  ?   Effort: Pulmonary effort is normal.  ?   Breath sounds: Normal breath sounds.  ?Abdominal:  ?   General: Bowel sounds are normal.  ?   Palpations: Abdomen is soft.  ?Musculoskeletal:     ?   General: No tenderness or deformity. Normal range of motion.  ?   Cervical back: Normal range of motion.  ?Lymphadenopathy:  ?   Cervical: No cervical adenopathy.  ?Skin: ?   General: Skin is warm and dry.  ?   Findings: No erythema or rash.  ?Neurological:  ?   Mental Status: She is alert and oriented to person, place, and time.  ?Psychiatric:     ?   Behavior: Behavior normal.     ?   Thought Content: Thought content normal.     ?   Judgment: Judgment normal.  ? ? ?Recent Labs  ?  01/30/22 ?1046  ?WBC 3.0*  ?HGB  15.2*  ?HCT 44.6  ?PLT 201  ? ?No results for input(s): NA, K, CL, CO2, GLUCOSE, BUN, CREATININE, CALCIUM in the last 72 hours. ? ?Blood smear review: Normochromic normocytic population of red blood cells.  I see no nucleated red blood cells.  I see no teardrop cells.  She has no schistocytes or spherocytes.  She has no rouleaux formation.  White blood cells are decreased in number.  She has majority lymphocytes.  The neutrophils are mature.  I do not have any hypersegmented polys.  I do not see any immature myeloid or lymphoid cells.  There are no blasts.  Platelets are adequate number and size. ? ?Pathology: None ? ? ? ?Assessment and Plan: Ms. Dworkin is a very charming 66 year old Afro-American female.  She has chronic leukopenia.  This goes back to at least 2010.  Her white blood cell count and white blood cell differential really have not changed in this time. ? 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  Everything fits with this diagnosis.  She has had no infections.  She had no swollen lymph nodes.  There is no splenomegaly.  She has the reverse neutrophil/lymphocyte ratio. ? ?I am went over the diagnosis of EAL.  I told her that there is no studies have shown that this leads to any other problem.  There is no increased risk of infection.  She just has lower white blood cells. ? ?I told her that the ultimate diagnosis would be a bone marrow biopsy.  I just do not think that we have to go down that road so far with the bone marrow biopsy. ? ?I will like to get her back in 4 months.  I think this would be reasonable.  If she has any questions she can certainly let me know. ? ?I gave her a prayer blanket.  She is very grateful for this.  Both she and her husband, who is with her, have a very strong faith. ?  ?

## 2022-05-12 ENCOUNTER — Ambulatory Visit: Payer: Medicare Other | Attending: Internal Medicine

## 2022-05-12 ENCOUNTER — Other Ambulatory Visit (HOSPITAL_BASED_OUTPATIENT_CLINIC_OR_DEPARTMENT_OTHER): Payer: Self-pay

## 2022-05-12 DIAGNOSIS — Z23 Encounter for immunization: Secondary | ICD-10-CM

## 2022-05-13 ENCOUNTER — Other Ambulatory Visit (HOSPITAL_BASED_OUTPATIENT_CLINIC_OR_DEPARTMENT_OTHER): Payer: Self-pay

## 2022-05-13 MED ORDER — PFIZER COVID-19 VAC BIVALENT 30 MCG/0.3ML IM SUSP
INTRAMUSCULAR | 0 refills | Status: DC
Start: 2022-05-12 — End: 2022-06-02
  Filled 2022-05-13: qty 0.3, 1d supply, fill #0

## 2022-05-13 NOTE — Progress Notes (Signed)
   Covid-19 Vaccination Clinic  Name:  Mariah Everett    MRN: [id number] DOB: 03-10-1956  05/13/2022  [name] was observed post Covid-19 immunization for 15 minutes without incident. She was provided with Vaccine Information Sheet and instruction to access the V-Safe system.   [name] was instructed to call 911 with any severe reactions post vaccine: Difficulty breathing  Swelling of face and throat  A fast heartbeat  A bad rash all over body  Dizziness and weakness   Immunizations Administered     Name Date Dose VIS Date Route   Pfizer Covid-19 Vaccine Bivalent Booster 05/12/2022  2:28 PM 0.3 mL 07/24/2021 Intramuscular   Manufacturer: [place]   Lot: [id number]   [place]: [phone]

## 2022-05-22 ENCOUNTER — Other Ambulatory Visit (HOSPITAL_BASED_OUTPATIENT_CLINIC_OR_DEPARTMENT_OTHER): Payer: Self-pay

## 2022-05-26 DIAGNOSIS — H524 Presbyopia: Secondary | ICD-10-CM | POA: Diagnosis not present

## 2022-06-02 ENCOUNTER — Other Ambulatory Visit: Payer: Self-pay

## 2022-06-02 ENCOUNTER — Encounter: Payer: Self-pay | Admitting: Family

## 2022-06-02 ENCOUNTER — Inpatient Hospital Stay: Payer: Medicare Other | Admitting: Family

## 2022-06-02 ENCOUNTER — Inpatient Hospital Stay: Payer: Medicare Other | Attending: Hematology & Oncology

## 2022-06-02 VITALS — BP 128/85 | HR 90 | Temp 98.4°F | Resp 18 | Ht 64.0 in | Wt 126.3 lb

## 2022-06-02 DIAGNOSIS — D708 Other neutropenia: Secondary | ICD-10-CM

## 2022-06-02 DIAGNOSIS — Z79899 Other long term (current) drug therapy: Secondary | ICD-10-CM | POA: Insufficient documentation

## 2022-06-02 DIAGNOSIS — M25512 Pain in left shoulder: Secondary | ICD-10-CM | POA: Insufficient documentation

## 2022-06-02 DIAGNOSIS — D72819 Decreased white blood cell count, unspecified: Secondary | ICD-10-CM | POA: Insufficient documentation

## 2022-06-02 LAB — CBC WITH DIFFERENTIAL (CANCER CENTER ONLY)
Abs Immature Granulocytes: 0 10*3/uL (ref 0.00–0.07)
Basophils Absolute: 0 10*3/uL (ref 0.0–0.1)
Basophils Relative: 0 %
Eosinophils Absolute: 0 10*3/uL (ref 0.0–0.5)
Eosinophils Relative: 1 %
HCT: 45.8 % (ref 36.0–46.0)
Hemoglobin: 15.1 g/dL — ABNORMAL HIGH (ref 12.0–15.0)
Immature Granulocytes: 0 %
Lymphocytes Relative: 51 %
Lymphs Abs: 1.8 10*3/uL (ref 0.7–4.0)
MCH: 29.7 pg (ref 26.0–34.0)
MCHC: 33 g/dL (ref 30.0–36.0)
MCV: 90 fL (ref 80.0–100.0)
Monocytes Absolute: 0.2 10*3/uL (ref 0.1–1.0)
Monocytes Relative: 5 %
Neutro Abs: 1.5 10*3/uL — ABNORMAL LOW (ref 1.7–7.7)
Neutrophils Relative %: 43 %
Platelet Count: 224 10*3/uL (ref 150–400)
RBC: 5.09 MIL/uL (ref 3.87–5.11)
RDW: 12.5 % (ref 11.5–15.5)
WBC Count: 3.4 10*3/uL — ABNORMAL LOW (ref 4.0–10.5)
nRBC: 0 % (ref 0.0–0.2)

## 2022-06-02 LAB — CMP (CANCER CENTER ONLY)
ALT: 13 U/L (ref 0–44)
AST: 17 U/L (ref 15–41)
Albumin: 5 g/dL (ref 3.5–5.0)
Alkaline Phosphatase: 41 U/L (ref 38–126)
Anion gap: 7 (ref 5–15)
BUN: 8 mg/dL (ref 8–23)
CO2: 28 mmol/L (ref 22–32)
Calcium: 9.9 mg/dL (ref 8.9–10.3)
Chloride: 104 mmol/L (ref 98–111)
Creatinine: 0.63 mg/dL (ref 0.44–1.00)
GFR, Estimated: >60 mL/min (ref >60)
Glucose, Bld: 121 mg/dL — ABNORMAL HIGH (ref 70–99)
Potassium: 3.8 mmol/L (ref 3.5–5.1)
Sodium: 139 mmol/L (ref 135–145)
Total Bilirubin: 0.6 mg/dL (ref 0.3–1.2)
Total Protein: 7.2 g/dL (ref 6.5–8.1)

## 2022-06-02 LAB — LACTATE DEHYDROGENASE: LDH: 189 U/L (ref 98–192)

## 2022-06-02 LAB — SAVE SMEAR(SSMR), FOR PROVIDER SLIDE REVIEW

## 2022-06-02 NOTE — Progress Notes (Signed)
Hematology and Oncology Follow Up Visit  Mariah Everett [id number] [birth year] 66 y.o. 06/02/2022   Principle Diagnosis:  Ethnic associated leukopenia   Current Therapy:   Observation   Interim History:  Mariah Everett is here today for follow-up. She is doing well and has not had any issues with infection.  No fever, chills, n/v, cough, rash, dizziness, chest pain, palpitations, abdominal pain or changes in bowel or bladder habits.  She has stiffness in her hands in the AM. This resolves with flexing her fingers.  No swelling in her extremities at this time.  She has intermittent burning pain in the left shoulder and has history of arthritis in the left shoulder.  No falls or syncope reported.  He has maintained a good appetite and is staying well hydrated. Her weight is stable at 126 lbs.   ECOG Performance Status: 0 - Asymptomatic  Medications:  Allergies as of 06/02/2022   No Known Allergies      Medication List        Accurate as of June 02, 2022 11:01 AM. If you have any questions, ask your nurse or doctor.          STOP taking these medications    multivitamin tablet Stopped by: Lottie Dawson, NP   Pfizer COVID-19 Vac Bivalent injection Generic drug: COVID-19 mRNA bivalent vaccine Therapist, music) Stopped by: Lottie Dawson, NP       TAKE these medications    CULTURELLE PROBIOTICS PO Take 1 capsule by mouth daily.   FISH OIL PO Take 1 capsule by mouth daily.   omeprazole 20 MG capsule Commonly known as: PRILOSEC Take 1 capsule by mouth daily   TURMERIC PO Take 1,000 mg by mouth daily.   VITAMIN D PO Take 1 capsule by mouth daily.   Zinc 50 MG Tabs Take 1 tablet by mouth daily.        Allergies: No Known Allergies  Past Medical History, Surgical history, Social history, and Family History were reviewed and updated.  Review of Systems: All other 10 point review of systems is negative.   Physical Exam:  height is '5\' 4"'$  (1.626 m) and weight is  126 lb 4.8 oz (57.3 kg). Her oral temperature is 98.4 F (36.9 C). Her blood pressure is 128/85 and her pulse is 90. Her respiration is 18 and oxygen saturation is 100%.   Wt Readings from Last 3 Encounters:  06/02/22 126 lb 4.8 oz (57.3 kg)  01/30/22 125 lb 12.8 oz (57.1 kg)  10/04/21 130 lb (59 kg)    Ocular: Sclerae unicteric, pupils equal, round and reactive to light Ear-nose-throat: Oropharynx clear, dentition fair Lymphatic: No cervical or supraclavicular adenopathy Lungs no rales or rhonchi, good excursion bilaterally Heart regular rate and rhythm, no murmur appreciated Abd soft, nontender, positive bowel sounds MSK no focal spinal tenderness, no joint edema Neuro: non-focal, well-oriented, appropriate affect Breasts: Deferred   Lab Results  Component Value Date   WBC 3.4 (L) 06/02/2022   HGB 15.1 (H) 06/02/2022   HCT 45.8 06/02/2022   MCV 90.0 06/02/2022   PLT 224 06/02/2022   No results found for: "FERRITIN", "IRON", "TIBC", "UIBC", "IRONPCTSAT" Lab Results  Component Value Date   RBC 5.09 06/02/2022   No results found for: "KPAFRELGTCHN", "LAMBDASER", "KAPLAMBRATIO" No results found for: "IGGSERUM", "IGA", "IGMSERUM" No results found for: "TOTALPROTELP", "ALBUMINELP", "A1GS", "A2GS", "BETS", "BETA2SER", "GAMS", "MSPIKE", "SPEI"   Chemistry      Component Value Date/Time   NA 139 06/02/2022 0913  K 3.8 06/02/2022 0913   CL 104 06/02/2022 0913   CO2 28 06/02/2022 0913   BUN 8 06/02/2022 0913   CREATININE 0.63 06/02/2022 0913   CREATININE 0.64 10/04/2021 0000      Component Value Date/Time   CALCIUM 9.9 06/02/2022 0913   ALKPHOS 41 06/02/2022 0913   AST 17 06/02/2022 0913   ALT 13 06/02/2022 0913   BILITOT 0.6 06/02/2022 0913       Impression and Plan: Mariah Everett is a very pleasant 66 yo African [place] female with long history of chronic leukopenia felt to be EAL.  She continues to do well and her counts today are stable. WBC count 3.4, ANC 1.5.   Follow-up in 6 months.   Lottie Dawson, NP 7/10/202311:01 AM

## 2022-06-04 ENCOUNTER — Other Ambulatory Visit: Payer: Self-pay | Admitting: Internal Medicine

## 2022-06-04 ENCOUNTER — Other Ambulatory Visit (HOSPITAL_BASED_OUTPATIENT_CLINIC_OR_DEPARTMENT_OTHER): Payer: Self-pay

## 2022-06-04 ENCOUNTER — Ambulatory Visit
Admission: RE | Admit: 2022-06-04 | Discharge: 2022-06-04 | Disposition: A | Discharge status: Home / Self Care | Payer: Medicare Other | Source: Ambulatory Visit | Attending: Internal Medicine | Admitting: Internal Medicine

## 2022-06-04 DIAGNOSIS — R739 Hyperglycemia, unspecified: Secondary | ICD-10-CM | POA: Diagnosis not present

## 2022-06-04 DIAGNOSIS — M25512 Pain in left shoulder: Secondary | ICD-10-CM | POA: Diagnosis not present

## 2022-06-04 DIAGNOSIS — M25519 Pain in unspecified shoulder: Secondary | ICD-10-CM | POA: Diagnosis not present

## 2022-06-04 DIAGNOSIS — D72819 Decreased white blood cell count, unspecified: Secondary | ICD-10-CM | POA: Diagnosis not present

## 2022-06-04 DIAGNOSIS — R52 Pain, unspecified: Secondary | ICD-10-CM

## 2022-06-04 DIAGNOSIS — Z981 Arthrodesis status: Secondary | ICD-10-CM | POA: Diagnosis not present

## 2022-06-04 DIAGNOSIS — R030 Elevated blood-pressure reading, without diagnosis of hypertension: Secondary | ICD-10-CM | POA: Diagnosis not present

## 2022-06-04 MED ORDER — METHOCARBAMOL 500 MG PO TABS
500.0000 mg | ORAL_TABLET | Freq: Every evening | ORAL | 0 refills | Status: DC | PRN
  Filled 2022-06-04: qty 20, 20d supply, fill #0

## 2022-08-15 ENCOUNTER — Other Ambulatory Visit (HOSPITAL_BASED_OUTPATIENT_CLINIC_OR_DEPARTMENT_OTHER): Payer: Self-pay

## 2022-08-15 MED ORDER — FLUAD QUADRIVALENT 0.5 ML IM PRSY
PREFILLED_SYRINGE | INTRAMUSCULAR | 0 refills | Status: DC
  Filled 2022-08-15: qty 0.5, 1d supply, fill #0

## 2022-10-03 DIAGNOSIS — E559 Vitamin D deficiency, unspecified: Secondary | ICD-10-CM | POA: Diagnosis not present

## 2022-10-03 DIAGNOSIS — Z136 Encounter for screening for cardiovascular disorders: Secondary | ICD-10-CM | POA: Diagnosis not present

## 2022-10-03 DIAGNOSIS — Z1329 Encounter for screening for other suspected endocrine disorder: Secondary | ICD-10-CM | POA: Diagnosis not present

## 2022-10-03 DIAGNOSIS — Z0001 Encounter for general adult medical examination with abnormal findings: Secondary | ICD-10-CM | POA: Diagnosis not present

## 2022-10-13 DIAGNOSIS — Z23 Encounter for immunization: Secondary | ICD-10-CM | POA: Diagnosis not present

## 2022-10-13 DIAGNOSIS — Z1211 Encounter for screening for malignant neoplasm of colon: Secondary | ICD-10-CM | POA: Diagnosis not present

## 2022-10-13 DIAGNOSIS — Z0001 Encounter for general adult medical examination with abnormal findings: Secondary | ICD-10-CM | POA: Diagnosis not present

## 2022-10-13 DIAGNOSIS — Z136 Encounter for screening for cardiovascular disorders: Secondary | ICD-10-CM | POA: Diagnosis not present

## 2022-10-20 DIAGNOSIS — M25512 Pain in left shoulder: Secondary | ICD-10-CM | POA: Diagnosis not present

## 2022-10-20 DIAGNOSIS — M19012 Primary osteoarthritis, left shoulder: Secondary | ICD-10-CM | POA: Diagnosis not present

## 2022-10-20 DIAGNOSIS — M7542 Impingement syndrome of left shoulder: Secondary | ICD-10-CM | POA: Diagnosis not present

## 2022-10-23 ENCOUNTER — Inpatient Hospital Stay: Payer: Medicare Other | Attending: Hematology & Oncology

## 2022-10-23 ENCOUNTER — Inpatient Hospital Stay: Payer: Medicare Other | Admitting: Medical Oncology

## 2022-10-23 ENCOUNTER — Encounter: Payer: Self-pay | Admitting: Medical Oncology

## 2022-10-23 VITALS — BP 133/92 | Temp 98.7°F | Resp 18 | Ht 64.0 in | Wt 126.1 lb

## 2022-10-23 DIAGNOSIS — R42 Dizziness and giddiness: Secondary | ICD-10-CM | POA: Diagnosis not present

## 2022-10-23 DIAGNOSIS — N3090 Cystitis, unspecified without hematuria: Secondary | ICD-10-CM | POA: Diagnosis not present

## 2022-10-23 DIAGNOSIS — D72818 Other decreased white blood cell count: Secondary | ICD-10-CM | POA: Diagnosis not present

## 2022-10-23 DIAGNOSIS — Z79899 Other long term (current) drug therapy: Secondary | ICD-10-CM | POA: Insufficient documentation

## 2022-10-23 DIAGNOSIS — R5383 Other fatigue: Secondary | ICD-10-CM | POA: Insufficient documentation

## 2022-10-23 DIAGNOSIS — D709 Neutropenia, unspecified: Secondary | ICD-10-CM | POA: Diagnosis not present

## 2022-10-23 DIAGNOSIS — G629 Polyneuropathy, unspecified: Secondary | ICD-10-CM | POA: Diagnosis not present

## 2022-10-23 DIAGNOSIS — D708 Other neutropenia: Secondary | ICD-10-CM

## 2022-10-23 LAB — CMP (CANCER CENTER ONLY)
ALT: 15 U/L (ref 0–44)
AST: 17 U/L (ref 15–41)
Albumin: 5.1 g/dL — ABNORMAL HIGH (ref 3.5–5.0)
Alkaline Phosphatase: 37 U/L — ABNORMAL LOW (ref 38–126)
Anion gap: 7 (ref 5–15)
BUN: 7 mg/dL — ABNORMAL LOW (ref 8–23)
CO2: 30 mmol/L (ref 22–32)
Calcium: 10 mg/dL (ref 8.9–10.3)
Chloride: 106 mmol/L (ref 98–111)
Creatinine: 0.65 mg/dL (ref 0.44–1.00)
GFR, Estimated: >60 mL/min (ref >60)
Glucose, Bld: 101 mg/dL — ABNORMAL HIGH (ref 70–99)
Potassium: 4.6 mmol/L (ref 3.5–5.1)
Sodium: 143 mmol/L (ref 135–145)
Total Bilirubin: 0.9 mg/dL (ref 0.3–1.2)
Total Protein: 7.1 g/dL (ref 6.5–8.1)

## 2022-10-23 LAB — CBC WITH DIFFERENTIAL (CANCER CENTER ONLY)
Abs Immature Granulocytes: 0 10*3/uL (ref 0.00–0.07)
Basophils Absolute: 0 10*3/uL (ref 0.0–0.1)
Basophils Relative: 0 %
Eosinophils Absolute: 0 10*3/uL (ref 0.0–0.5)
Eosinophils Relative: 1 %
HCT: 45.5 % (ref 36.0–46.0)
Hemoglobin: 14.9 g/dL (ref 12.0–15.0)
Immature Granulocytes: 0 %
Lymphocytes Relative: 72 %
Lymphs Abs: 2.2 10*3/uL (ref 0.7–4.0)
MCH: 29.7 pg (ref 26.0–34.0)
MCHC: 32.7 g/dL (ref 30.0–36.0)
MCV: 90.6 fL (ref 80.0–100.0)
Monocytes Absolute: 0.3 10*3/uL (ref 0.1–1.0)
Monocytes Relative: 9 %
Neutro Abs: 0.5 10*3/uL — ABNORMAL LOW (ref 1.7–7.7)
Neutrophils Relative %: 18 %
Platelet Count: 200 10*3/uL (ref 150–400)
RBC: 5.02 MIL/uL (ref 3.87–5.11)
RDW: 12.7 % (ref 11.5–15.5)
Smear Review: NORMAL
WBC Count: 3 10*3/uL — ABNORMAL LOW (ref 4.0–10.5)
nRBC: 0 % (ref 0.0–0.2)

## 2022-10-23 LAB — LACTATE DEHYDROGENASE: LDH: 171 U/L (ref 98–192)

## 2022-10-23 LAB — SAVE SMEAR(SSMR), FOR PROVIDER SLIDE REVIEW

## 2022-10-23 NOTE — Progress Notes (Signed)
Hematology and Oncology Follow Up Visit  Mariah Everett [id number] 1956/01/03 66 y.o. 10/23/2022   Principle Diagnosis:  Ethnic associated leukopenia   Current Therapy:   Observation   Interim History:  Mariah Everett is here today for early follow-up by recommendation of her new PCP Dr. Maia Petties with [facility]. They are concerned about her [place] which keeps trending down. Patient and husband are also concerned. She has had leukopenia for many years. Thought to be EAN. No recent illness, infections, night sweats, fevers. No known RA.   Overall she reports that she is fatigued. Working with PCP on dizzy spells which are chronic in nature. Also has peripheral neuropathy. Follow for cystitis. No pain, appetite is good- weight is stable.    No falls or syncope reported.  He has maintained a good appetite and is staying well hydrated. Her weight is stable at 126 lbs.  Wt Readings from Last 3 Encounters:  10/23/22 126 lb 1.3 oz (57.2 kg)  06/02/22 126 lb 4.8 oz (57.3 kg)  01/30/22 125 lb 12.8 oz (57.1 kg)    ECOG Performance Status: 0 - Asymptomatic  Medications:  Allergies as of 10/23/2022   No Known Allergies      Medication List        Accurate as of October 23, 2022 11:49 AM. If you have any questions, ask your nurse or doctor.          B COMPLEX-VITAMIN B12 PO Take 1 tablet by mouth daily.   CULTURELLE PROBIOTICS PO Take 1 capsule by mouth daily.   FISH OIL PO Take 1 capsule by mouth daily.   Fluad Quadrivalent 0.5 ML injection Generic drug: influenza vaccine adjuvanted Inject into the muscle.   methocarbamol 500 MG tablet Commonly known as: ROBAXIN Take 1 tablet (500 mg total) by mouth at bedtime as needed.   omeprazole 20 MG capsule Commonly known as: PRILOSEC Take 1 capsule by mouth daily   TURMERIC PO Take 1,000 mg by mouth daily.   VITAMIN D PO Take 1 capsule by mouth daily.   Zinc 50 MG Tabs Take 1 tablet by mouth daily.         Allergies: No Known Allergies  Past Medical History, Surgical history, Social history, and Family History were reviewed and updated.  Review of Systems: All other 10 point review of systems is negative.   Physical Exam:  weight is 126 lb 1.3 oz (57.2 kg). Her oral temperature is 98.7 F (37.1 C). Her respiration is 18.   Wt Readings from Last 3 Encounters:  10/23/22 126 lb 1.3 oz (57.2 kg)  06/02/22 126 lb 4.8 oz (57.3 kg)  01/30/22 125 lb 12.8 oz (57.1 kg)    Ocular: Sclerae unicteric, pupils equal, round and reactive to light Ear-nose-throat: Oropharynx clear, dentition fair Lymphatic: No cervical or supraclavicular adenopathy Lungs no rales or rhonchi, good excursion bilaterally Heart regular rate and rhythm, no murmur appreciated Abd soft, nontender, positive bowel sounds MSK no focal spinal tenderness, no joint edema Neuro: non-focal, well-oriented, appropriate affect Lymph: No enlargement of lymphnodes.   Lab Results  Component Value Date   WBC 3.0 (L) 10/23/2022   HGB 14.9 10/23/2022   HCT 45.5 10/23/2022   MCV 90.6 10/23/2022   PLT 200 10/23/2022   No results found for: "FERRITIN", "IRON", "TIBC", "UIBC", "IRONPCTSAT" Lab Results  Component Value Date   RBC 5.02 10/23/2022   No results found for: "KPAFRELGTCHN", "LAMBDASER", "KAPLAMBRATIO" No results found for: "IGGSERUM", "IGA", "IGMSERUM"  No results found for: "TOTALPROTELP", "ALBUMINELP", "A1GS", "A2GS", "BETS", "BETA2SER", "GAMS", "MSPIKE", "SPEI"   Chemistry      Component Value Date/Time   NA 139 06/02/2022 0913   K 3.8 06/02/2022 0913   CL 104 06/02/2022 0913   CO2 28 06/02/2022 0913   BUN 8 06/02/2022 0913   CREATININE 0.63 06/02/2022 0913   CREATININE 0.64 10/04/2021 0000      Component Value Date/Time   CALCIUM 9.9 06/02/2022 0913   ALKPHOS 41 06/02/2022 0913   AST 17 06/02/2022 0913   ALT 13 06/02/2022 0913   BILITOT 0.6 06/02/2022 0913      Encounter Diagnosis  Name Primary?    Other neutropenia ([place]) Yes    Impression and Plan: Mariah Everett is a 66 yo African [place] female with long history of chronic leukopenia felt to be EAL. Reviewed her labs from her PCP which show more significant decline in function compared to our labs. Consulted with Dr. Elnoria Howard. Patient and husband expressed frustration that they were not personally seen by Dr. Elnoria Howard today. Discussed and reviewed APP role. We compared lab results and also her blood smear today. Blood smear showing increase lymphocytes and some anatomical variation of concern. I discussed this with patient as well as our recommendation for a bone marrow biopsy as the next step to better confirm EAN vs other process. For now we discussed red flag signs and symptoms  Disposition Flow cytometry  Bone Marrow Biopsy within 2 weeks ideally RTC 1 week after with Dr. Elnoria Howard to discuss results   Hughie Closs, PA-C 11/30/202311:49 AM

## 2022-10-28 LAB — SURGICAL PATHOLOGY

## 2022-10-29 ENCOUNTER — Other Ambulatory Visit (HOSPITAL_BASED_OUTPATIENT_CLINIC_OR_DEPARTMENT_OTHER): Payer: Self-pay

## 2022-10-29 DIAGNOSIS — Z124 Encounter for screening for malignant neoplasm of cervix: Secondary | ICD-10-CM | POA: Diagnosis not present

## 2022-10-29 DIAGNOSIS — Z1231 Encounter for screening mammogram for malignant neoplasm of breast: Secondary | ICD-10-CM | POA: Diagnosis not present

## 2022-10-29 DIAGNOSIS — Z01411 Encounter for gynecological examination (general) (routine) with abnormal findings: Secondary | ICD-10-CM | POA: Diagnosis not present

## 2022-10-29 DIAGNOSIS — Z01419 Encounter for gynecological examination (general) (routine) without abnormal findings: Secondary | ICD-10-CM | POA: Diagnosis not present

## 2022-10-29 MED ORDER — ESTRADIOL 0.1 MG/GM VA CREA
TOPICAL_CREAM | VAGINAL | 6 refills | Status: DC
  Filled 2022-10-29: qty 42.5, 30d supply, fill #0

## 2022-10-30 ENCOUNTER — Other Ambulatory Visit (HOSPITAL_BASED_OUTPATIENT_CLINIC_OR_DEPARTMENT_OTHER): Payer: Self-pay

## 2022-10-31 LAB — FLOW CYTOMETRY

## 2022-11-06 DIAGNOSIS — D485 Neoplasm of uncertain behavior of skin: Secondary | ICD-10-CM | POA: Diagnosis not present

## 2022-11-06 DIAGNOSIS — D2272 Melanocytic nevi of left lower limb, including hip: Secondary | ICD-10-CM | POA: Diagnosis not present

## 2022-11-06 DIAGNOSIS — L905 Scar conditions and fibrosis of skin: Secondary | ICD-10-CM | POA: Diagnosis not present

## 2022-11-07 ENCOUNTER — Encounter: Payer: Self-pay | Admitting: Hematology & Oncology

## 2022-11-13 ENCOUNTER — Inpatient Hospital Stay: Payer: Medicare Other

## 2022-11-13 ENCOUNTER — Encounter: Payer: Self-pay | Admitting: Hematology & Oncology

## 2022-11-13 ENCOUNTER — Other Ambulatory Visit: Payer: Self-pay

## 2022-11-13 ENCOUNTER — Inpatient Hospital Stay: Payer: Medicare Other | Attending: Hematology & Oncology | Admitting: Hematology & Oncology

## 2022-11-13 VITALS — BP 149/87 | HR 90 | Temp 97.9°F | Resp 18 | Ht 64.0 in | Wt 126.8 lb

## 2022-11-13 DIAGNOSIS — Z79899 Other long term (current) drug therapy: Secondary | ICD-10-CM | POA: Diagnosis not present

## 2022-11-13 DIAGNOSIS — D708 Other neutropenia: Secondary | ICD-10-CM

## 2022-11-13 DIAGNOSIS — R5383 Other fatigue: Secondary | ICD-10-CM | POA: Insufficient documentation

## 2022-11-13 DIAGNOSIS — D72818 Other decreased white blood cell count: Secondary | ICD-10-CM | POA: Diagnosis not present

## 2022-11-13 NOTE — Progress Notes (Signed)
Hematology and Oncology Follow Up Visit  Mariah Everett [id number] 1956/04/08 66 y.o. 11/13/2022   Principle Diagnosis:  Ethnic associated leukopenia   Current Therapy:   Observation   Interim History:  Mariah Everett is here today for follow-up.  She was going to talk about the possibility of doing a bone marrow biopsy.  At this point, I think that this may not be a bad idea.  When we last saw Mariah, we did flow cytometry on Mariah peripheral blood.  There was a very minor population of T lymphocytes.  That could indicate that she may have large granular lymphocytic leukemia.  She does have arthritic issues.  I talked to Mariah about a bone marrow biopsy.  Mariah Everett was with Mariah.  I explained why I thought a bone marrow biopsy would help [place] out.  It would certainly give [place] a diagnosis for Mariah.  She could certainly have Ethnic Associate Leukopenia.  She is African-American.  She also could have myelodysplasia given that she is 66 years old.  Under the microscope, I really see nothing on the blood smear that would be to just give.  I explained how a bone marrow biopsy is done.  With Mariah, I would think that it should not be an issue since she is a slender woman.  I told Mariah that with a bone marrow biopsy, we could send the material off for multiple studies.  I just do not see any other way to figure out what might be going on with Mariah.  She agrees to have the bone marrow biopsy done.  She is going have a nice Christmas with Mariah family.  I am sure that she will enjoy this.  She has had no problems with nausea or vomiting.  She has had no change in bowel or bladder habits.  She has had no cough or shortness of breath.  There is been no rash.  She has had no bleeding.  Overall, I would say performance status is probably ECOG 1.     Wt Readings from Last 3 Encounters:  11/13/22 126 lb 12.8 oz (57.5 kg)  10/23/22 126 lb 1.3 oz (57.2 kg)  06/02/22 126 lb 4.8 oz (57.3 kg)    ECOG  Performance Status: 0 - Asymptomatic  Medications:  Allergies as of 11/13/2022   No Known Allergies      Medication List        Accurate as of November 13, 2022 10:31 AM. If you have any questions, ask your nurse or doctor.          STOP taking these medications    Fluad Quadrivalent 0.5 ML injection Generic drug: influenza vaccine adjuvanted Stopped by: Mariah Napoleon, MD       TAKE these medications    B COMPLEX-VITAMIN B12 PO Take 1 tablet by mouth daily.   CULTURELLE PROBIOTICS PO Take 1 capsule by mouth daily.   estradiol 0.1 MG/GM vaginal cream Commonly known as: ESTRACE VAGINAL Insert 1 gram every night for 3 weeks then insert 1 gram twice a week by vaginal route as directed for 30 days.   FISH OIL PO Take 1 capsule by mouth daily.   methocarbamol 500 MG tablet Commonly known as: ROBAXIN Take 1 tablet (500 mg total) by mouth at bedtime as needed.   omeprazole 20 MG capsule Commonly known as: PRILOSEC Take 1 capsule by mouth daily   TURMERIC PO Take 1,000 mg by mouth daily.   VITAMIN D PO Take  1 capsule by mouth daily.   Zinc 50 MG Tabs Take 1 tablet by mouth daily.        Allergies: No Known Allergies  Past Medical History, Surgical history, Social history, and Family History were reviewed and updated.  Review of Systems: Review of Systems  Constitutional:  Positive for malaise/fatigue.  HENT: Negative.    Eyes: Negative.   Respiratory: Negative.    Cardiovascular: Negative.   Gastrointestinal: Negative.   Genitourinary: Negative.   Musculoskeletal: Negative.   Skin: Negative.   Neurological: Negative.   Endo/Heme/Allergies: Negative.   Psychiatric/Behavioral: Negative.        Physical Exam:  height is _0  (1.626 m) and weight is 126 lb 12.8 oz (57.5 kg). Mariah oral temperature is 97.9 F (36.6 C). Mariah blood pressure is 149/87 (abnormal) and Mariah pulse is 90. Mariah respiration is 18 and oxygen saturation is 100%.   Wt  Readings from Last 3 Encounters:  11/13/22 126 lb 12.8 oz (57.5 kg)  10/23/22 126 lb 1.3 oz (57.2 kg)  06/02/22 126 lb 4.8 oz (57.3 kg)    Physical Exam Vitals reviewed.  HENT:     Head: Normocephalic and atraumatic.  Eyes:     Pupils: Pupils are equal, round, and reactive to light.  Cardiovascular:     Rate and Rhythm: Normal rate and regular rhythm.     Heart sounds: Normal heart sounds.  Pulmonary:     Effort: Pulmonary effort is normal.     Breath sounds: Normal breath sounds.  Abdominal:     General: Bowel sounds are normal.     Palpations: Abdomen is soft.  Musculoskeletal:        General: No tenderness or deformity. Normal range of motion.     Cervical back: Normal range of motion.  Lymphadenopathy:     Cervical: No cervical adenopathy.  Skin:    General: Skin is warm and dry.     Findings: No erythema or rash.  Neurological:     Mental Status: She is alert and oriented to person, place, and time.  Psychiatric:        Behavior: Behavior normal.        Thought Content: Thought content normal.        Judgment: Judgment normal.      Lab Results  Component Value Date   WBC 3.0 (L) 10/23/2022   HGB 14.9 10/23/2022   HCT 45.5 10/23/2022   MCV 90.6 10/23/2022   PLT 200 10/23/2022   No results found for: "FERRITIN", "IRON", "TIBC", "UIBC", "IRONPCTSAT" Lab Results  Component Value Date   RBC 5.02 10/23/2022   No results found for: "KPAFRELGTCHN", "LAMBDASER", "KAPLAMBRATIO" No results found for: "IGGSERUM", "IGA", "IGMSERUM" No results found for: "TOTALPROTELP", "ALBUMINELP", "A1GS", "A2GS", "BETS", "BETA2SER", "GAMS", "MSPIKE", "SPEI"   Chemistry      Component Value Date/Time   NA 143 10/23/2022 1128   K 4.6 10/23/2022 1128   CL 106 10/23/2022 1128   CO2 30 10/23/2022 1128   BUN 7 (L) 10/23/2022 1128   CREATININE 0.65 10/23/2022 1128   CREATININE 0.64 10/04/2021 0000      Component Value Date/Time   CALCIUM 10.0 10/23/2022 1128   ALKPHOS 37 (L)  10/23/2022 1128   AST 17 10/23/2022 1128   ALT 15 10/23/2022 1128   BILITOT 0.9 10/23/2022 1128       Impression and Plan: Mariah Everett is a 66 yo African American female with long history of chronic leukopenia felt to be  EAL.  However, on Mariah flow cytometry on the peripheral blood, there was a very minor population of T lymphocytes that we need to better evaluate.  I think a bone marrow biopsy will do this.  We were going to try to get this set up for early January.  I think this would be reasonable.  We will make sure that the aspirate is sent off for flow cytometry and cytogenetics.  Again, hopefully we are just dealing with Ethnic Associated Leukopenia.  However, we do need to be thorough with our evaluation.  I would like to get Mariah back about 2 weeks or so after the bone marrow test at which point I show back all the results so we can figure out if we need to intervene.     Mariah Napoleon, MD 12/21/202310:31 AM

## 2022-11-21 ENCOUNTER — Other Ambulatory Visit: Payer: Self-pay | Admitting: Radiology

## 2022-11-21 DIAGNOSIS — D708 Other neutropenia: Secondary | ICD-10-CM

## 2022-11-24 ENCOUNTER — Other Ambulatory Visit: Payer: Self-pay | Admitting: Student

## 2022-11-24 NOTE — H&P (Signed)
Chief Complaint: Patient was seen in consultation today for BMBX at the request of Ennever,Peter R  Referring Physician(s): Ennever,Peter R  Supervising Physician: Corrie Mckusick  Patient Status: [facility] - Out-pt  History of Present Illness: Mariah Everett is a 67 y.o. female with PMHs of C7 disc protrusion s/p discectomy and fusion in 2010, benign thyroid nodule and chronic leukopenia who presents for BMBx.   Patient was referred to hem/onc on March 2023 due to leukocytopenia, peripheral smear was unremarkable. BMBx was recommended to the patient for further evaluation which she decided to proceed with.   IR was requested for image guided bone marrow biopsy and aspiration.   Patient laying in bed, not in acute distress, spouse at bedside.  Denise headache, fever, chills, shortness of breath, cough, chest pain, abdominal pain, nausea ,vomiting, and bleeding.   Past Medical History:  Diagnosis Date   CERUMEN IMPACTION, BILATERAL 09/03/2009   Qualifier: Diagnosis of  By: Royal Piedra NP, Tammy     CERVICAL RADICULOPATHY 12/15/2008   - MRI 12/22/2008 Pos C7 disc protrusion > Discectomy and fusion 05/18/2009 [facility] [place]      Chronic benign neutropenia ([place])    Esophageal dysmotility    Loss of weight 08/12/2016   Neck pain    radicular C7/8 right. MRI 12-22-18 pos C7 disc protrusion   Preventative health care 11/03/2013   Followed as Primary Care Patient/ [facility]/ [facility]     Rectal bleeding 07/11/2011   Negative colonoscopy 09/12/11  Dr Genelle Gather    Sciatica of left side 11/10/2014   Followed as Primary Care Patient/ [facility]/ [facility] > referred  Back to [facility]     Shoulder pain    Thyroid nodule    a. Bx/TFTs normal by prior workup, clinically benign.    Past Surgical History:  Procedure Laterality Date   BIOPSY THYROID     CERVICAL DISCECTOMY  05-18-2009   CERVICAL FUSION  05-18-2009   HEMORROIDECTOMY     1990   LEFT HEART CATHETERIZATION WITH CORONARY  ANGIOGRAM N/A 05/19/2014   Procedure: LEFT HEART CATHETERIZATION WITH CORONARY ANGIOGRAM;  Surgeon: Jolaine Artist, MD;  Location: [facility] CATH LAB;  Service: Cardiovascular;  Laterality: N/A;    Allergies: Patient has no known allergies.  Medications: Prior to Admission medications   Medication Sig Start Date End Date Taking? Authorizing Provider  B Complex-Folic Acid (B COMPLEX-VITAMIN B12 PO) Take 1 tablet by mouth daily.    [provider]  estradiol (ESTRACE VAGINAL) 0.1 MG/GM vaginal cream Insert 1 gram every night for 3 weeks then insert 1 gram twice a week by vaginal route as directed for 30 days. Patient not taking: Reported on 11/13/2022 10/29/22     methocarbamol (ROBAXIN) 500 MG tablet Take 1 tablet (500 mg total) by mouth at bedtime as needed. Patient not taking: Reported on 10/23/2022 06/04/22     Omega-3 Fatty Acids (FISH OIL PO) Take 1 capsule by mouth daily.    [provider]  omeprazole (PRILOSEC) 20 MG capsule Take 1 capsule by mouth daily 09/06/21     Probiotic Product (CULTURELLE PROBIOTICS PO) Take 1 capsule by mouth daily.    [provider]  TURMERIC PO Take 1,000 mg by mouth daily.    [provider]  VITAMIN D PO Take 1 capsule by mouth daily.    [provider]  Zinc 50 MG TABS Take 1 tablet by mouth daily. Patient not taking: Reported on 10/23/2022    [provider]     Family History  Problem Relation Age of Onset   Ovarian cancer Mother    Lung cancer Mother    Cervical cancer Mother    High blood pressure Mother    Stroke Mother    Throat cancer Father    Lupus Sister    High blood pressure Sister    Diabetes Son    High blood pressure Other     Social History   Socioeconomic History   Marital status: Married    Spouse name: Rayley Gao   Number of children: 1   Years of education: Not on file   Highest education level: Some college, no degree  Occupational History   Occupation:  insurance   Occupation: Patient Insurance account manager: [employer]  Tobacco Use   Smoking status: Never   Smokeless tobacco: Never  Vaping Use   Vaping Use: Never used  Substance and Sexual Activity   Alcohol use: Not Currently    Comment: occ   Drug use: No   Sexual activity: Yes    Partners: Male    Birth control/protection: None, Post-menopausal  Other Topics Concern   Not on file  Social History Narrative   Not on file   Social Determinants of Health   Financial Resource Strain: Low Risk  (08/31/2020)   Overall Financial Resource Strain (CARDIA)    Difficulty of Paying Living Expenses: Not hard at all  Food Insecurity: No Food Insecurity (08/31/2020)   Hunger Vital Sign    Worried About Running Out of Food in the Last Year: Never true    Ran Out of Food in the Last Year: Never true  Transportation Needs: No Transportation Needs (08/31/2020)   PRAPARE - Hydrologist (Medical): No    Lack of Transportation (Non-Medical): No  Physical Activity: Insufficiently Active (08/31/2020)   Exercise Vital Sign    Days of Exercise per Week: 4 days    Minutes of Exercise per Session: 20 min  Stress: No Stress Concern Present (08/31/2020)   [place]    Feeling of Stress : Not at all  Social Connections: [place] (08/31/2020)   Social Connection and Isolation Panel [NHANES]    Frequency of Communication with Friends and Family: Twice a week    Frequency of Social Gatherings with Friends and Family: More than three times a week    Attends Religious Services: 1 to 4 times per year    Active Member of [employer] or Organizations: Yes    Attends Archivist Meetings: 1 to 4 times per year    Marital Status: Married     Review of Systems: A 12 point ROS discussed and pertinent positives are indicated in the HPI above.  All other systems are negative.  Vital  Signs: BP (!) 156/98 (BP Location: Right Arm)   Pulse 88   Temp 98.1 F (36.7 C) (Oral)   Resp 16   SpO2 100%   Physical Exam Vitals and nursing note reviewed.  Constitutional:      General: Patient is not in acute distress.    Appearance: Normal appearance. Patient is not ill-appearing.  HENT:     Head: Normocephalic and atraumatic.     Mouth/Throat:     Mouth: Mucous membranes are moist.     Pharynx: Oropharynx is clear.  Cardiovascular:     Rate and Rhythm: Normal rate and  regular rhythm.     Pulses: Normal pulses.     Heart sounds: Normal heart sounds.  Pulmonary:     Effort: Pulmonary effort is normal.     Breath sounds: Normal breath sounds.  Abdominal:     General: Abdomen is flat. Bowel sounds are normal.     Palpations: Abdomen is soft.  Musculoskeletal:     Cervical back: Neck supple.  Skin:    General: Skin is warm and dry.     Coloration: Skin is not jaundiced or pale.  Neurological:     Mental Status: Patient is alert and oriented to person, place, and time.  Psychiatric:        Mood and Affect: Mood normal.        Behavior: Behavior normal.        Judgment: Judgment normal.     MD Evaluation Airway: WNL Heart: WNL Abdomen: WNL Chest/ Lungs: WNL ASA  Classification: 2 Mallampati/Airway Score: Two  Imaging: No results found.  Labs:  CBC: Recent Labs    01/30/22 1046 06/02/22 0913 10/23/22 1128 11/25/22 0719  WBC 3.0* 3.4* 3.0* 3.2*  HGB 15.2* 15.1* 14.9 14.0  HCT 44.6 45.8 45.5 43.0  PLT 201 224 200 183    COAGS: No results for input(s): "INR", "APTT" in the last 8760 hours.  BMP: Recent Labs    06/02/22 0913 10/23/22 1128  NA 139 143  K 3.8 4.6  CL 104 106  CO2 28 30  GLUCOSE 121* 101*  BUN 8 7*  CALCIUM 9.9 10.0  CREATININE 0.63 0.65  GFRNONAA >60 >60    LIVER FUNCTION TESTS: Recent Labs    06/02/22 0913 10/23/22 1128  BILITOT 0.6 0.9  AST 17 17  ALT 13 15  ALKPHOS 41 37*  PROT 7.2 7.1  ALBUMIN 5.0 5.1*     TUMOR MARKERS: No results for input(s): "AFPTM", "CEA", "CA199", "CHROMGRNA" in the last 8760 hours.  Assessment and Plan: 68 y.o. female with chronic leukocytosis who is in need of BMBx.   NPO since MN VSS CBC with diff stable Not on AC/AP NKDA  Risks and benefits of BMBx was discussed with the patient and/or patient's family including, but not limited to bleeding, infection, damage to adjacent structures or low yield requiring additional tests.  All of the questions were answered and there is agreement to proceed.  Consent signed and in chart.    Thank you for this interesting consult.  I greatly enjoyed meeting RASHAWNA SCOLES and look forward to participating in their care.  A copy of this report was sent to the requesting provider on this date.  Electronically Signed: Tera Mater, PA-C 11/25/2022, 8:27 AM   I spent a total of  30 Minutes   in face to face in clinical consultation, greater than 50% of which was counseling/coordinating care for BMBx.   This chart was dictated using voice recognition software.  Despite best efforts to proofread,  errors can occur which can change the documentation meaning.

## 2022-11-25 ENCOUNTER — Ambulatory Visit (HOSPITAL_COMMUNITY)
Admission: RE | Admit: 2022-11-25 | Discharge: 2022-11-25 | Disposition: A | Discharge status: Home / Self Care | Payer: Medicare Other | Source: Ambulatory Visit | Attending: Hematology & Oncology | Admitting: Hematology & Oncology

## 2022-11-25 ENCOUNTER — Encounter (HOSPITAL_COMMUNITY): Payer: Self-pay

## 2022-11-25 DIAGNOSIS — Z1379 Encounter for other screening for genetic and chromosomal anomalies: Secondary | ICD-10-CM | POA: Diagnosis not present

## 2022-11-25 DIAGNOSIS — Z0389 Encounter for observation for other suspected diseases and conditions ruled out: Secondary | ICD-10-CM | POA: Diagnosis not present

## 2022-11-25 DIAGNOSIS — D72829 Elevated white blood cell count, unspecified: Secondary | ICD-10-CM | POA: Diagnosis not present

## 2022-11-25 DIAGNOSIS — D708 Other neutropenia: Secondary | ICD-10-CM | POA: Insufficient documentation

## 2022-11-25 LAB — CBC WITH DIFFERENTIAL/PLATELET
Abs Immature Granulocytes: 0 10*3/uL (ref 0.00–0.07)
Basophils Absolute: 0 10*3/uL (ref 0.0–0.1)
Basophils Relative: 0 %
Eosinophils Absolute: 0 10*3/uL (ref 0.0–0.5)
Eosinophils Relative: 1 %
HCT: 43 % (ref 36.0–46.0)
Hemoglobin: 14 g/dL (ref 12.0–15.0)
Immature Granulocytes: 0 %
Lymphocytes Relative: 75 %
Lymphs Abs: 2.4 10*3/uL (ref 0.7–4.0)
MCH: 29.2 pg (ref 26.0–34.0)
MCHC: 32.6 g/dL (ref 30.0–36.0)
MCV: 89.8 fL (ref 80.0–100.0)
Monocytes Absolute: 0.2 10*3/uL (ref 0.1–1.0)
Monocytes Relative: 6 %
Neutro Abs: 0.6 10*3/uL — ABNORMAL LOW (ref 1.7–7.7)
Neutrophils Relative %: 18 %
Platelets: 183 10*3/uL (ref 150–400)
RBC: 4.79 MIL/uL (ref 3.87–5.11)
RDW: 12.4 % (ref 11.5–15.5)
WBC: 3.2 10*3/uL — ABNORMAL LOW (ref 4.0–10.5)
nRBC: 0 % (ref 0.0–0.2)

## 2022-11-25 MED ORDER — MIDAZOLAM HCL 2 MG/2ML IJ SOLN
INTRAMUSCULAR | Status: AC | PRN
  Administered 2022-11-25 (×2): 1 mg via INTRAVENOUS

## 2022-11-25 MED ORDER — FENTANYL CITRATE (PF) 100 MCG/2ML IJ SOLN
INTRAMUSCULAR | Status: AC | PRN
  Administered 2022-11-25 (×2): 50 ug via INTRAVENOUS

## 2022-11-25 MED ORDER — MIDAZOLAM HCL 2 MG/2ML IJ SOLN
INTRAMUSCULAR | Status: AC
  Filled 2022-11-25: qty 4

## 2022-11-25 MED ORDER — SODIUM CHLORIDE 0.9 % IV SOLN: INTRAVENOUS | Status: DC

## 2022-11-25 MED ORDER — FENTANYL CITRATE (PF) 100 MCG/2ML IJ SOLN
INTRAMUSCULAR | Status: AC
  Filled 2022-11-25: qty 2

## 2022-11-25 MED ORDER — LIDOCAINE HCL (PF) 1 % IJ SOLN
INTRAMUSCULAR | Status: AC | PRN
  Administered 2022-11-25: 20 mL

## 2022-11-25 NOTE — Procedures (Signed)
Interventional Radiology Procedure Note  Procedure: CT guided aspirate and core biopsy of right posterior iliac bone Complications: None Recommendations: - Bedrest supine x 1 hrs - OTC's PRN  Pain - Follow biopsy results  Signed,  Janaiya Beauchesne S. Sundra Haddix, DO    

## 2022-11-28 LAB — SURGICAL PATHOLOGY

## 2022-12-01 ENCOUNTER — Inpatient Hospital Stay: Payer: Medicare Other

## 2022-12-01 ENCOUNTER — Inpatient Hospital Stay: Payer: Medicare Other | Admitting: Hematology & Oncology

## 2022-12-01 ENCOUNTER — Ambulatory Visit: Payer: Medicare Other | Admitting: Family

## 2022-12-03 ENCOUNTER — Encounter (HOSPITAL_COMMUNITY): Payer: Self-pay | Admitting: Hematology & Oncology

## 2022-12-03 DIAGNOSIS — M25512 Pain in left shoulder: Secondary | ICD-10-CM | POA: Diagnosis not present

## 2022-12-05 DIAGNOSIS — R2689 Other abnormalities of gait and mobility: Secondary | ICD-10-CM | POA: Diagnosis not present

## 2022-12-05 DIAGNOSIS — D72819 Decreased white blood cell count, unspecified: Secondary | ICD-10-CM | POA: Diagnosis not present

## 2022-12-05 DIAGNOSIS — R0789 Other chest pain: Secondary | ICD-10-CM | POA: Diagnosis not present

## 2022-12-05 DIAGNOSIS — F419 Anxiety disorder, unspecified: Secondary | ICD-10-CM | POA: Diagnosis not present

## 2022-12-08 DIAGNOSIS — M7522 Bicipital tendinitis, left shoulder: Secondary | ICD-10-CM | POA: Diagnosis not present

## 2022-12-08 DIAGNOSIS — M67814 Other specified disorders of tendon, left shoulder: Secondary | ICD-10-CM | POA: Diagnosis not present

## 2022-12-08 DIAGNOSIS — M75102 Unspecified rotator cuff tear or rupture of left shoulder, not specified as traumatic: Secondary | ICD-10-CM | POA: Diagnosis not present

## 2022-12-15 ENCOUNTER — Inpatient Hospital Stay: Payer: Medicare Other | Attending: Hematology & Oncology

## 2022-12-15 ENCOUNTER — Other Ambulatory Visit: Payer: Self-pay

## 2022-12-15 ENCOUNTER — Inpatient Hospital Stay: Payer: Medicare Other | Admitting: Hematology & Oncology

## 2022-12-15 ENCOUNTER — Encounter: Payer: Self-pay | Admitting: Hematology & Oncology

## 2022-12-15 VITALS — BP 135/84 | HR 111 | Temp 98.6°F | Resp 18 | Ht 64.0 in | Wt 127.0 lb

## 2022-12-15 DIAGNOSIS — Z79899 Other long term (current) drug therapy: Secondary | ICD-10-CM | POA: Insufficient documentation

## 2022-12-15 DIAGNOSIS — R5383 Other fatigue: Secondary | ICD-10-CM | POA: Insufficient documentation

## 2022-12-15 DIAGNOSIS — D708 Other neutropenia: Secondary | ICD-10-CM | POA: Diagnosis not present

## 2022-12-15 DIAGNOSIS — C91Z0 Other lymphoid leukemia not having achieved remission: Secondary | ICD-10-CM | POA: Insufficient documentation

## 2022-12-15 LAB — CBC WITH DIFFERENTIAL (CANCER CENTER ONLY)
Abs Immature Granulocytes: 0 10*3/uL (ref 0.00–0.07)
Basophils Absolute: 0 10*3/uL (ref 0.0–0.1)
Basophils Relative: 0 %
Eosinophils Absolute: 0 10*3/uL (ref 0.0–0.5)
Eosinophils Relative: 0 %
HCT: 42.2 % (ref 36.0–46.0)
Hemoglobin: 14.1 g/dL (ref 12.0–15.0)
Immature Granulocytes: 0 %
Lymphocytes Relative: 49 %
Lymphs Abs: 1.6 10*3/uL (ref 0.7–4.0)
MCH: 29.9 pg (ref 26.0–34.0)
MCHC: 33.4 g/dL (ref 30.0–36.0)
MCV: 89.4 fL (ref 80.0–100.0)
Monocytes Absolute: 0.2 10*3/uL (ref 0.1–1.0)
Monocytes Relative: 5 %
Neutro Abs: 1.5 10*3/uL — ABNORMAL LOW (ref 1.7–7.7)
Neutrophils Relative %: 46 %
Platelet Count: 196 10*3/uL (ref 150–400)
RBC: 4.72 MIL/uL (ref 3.87–5.11)
RDW: 12.3 % (ref 11.5–15.5)
WBC Count: 3.3 10*3/uL — ABNORMAL LOW (ref 4.0–10.5)
nRBC: 0 % (ref 0.0–0.2)

## 2022-12-15 LAB — LACTATE DEHYDROGENASE: LDH: 160 U/L (ref 98–192)

## 2022-12-15 LAB — CMP (CANCER CENTER ONLY)
ALT: 15 U/L (ref 0–44)
AST: 15 U/L (ref 15–41)
Albumin: 4.7 g/dL (ref 3.5–5.0)
Alkaline Phosphatase: 37 U/L — ABNORMAL LOW (ref 38–126)
Anion gap: 7 (ref 5–15)
BUN: 8 mg/dL (ref 8–23)
CO2: 31 mmol/L (ref 22–32)
Calcium: 10.1 mg/dL (ref 8.9–10.3)
Chloride: 104 mmol/L (ref 98–111)
Creatinine: 0.67 mg/dL (ref 0.44–1.00)
GFR, Estimated: >60 mL/min (ref >60)
Glucose, Bld: 132 mg/dL — ABNORMAL HIGH (ref 70–99)
Potassium: 4 mmol/L (ref 3.5–5.1)
Sodium: 142 mmol/L (ref 135–145)
Total Bilirubin: 0.6 mg/dL (ref 0.3–1.2)
Total Protein: 7 g/dL (ref 6.5–8.1)

## 2022-12-15 LAB — SAVE SMEAR(SSMR), FOR PROVIDER SLIDE REVIEW

## 2022-12-15 NOTE — Progress Notes (Signed)
Hematology and Oncology Follow Up Visit  Mariah Everett [id number] [birth year] 67 y.o. 12/15/2022   Principle Diagnosis:  Ethnic associated leukopenia /LGL Leukemia (?)  Current Therapy:   Observation   Interim History:  Mariah Everett is here today for follow-up.  We did do a bone marrow biopsy on her.  This was done on 11/25/2022.  The pathology report [facility] - S24 - [id number]) showed a hypercellular marrow with trilineage hematopoiesis.  There was less than 5% abnormal T lymphocytes.  They had dim expression of CD3 and CD5.  It was felt that this would likely correlate with the large granular lymphocytes seen on the flow cytometry.  At this point in time, I just do not think there is anything that we need to do about this.  Her white cell count has been relatively stable.  White cell count is on the lower side.  Her neutrophils tend to fluctuate.  She does not need any treatment.  I do not think we have to send off for any additional studies right now.  She feels okay.  She and her husband will be going to [place] I think in February for a conference.  She has had no fever.  She has had no problems with COVID.  She has had no issues with nausea or vomiting.  She has had no change in bowel or bladder habits.  She has had no rashes.  She is still very diligent with respect to avoiding COVID and Influenza.  Currently, I would have to say that her performance status is probably ECOG 0.       Wt Readings from Last 3 Encounters:  11/13/22 126 lb 12.8 oz (57.5 kg)  10/23/22 126 lb 1.3 oz (57.2 kg)  06/02/22 126 lb 4.8 oz (57.3 kg)    Medications:  Allergies as of 12/15/2022   No Known Allergies      Medication List        Accurate as of December 15, 2022  3:15 PM. If you have any questions, ask your nurse or doctor.          B COMPLEX-VITAMIN B12 PO Take 1 tablet by mouth daily.   cetirizine 10 MG tablet Commonly known as: ZYRTEC Take 10 mg by mouth daily.   CULTURELLE  PROBIOTICS PO Take 1 capsule by mouth daily.   estradiol 0.1 MG/GM vaginal cream Commonly known as: ESTRACE VAGINAL Insert 1 gram every night for 3 weeks then insert 1 gram twice a week by vaginal route as directed for 30 days.   FISH OIL PO Take 1 capsule by mouth daily.   methocarbamol 500 MG tablet Commonly known as: ROBAXIN Take 1 tablet (500 mg total) by mouth at bedtime as needed.   omeprazole 20 MG capsule Commonly known as: PRILOSEC Take 1 capsule by mouth daily   TURMERIC PO Take 1,000 mg by mouth daily.   VITAMIN D PO Take 1 capsule by mouth daily.   Zinc 50 MG Tabs Take 1 tablet by mouth daily.        Allergies: No Known Allergies  Past Medical History, Surgical history, Social history, and Family History were reviewed and updated.  Review of Systems: Review of Systems  Constitutional:  Positive for malaise/fatigue.  HENT: Negative.    Eyes: Negative.   Respiratory: Negative.    Cardiovascular: Negative.   Gastrointestinal: Negative.   Genitourinary: Negative.   Musculoskeletal: Negative.   Skin: Negative.   Neurological: Negative.   Endo/Heme/Allergies: Negative.  Psychiatric/Behavioral: Negative.        Physical Exam: Vital signs show a temperature of 98.6.  Pulse 111.  Blood pressure 135/84.  Weight is 127 pounds.  Wt Readings from Last 3 Encounters:  11/13/22 126 lb 12.8 oz (57.5 kg)  10/23/22 126 lb 1.3 oz (57.2 kg)  06/02/22 126 lb 4.8 oz (57.3 kg)    Physical Exam Vitals reviewed.  HENT:     Head: Normocephalic and atraumatic.  Eyes:     Pupils: Pupils are equal, round, and reactive to light.  Cardiovascular:     Rate and Rhythm: Normal rate and regular rhythm.     Heart sounds: Normal heart sounds.  Pulmonary:     Effort: Pulmonary effort is normal.     Breath sounds: Normal breath sounds.  Abdominal:     General: Bowel sounds are normal.     Palpations: Abdomen is soft.  Musculoskeletal:        General: No tenderness or  deformity. Normal range of motion.     Cervical back: Normal range of motion.  Lymphadenopathy:     Cervical: No cervical adenopathy.  Skin:    General: Skin is warm and dry.     Findings: No erythema or rash.  Neurological:     Mental Status: She is alert and oriented to person, place, and time.  Psychiatric:        Behavior: Behavior normal.        Thought Content: Thought content normal.        Judgment: Judgment normal.     Lab Results  Component Value Date   WBC 3.3 (L) 12/15/2022   HGB 14.1 12/15/2022   HCT 42.2 12/15/2022   MCV 89.4 12/15/2022   PLT 196 12/15/2022   No results found for: "FERRITIN", "IRON", "TIBC", "UIBC", "IRONPCTSAT" Lab Results  Component Value Date   RBC 4.72 12/15/2022   No results found for: "KPAFRELGTCHN", "LAMBDASER", "KAPLAMBRATIO" No results found for: "IGGSERUM", "IGA", "IGMSERUM" No results found for: "TOTALPROTELP", "ALBUMINELP", "A1GS", "A2GS", "BETS", "BETA2SER", "GAMS", "MSPIKE", "SPEI"   Chemistry      Component Value Date/Time   NA 143 10/23/2022 1128   K 4.6 10/23/2022 1128   CL 106 10/23/2022 1128   CO2 30 10/23/2022 1128   BUN 7 (L) 10/23/2022 1128   CREATININE 0.65 10/23/2022 1128   CREATININE 0.64 10/04/2021 0000      Component Value Date/Time   CALCIUM 10.0 10/23/2022 1128   ALKPHOS 37 (L) 10/23/2022 1128   AST 17 10/23/2022 1128   ALT 15 10/23/2022 1128   BILITOT 0.9 10/23/2022 1128       Impression and Plan: Mariah Everett is a 67 yo African American female with long history of chronic leukopenia felt to be EAL.  However, there is a minor monoclonal population of T lymphocytes.  This will have to be followed.  At less than 5% on the bone marrow biopsy, there is no indication that we have to do anything right now.  I forgot to mention that we did run the cytogenetics on her bone marrow.  The cytogenetics were normal.  The FISH studies were also normal.  I think that we does have to follow her along.  I would like  to have her come back in April.  If everything looks good in April, then maybe we can get her through the Summer.  I am try to reassure her she and her husband that for right now we do not have to  make any changes with our protocol for observation.  I do still see that we have to intervene with therapy that would have more toxicity than the actual problem.   Volanda Napoleon, MD 1/22/20243:15 PM

## 2022-12-18 ENCOUNTER — Ambulatory Visit: Payer: Medicare Other | Admitting: Cardiology

## 2022-12-18 ENCOUNTER — Encounter: Payer: Self-pay | Admitting: Cardiology

## 2022-12-18 ENCOUNTER — Other Ambulatory Visit (HOSPITAL_BASED_OUTPATIENT_CLINIC_OR_DEPARTMENT_OTHER): Payer: Self-pay

## 2022-12-18 VITALS — BP 125/88 | HR 96 | Resp 16 | Ht 64.0 in | Wt 127.0 lb

## 2022-12-18 DIAGNOSIS — R072 Precordial pain: Secondary | ICD-10-CM

## 2022-12-18 MED ORDER — METOPROLOL TARTRATE 50 MG PO TABS
50.0000 mg | ORAL_TABLET | ORAL | 1 refills | Status: DC
  Filled 2022-12-18: qty 10, 3d supply, fill #0

## 2022-12-18 MED ORDER — METOPROLOL TARTRATE 50 MG PO TABS
50.0000 mg | ORAL_TABLET | ORAL | 1 refills | Status: DC
  Filled 2022-12-18: qty 10, fill #0

## 2022-12-18 NOTE — Progress Notes (Signed)
Patient referred by Audley Hose, MD for chest pain  Subjective:   Mariah Everett, female    DOB: 11-05-1956, 67 y.o.   MRN: [id number]   Chief Complaint  Patient presents with   Chest Pain   New Patient (Initial Visit)    HPI  67 y.o. African American female with chest pain  Patient has had central chest heaviness, lasting for several hours at a time, unrelated to exertion. Her bigger concerns are with regards to her gait and balance. She underwent coronary angiography in 2-15 for somewhat similar symptoms that showed normal coronaries.    Past Medical History:  Diagnosis Date   CERUMEN IMPACTION, BILATERAL 09/03/2009   Qualifier: Diagnosis of  By: Royal Piedra NP, Tammy     CERVICAL RADICULOPATHY 12/15/2008   - MRI 12/22/2008 Pos C7 disc protrusion > Discectomy and fusion 05/18/2009 [facility] [place]      Chronic benign neutropenia ([place])    Esophageal dysmotility    Loss of weight 08/12/2016   Neck pain    radicular C7/8 right. MRI 12-22-18 pos C7 disc protrusion   Preventative health care 11/03/2013   Followed as Primary Care Patient/ [facility]/ [facility]     Rectal bleeding 07/11/2011   Negative colonoscopy 09/12/11  Dr Genelle Gather    Sciatica of left side 11/10/2014   Followed as Primary Care Patient/ [facility]/ [facility] > referred  Back to [facility]     Shoulder pain    Thyroid nodule    a. Bx/TFTs normal by prior workup, clinically benign.     Past Surgical History:  Procedure Laterality Date   BIOPSY THYROID     CERVICAL DISCECTOMY  05-18-2009   CERVICAL FUSION  05-18-2009   HEMORROIDECTOMY     1990   LEFT HEART CATHETERIZATION WITH CORONARY ANGIOGRAM N/A 05/19/2014   Procedure: LEFT HEART CATHETERIZATION WITH CORONARY ANGIOGRAM;  Surgeon: Jolaine Artist, MD;  Location: [facility] CATH LAB;  Service: Cardiovascular;  Laterality: N/A;     Social History   Tobacco Use  Smoking Status Never  Smokeless Tobacco Never    Social History   Substance and  Sexual Activity  Alcohol Use Not Currently   Comment: occ     Family History  Problem Relation Age of Onset   Ovarian cancer Mother    Lung cancer Mother    Cervical cancer Mother    High blood pressure Mother    Stroke Mother    Throat cancer Father    Lupus Sister    High blood pressure Sister    Diabetes Son    High blood pressure Other       Current Outpatient Medications:    aspirin EC 81 MG tablet, Take 81 mg by mouth daily. Swallow whole., Disp: , Rfl:    B Complex-Folic Acid (B COMPLEX-VITAMIN B12 PO), Take 1 tablet by mouth daily., Disp: , Rfl:    cetirizine (ZYRTEC) 10 MG tablet, Take 10 mg by mouth daily., Disp: , Rfl:    estradiol (ESTRACE VAGINAL) 0.1 MG/GM vaginal cream, Insert 1 gram every night for 3 weeks then insert 1 gram twice a week by vaginal route as directed for 30 days., Disp: 42.5 g, Rfl: 6   methocarbamol (ROBAXIN) 500 MG tablet, Take 1 tablet (500 mg total) by mouth at bedtime as needed., Disp: 20 tablet, Rfl: 0   omeprazole (PRILOSEC) 20 MG capsule, Take 1 capsule by mouth daily, Disp: 30 capsule, Rfl: 6   Probiotic Product (  CULTURELLE PROBIOTICS PO), Take 1 capsule by mouth daily., Disp: , Rfl:    TURMERIC PO, Take 1,000 mg by mouth daily., Disp: , Rfl:    VITAMIN D PO, Take 1 capsule by mouth daily., Disp: , Rfl:    Zinc 50 MG TABS, Take 1 tablet by mouth daily., Disp: , Rfl:    Cardiovascular and other pertinent studies:  Reviewed external labs and tests, independently interpreted  EKG 12/18/2022: Sinus rhythm 99 bpm Nonspecific diffuse ST depression    Recent labs: 12/15/2022: Glucose 132, BUN/Cr 8/0.67. EGFR >60. Na/K 142/4.0. Rest of the CMP normal H/H 14/42. MCV 89. Platelets 196   09/2021: Chol 199, TG 58, HDL 83, LDL 102 TSH 1.3 normal  08/2020: HbA1C 5.2%    Review of Systems  Cardiovascular:  Positive for chest pain. Negative for dyspnea on exertion, leg swelling, palpitations and syncope.  Neurological:  Positive for  loss of balance.         Vitals:   12/18/22 0937  BP: 125/88  Pulse: 96  Resp: 16  SpO2: 100%     Body mass index is 21.8 kg/m. Filed Weights   12/18/22 0937  Weight: 127 lb (57.6 kg)     Objective:   Physical Exam Vitals and nursing note reviewed.  Constitutional:      General: She is not in acute distress. Neck:     Vascular: No JVD.  Cardiovascular:     Rate and Rhythm: Normal rate and regular rhythm.     Heart sounds: Normal heart sounds. No murmur heard. Pulmonary:     Effort: Pulmonary effort is normal.     Breath sounds: Normal breath sounds. No wheezing or rales.  Musculoskeletal:     Right lower leg: No edema.     Left lower leg: No edema.         Visit diagnoses:   ICD-10-CM   1. Precordial pain  R07.2 EKG 12-Lead    CT CORONARY MORPH W/CTA COR W/SCORE W/CA W/CM &/OR WO/CM    metoprolol tartrate (LOPRESSOR) 50 MG tablet    DISCONTINUED: metoprolol tartrate (LOPRESSOR) 50 MG tablet       Orders Placed This Encounter  Procedures   CT CORONARY MORPH W/CTA COR W/SCORE W/CA W/CM &/OR WO/CM   EKG 12-Lead     Medication changes this visit: There are no discontinued medications.  Meds ordered this encounter  Medications   DISCONTD: metoprolol tartrate (LOPRESSOR) 50 MG tablet    Sig: Take 1 tablet (50 mg total) by mouth as directed for 10 days. 100 mg (2 tabs) 2 hours before CT scan 50 mg (1 tab) twice daily for two days before CT scan    Dispense:  10 tablet    Refill:  1   metoprolol tartrate (LOPRESSOR) 50 MG tablet    Sig: Take 1 tablet (50 mg) by mouth twice daily for two days before CT scan, then take 2 tablets by mouth (100 mg) 2 hours before CT scan.    Dispense:  10 tablet    Refill:  1     Assessment & Recommendations:    67 y.o. African American female with chest pain  Precordial pain: Less likely to be anginal. Patient is very concerned about cardiac etiology. I think a CCTA will be the most definitive test here, along with  echocardiogram. If no calcium or CAD, can stop Aspirin. If calcium sore elevated, will consider Aspirin.   Further recommendations after above testing.   Thank you  for referring the patient to [place]. Please feel free to contact with any questions.   Nigel Mormon, MD Pager: [phone] Office: [phone]

## 2022-12-18 NOTE — Patient Instructions (Signed)
Your cardiac CT will be scheduled at the locations below:   [facility]  [address]  [place], [place] [address]  [phone]    If scheduled at [facility], please arrive at the [facility] main entrance of [facility] 30-45 minutes prior to test start time.  Proceed to the [facility] Radiology Department (first floor) to check-in and test prep.   Please follow these instructions carefully (unless otherwise directed):   On the Night Before the Test:   Be sure to Drink plenty of water.   Do not consume any caffeinated/decaffeinated beverages or chocolate 12 hours prior to your test.   Do not take any antihistamines 12 hours prior to your test.   If the patient has contrast allergy:  Patient will need a prescription for Prednisone and very clear instructions (as follows):  1. Prednisone 50 mg - take 13 hours prior to test  2. Take another Prednisone 50 mg 7 hours prior to test  3. Take another Prednisone 50 mg 1 hour prior to test  4. Take Benadryl 50 mg 1 hour prior to test   Patient must complete all four doses of above prophylactic medications.   Patient will need a ride after test due to Benadryl.   On the Day of the Test:   Drink plenty of water. Do not drink any water within one hour of the test.   Do not eat any food 4 hours prior to the test.   You may take your regular medications prior to the test.   - Metoprolol tartarate 100 mg 2 hours before CT scan 50 mg twice daily for two days before CT scan You may stop it after the CT scan, unless specified otherwise by me.    FEMALES- please wear underwire-free bra if available          After the Test:   Drink plenty of water.   After receiving IV contrast, you may experience a mild flushed feeling. This is normal.   On occasion, you may experience a mild rash up to 24 hours after the test. This is not dangerous. If this occurs, you can take Benadryl 25 mg and increase your  fluid intake.   If you experience trouble breathing, this can be serious. If it is severe call 911 IMMEDIATELY. If it is mild, please call our office.   If you take any of these medications: Glipizide/Metformin, Avandament, Glucavance, please do not take 48 hours after completing test unless otherwise instructed.     Please contact the cardiac imaging nurse navigator should you have any questions/concerns  Marchia Bond, RN Navigator Cardiac Imaging  [place] [facility]  [phone] Office  [phone] Cell

## 2023-01-13 ENCOUNTER — Telehealth (HOSPITAL_COMMUNITY): Payer: Self-pay | Admitting: *Deleted

## 2023-01-13 NOTE — Telephone Encounter (Signed)
Patient returning call regarding her upcoming cardiac imaging study; pt verbalizes understanding of appt date/time, parking situation and where to check in, pre-test NPO status and medications ordered, and verified current allergies; name and call back number provided for further questions should they arise  Gordy Clement RN Navigator Cardiac [facility] [phone] office [phone] cell  Patient to take 148m metoprolol tartrate two hours prior to her cardiac CT scan. She is aware to arrive at 10:30am.

## 2023-01-13 NOTE — Telephone Encounter (Signed)
Attempted to call patient regarding upcoming cardiac CT appointment. °Left message on voicemail with name and callback number ° °Denson Niccoli RN Navigator Cardiac Imaging °[facility] °[phone] Office °[phone] Cell ° °

## 2023-01-14 ENCOUNTER — Ambulatory Visit (HOSPITAL_COMMUNITY)
Admission: RE | Admit: 2023-01-14 | Discharge: 2023-01-14 | Disposition: A | Discharge status: Home / Self Care | Payer: Medicare Other | Source: Ambulatory Visit | Attending: Internal Medicine | Admitting: Internal Medicine

## 2023-01-14 DIAGNOSIS — R072 Precordial pain: Secondary | ICD-10-CM | POA: Insufficient documentation

## 2023-01-14 DIAGNOSIS — I700 Atherosclerosis of aorta: Secondary | ICD-10-CM | POA: Diagnosis not present

## 2023-01-14 MED ORDER — NITROGLYCERIN 0.4 MG SL SUBL: 0.8000 mg | SUBLINGUAL_TABLET | SUBLINGUAL | Status: DC | PRN

## 2023-01-14 MED ORDER — NITROGLYCERIN 0.4 MG SL SUBL
SUBLINGUAL_TABLET | SUBLINGUAL | Status: AC
  Administered 2023-01-14: 0.8 mg via SUBLINGUAL
  Filled 2023-01-14: qty 2

## 2023-01-14 MED ORDER — IOHEXOL 350 MG/ML SOLN
100.0000 mL | Freq: Once | INTRAVENOUS | Status: AC | PRN
  Administered 2023-01-14: 100 mL via INTRAVENOUS

## 2023-01-23 ENCOUNTER — Other Ambulatory Visit (HOSPITAL_BASED_OUTPATIENT_CLINIC_OR_DEPARTMENT_OTHER): Payer: Self-pay

## 2023-01-23 DIAGNOSIS — N3020 Other chronic cystitis without hematuria: Secondary | ICD-10-CM | POA: Diagnosis not present

## 2023-01-23 DIAGNOSIS — R109 Unspecified abdominal pain: Secondary | ICD-10-CM | POA: Diagnosis not present

## 2023-01-23 DIAGNOSIS — R351 Nocturia: Secondary | ICD-10-CM | POA: Diagnosis not present

## 2023-01-23 DIAGNOSIS — R350 Frequency of micturition: Secondary | ICD-10-CM | POA: Diagnosis not present

## 2023-01-23 MED ORDER — PHENAZOPYRIDINE HCL 100 MG PO TABS
100.0000 mg | ORAL_TABLET | Freq: Two times a day (BID) | ORAL | 3 refills | Status: DC | PRN
  Filled 2023-01-23: qty 20, 10d supply, fill #0

## 2023-01-23 MED ORDER — ESTRADIOL 0.1 MG/GM VA CREA
TOPICAL_CREAM | VAGINAL | 10 refills | Status: AC
  Filled 2023-01-23: qty 42.5, 90d supply, fill #0
  Filled 2023-05-27: qty 42.5, 90d supply, fill #1

## 2023-02-03 DIAGNOSIS — N3020 Other chronic cystitis without hematuria: Secondary | ICD-10-CM | POA: Diagnosis not present

## 2023-02-03 DIAGNOSIS — R109 Unspecified abdominal pain: Secondary | ICD-10-CM | POA: Diagnosis not present

## 2023-02-10 DIAGNOSIS — K080 Exfoliation of teeth due to systemic causes: Secondary | ICD-10-CM | POA: Diagnosis not present

## 2023-03-02 ENCOUNTER — Inpatient Hospital Stay: Payer: Medicare Other | Attending: Hematology & Oncology

## 2023-03-02 ENCOUNTER — Inpatient Hospital Stay: Payer: Medicare Other | Admitting: Hematology & Oncology

## 2023-03-02 ENCOUNTER — Encounter: Payer: Self-pay | Admitting: Hematology & Oncology

## 2023-03-02 VITALS — BP 145/108 | HR 98 | Temp 98.3°F | Resp 20 | Ht 64.0 in | Wt 125.1 lb

## 2023-03-02 DIAGNOSIS — D708 Other neutropenia: Secondary | ICD-10-CM

## 2023-03-02 DIAGNOSIS — C91Z0 Other lymphoid leukemia not having achieved remission: Secondary | ICD-10-CM | POA: Diagnosis not present

## 2023-03-02 DIAGNOSIS — Z79899 Other long term (current) drug therapy: Secondary | ICD-10-CM | POA: Insufficient documentation

## 2023-03-02 DIAGNOSIS — D759 Disease of blood and blood-forming organs, unspecified: Secondary | ICD-10-CM | POA: Diagnosis not present

## 2023-03-02 DIAGNOSIS — R5383 Other fatigue: Secondary | ICD-10-CM | POA: Insufficient documentation

## 2023-03-02 LAB — CMP (CANCER CENTER ONLY)
ALT: 16 U/L (ref 0–44)
AST: 19 U/L (ref 15–41)
Albumin: 4.8 g/dL (ref 3.5–5.0)
Alkaline Phosphatase: 46 U/L (ref 38–126)
Anion gap: 7 (ref 5–15)
BUN: 11 mg/dL (ref 8–23)
CO2: 29 mmol/L (ref 22–32)
Calcium: 10 mg/dL (ref 8.9–10.3)
Chloride: 104 mmol/L (ref 98–111)
Creatinine: 0.59 mg/dL (ref 0.44–1.00)
GFR, Estimated: >60 mL/min (ref >60)
Glucose, Bld: 98 mg/dL (ref 70–99)
Potassium: 4.8 mmol/L (ref 3.5–5.1)
Sodium: 140 mmol/L (ref 135–145)
Total Bilirubin: 0.5 mg/dL (ref 0.3–1.2)
Total Protein: 7.5 g/dL (ref 6.5–8.1)

## 2023-03-02 LAB — CBC WITH DIFFERENTIAL (CANCER CENTER ONLY)
Abs Immature Granulocytes: 0 10*3/uL (ref 0.00–0.07)
Basophils Absolute: 0 10*3/uL (ref 0.0–0.1)
Basophils Relative: 0 %
Eosinophils Absolute: 0.1 10*3/uL (ref 0.0–0.5)
Eosinophils Relative: 1 %
HCT: 44.7 % (ref 36.0–46.0)
Hemoglobin: 14.9 g/dL (ref 12.0–15.0)
Immature Granulocytes: 0 %
Lymphocytes Relative: 47 %
Lymphs Abs: 1.8 10*3/uL (ref 0.7–4.0)
MCH: 30 pg (ref 26.0–34.0)
MCHC: 33.3 g/dL (ref 30.0–36.0)
MCV: 90.1 fL (ref 80.0–100.0)
Monocytes Absolute: 0.3 10*3/uL (ref 0.1–1.0)
Monocytes Relative: 8 %
Neutro Abs: 1.7 10*3/uL (ref 1.7–7.7)
Neutrophils Relative %: 44 %
Platelet Count: 199 10*3/uL (ref 150–400)
RBC: 4.96 MIL/uL (ref 3.87–5.11)
RDW: 12.4 % (ref 11.5–15.5)
WBC Count: 3.8 10*3/uL — ABNORMAL LOW (ref 4.0–10.5)
nRBC: 0 % (ref 0.0–0.2)

## 2023-03-02 LAB — LACTATE DEHYDROGENASE: LDH: 206 U/L — ABNORMAL HIGH (ref 98–192)

## 2023-03-02 LAB — SAVE SMEAR(SSMR), FOR PROVIDER SLIDE REVIEW

## 2023-03-02 NOTE — Progress Notes (Signed)
Hematology and Oncology Follow Up Visit  Mariah Everett [id number] October 15, 1956 67 y.o. 03/02/2023   Principle Diagnosis:  Ethnic associated leukopenia /LGL Leukemia (?)  Current Therapy:   Observation   Interim History:  Mariah Everett is here today for follow-up.  She is doing pretty well.  We last saw her back in January.  Since then, she really has had no complaints.  Unfortunately, she slipped and fell at home today.  She probably bruised her coccyx.  It is quite tender back there.  She has had no issues with fever.  There is been no problems with nausea or vomiting.  She has had no cough or shortness of breath.  She has had no rashes.  There is been no leg swelling.  There is been no headache.  Currently, I would say that her performance status is probably ECOG 1.     Wt Readings from Last 3 Encounters:  03/02/23 125 lb 1.9 oz (56.8 kg)  12/18/22 127 lb (57.6 kg)  12/15/22 127 lb (57.6 kg)    Medications:  Allergies as of 03/02/2023   No Known Allergies      Medication List        Accurate as of March 02, 2023  3:31 PM. If you have any questions, ask your nurse or doctor.          STOP taking these medications    methocarbamol 500 MG tablet Commonly known as: ROBAXIN Stopped by: Josph Macho, MD   metoprolol tartrate 50 MG tablet Commonly known as: LOPRESSOR Stopped by: Josph Macho, MD       TAKE these medications    aspirin EC 81 MG tablet Take 81 mg by mouth daily. Swallow whole.   B COMPLEX-VITAMIN B12 PO Take 1 tablet by mouth daily.   cetirizine 10 MG tablet Commonly known as: ZYRTEC Take 10 mg by mouth daily.   CULTURELLE PROBIOTICS PO Take 1 capsule by mouth daily.   estradiol 0.1 MG/GM vaginal cream Commonly known as: ESTRACE VAGINAL Insert 1 gram every night for 3 weeks then insert 1 gram twice a week by vaginal route as directed for 30 days.   estradiol 0.1 MG/GM vaginal cream Commonly known as: ESTRACE VAGINAL apply 1 gram  3 nights a week for a month, then once weekly thereafter.   omeprazole 20 MG capsule Commonly known as: PRILOSEC Take 1 capsule by mouth daily   phenazopyridine 100 MG tablet Commonly known as: Pyridium Take 1 tablet (100 mg total) by mouth 2 (two) times daily as needed.   TURMERIC PO Take 1,000 mg by mouth daily.   VITAMIN D PO Take 2,000 Int'l Units/day by mouth daily.   Zinc 50 MG Tabs Take 1 tablet by mouth daily.        Allergies: No Known Allergies  Past Medical History, Surgical history, Social history, and Family History were reviewed and updated.  Review of Systems: Review of Systems  Constitutional:  Positive for malaise/fatigue.  HENT: Negative.    Eyes: Negative.   Respiratory: Negative.    Cardiovascular: Negative.   Gastrointestinal: Negative.   Genitourinary: Negative.   Musculoskeletal: Negative.   Skin: Negative.   Neurological: Negative.   Endo/Heme/Allergies: Negative.   Psychiatric/Behavioral: Negative.        Physical Exam: Vital signs show a temperature of 98.6.  Pulse 111.  Blood pressure 135/84.  Weight is 127 pounds.  Wt Readings from Last 3 Encounters:  03/02/23 125 lb 1.9 oz (56.8 kg)  12/18/22 127 lb (57.6 kg)  12/15/22 127 lb (57.6 kg)    Physical Exam Vitals reviewed.  HENT:     Head: Normocephalic and atraumatic.  Eyes:     Pupils: Pupils are equal, round, and reactive to light.  Cardiovascular:     Rate and Rhythm: Normal rate and regular rhythm.     Heart sounds: Normal heart sounds.  Pulmonary:     Effort: Pulmonary effort is normal.     Breath sounds: Normal breath sounds.  Abdominal:     General: Bowel sounds are normal.     Palpations: Abdomen is soft.  Musculoskeletal:        General: No tenderness or deformity. Normal range of motion.     Cervical back: Normal range of motion.  Lymphadenopathy:     Cervical: No cervical adenopathy.  Skin:    General: Skin is warm and dry.     Findings: No erythema or  rash.  Neurological:     Mental Status: She is alert and oriented to person, place, and time.  Psychiatric:        Behavior: Behavior normal.        Thought Content: Thought content normal.        Judgment: Judgment normal.     Lab Results  Component Value Date   WBC 3.8 (L) 03/02/2023   HGB 14.9 03/02/2023   HCT 44.7 03/02/2023   MCV 90.1 03/02/2023   PLT 199 03/02/2023   No results found for: "FERRITIN", "IRON", "TIBC", "UIBC", "IRONPCTSAT" Lab Results  Component Value Date   RBC 4.96 03/02/2023   No results found for: "KPAFRELGTCHN", "LAMBDASER", "KAPLAMBRATIO" No results found for: "IGGSERUM", "IGA", "IGMSERUM" No results found for: "TOTALPROTELP", "ALBUMINELP", "A1GS", "A2GS", "BETS", "BETA2SER", "GAMS", "MSPIKE", "SPEI"   Chemistry      Component Value Date/Time   NA 140 03/02/2023 1446   K 4.8 03/02/2023 1446   CL 104 03/02/2023 1446   CO2 29 03/02/2023 1446   BUN 11 03/02/2023 1446   CREATININE 0.59 03/02/2023 1446   CREATININE 0.64 10/04/2021 0000      Component Value Date/Time   CALCIUM 10.0 03/02/2023 1446   ALKPHOS 46 03/02/2023 1446   AST 19 03/02/2023 1446   ALT 16 03/02/2023 1446   BILITOT 0.5 03/02/2023 1446       Impression and Plan: Mariah Everett is a 67 yo African American female with long history of chronic leukopenia felt to be EAL.  However, there is a minor monoclonal population of T lymphocytes.  This will have to be followed.  At less than 5% on the bone marrow biopsy, there is no indication that we have to do anything right now.  I forgot to mention that we did run the cytogenetics on her bone marrow.  The cytogenetics were normal.  The FISH studies were also normal.  Her white cell count is trending upward.  I am happy about this part.  She is no longer neutropenic.  We will still follow her along in 3 months.  I am sure that her white cell count will trend up and down.  I am just happy that she is doing well.  I feel bad that she had  this fall in her coccyx.  This may take a couple weeks before she starts feeling better. Marland Kitchen   Josph Macho, MD 4/8/20243:31 PM

## 2023-03-11 DIAGNOSIS — R350 Frequency of micturition: Secondary | ICD-10-CM | POA: Diagnosis not present

## 2023-03-11 DIAGNOSIS — N3020 Other chronic cystitis without hematuria: Secondary | ICD-10-CM | POA: Diagnosis not present

## 2023-03-12 ENCOUNTER — Other Ambulatory Visit: Payer: Self-pay | Admitting: Family Medicine

## 2023-03-12 ENCOUNTER — Ambulatory Visit
Admission: RE | Admit: 2023-03-12 | Discharge: 2023-03-12 | Disposition: A | Discharge status: Home / Self Care | Payer: Medicare Other | Source: Ambulatory Visit | Attending: Family Medicine | Admitting: Family Medicine

## 2023-03-12 DIAGNOSIS — W19XXXA Unspecified fall, initial encounter: Secondary | ICD-10-CM

## 2023-03-12 DIAGNOSIS — M533 Sacrococcygeal disorders, not elsewhere classified: Secondary | ICD-10-CM | POA: Diagnosis not present

## 2023-04-07 DIAGNOSIS — D72819 Decreased white blood cell count, unspecified: Secondary | ICD-10-CM | POA: Diagnosis not present

## 2023-04-15 DIAGNOSIS — I700 Atherosclerosis of aorta: Secondary | ICD-10-CM | POA: Diagnosis not present

## 2023-04-15 DIAGNOSIS — R5383 Other fatigue: Secondary | ICD-10-CM | POA: Diagnosis not present

## 2023-04-15 DIAGNOSIS — D709 Neutropenia, unspecified: Secondary | ICD-10-CM | POA: Diagnosis not present

## 2023-04-15 DIAGNOSIS — M533 Sacrococcygeal disorders, not elsewhere classified: Secondary | ICD-10-CM | POA: Diagnosis not present

## 2023-04-27 ENCOUNTER — Other Ambulatory Visit (HOSPITAL_BASED_OUTPATIENT_CLINIC_OR_DEPARTMENT_OTHER): Payer: Self-pay

## 2023-04-27 DIAGNOSIS — N3020 Other chronic cystitis without hematuria: Secondary | ICD-10-CM | POA: Diagnosis not present

## 2023-04-27 DIAGNOSIS — N3000 Acute cystitis without hematuria: Secondary | ICD-10-CM | POA: Diagnosis not present

## 2023-04-27 MED ORDER — SULFAMETHOXAZOLE-TRIMETHOPRIM 800-160 MG PO TABS
1.0000 | ORAL_TABLET | Freq: Two times a day (BID) | ORAL | 0 refills | Status: DC
  Filled 2023-04-27: qty 14, 7d supply, fill #0

## 2023-04-27 MED ORDER — CELECOXIB 100 MG PO CAPS
100.0000 mg | ORAL_CAPSULE | Freq: Two times a day (BID) | ORAL | 1 refills | Status: DC
  Filled 2023-04-27: qty 28, 14d supply, fill #0
  Filled 2023-05-27: qty 28, 14d supply, fill #1

## 2023-05-05 DIAGNOSIS — D72819 Decreased white blood cell count, unspecified: Secondary | ICD-10-CM | POA: Diagnosis not present

## 2023-05-11 DIAGNOSIS — D72819 Decreased white blood cell count, unspecified: Secondary | ICD-10-CM | POA: Diagnosis not present

## 2023-05-11 DIAGNOSIS — D709 Neutropenia, unspecified: Secondary | ICD-10-CM | POA: Diagnosis not present

## 2023-05-27 ENCOUNTER — Other Ambulatory Visit (HOSPITAL_BASED_OUTPATIENT_CLINIC_OR_DEPARTMENT_OTHER): Payer: Self-pay

## 2023-06-01 ENCOUNTER — Other Ambulatory Visit (HOSPITAL_BASED_OUTPATIENT_CLINIC_OR_DEPARTMENT_OTHER): Payer: Self-pay

## 2023-06-01 ENCOUNTER — Inpatient Hospital Stay: Payer: Medicare Other

## 2023-06-01 ENCOUNTER — Ambulatory Visit: Payer: Medicare Other | Admitting: Hematology & Oncology

## 2023-06-10 ENCOUNTER — Other Ambulatory Visit: Payer: Self-pay

## 2023-06-10 ENCOUNTER — Inpatient Hospital Stay: Payer: Medicare Other | Admitting: Hematology & Oncology

## 2023-06-10 ENCOUNTER — Encounter: Payer: Self-pay | Admitting: Hematology & Oncology

## 2023-06-10 ENCOUNTER — Inpatient Hospital Stay: Payer: Medicare Other | Attending: Hematology & Oncology

## 2023-06-10 VITALS — BP 134/89 | HR 90 | Temp 99.4°F | Resp 17 | Ht 64.0 in | Wt 125.0 lb

## 2023-06-10 DIAGNOSIS — R509 Fever, unspecified: Secondary | ICD-10-CM | POA: Diagnosis not present

## 2023-06-10 DIAGNOSIS — C91Z0 Other lymphoid leukemia not having achieved remission: Secondary | ICD-10-CM | POA: Insufficient documentation

## 2023-06-10 DIAGNOSIS — Z79899 Other long term (current) drug therapy: Secondary | ICD-10-CM | POA: Diagnosis not present

## 2023-06-10 DIAGNOSIS — R5383 Other fatigue: Secondary | ICD-10-CM | POA: Diagnosis not present

## 2023-06-10 DIAGNOSIS — D708 Other neutropenia: Secondary | ICD-10-CM

## 2023-06-10 DIAGNOSIS — D759 Disease of blood and blood-forming organs, unspecified: Secondary | ICD-10-CM

## 2023-06-10 LAB — CBC WITH DIFFERENTIAL (CANCER CENTER ONLY)
Abs Immature Granulocytes: 0.01 10*3/uL (ref 0.00–0.07)
Basophils Absolute: 0 10*3/uL (ref 0.0–0.1)
Basophils Relative: 0 %
Eosinophils Absolute: 0 10*3/uL (ref 0.0–0.5)
Eosinophils Relative: 1 %
HCT: 45.8 % (ref 36.0–46.0)
Hemoglobin: 14.8 g/dL (ref 12.0–15.0)
Immature Granulocytes: 0 %
Lymphocytes Relative: 62 %
Lymphs Abs: 1.7 10*3/uL (ref 0.7–4.0)
MCH: 29.5 pg (ref 26.0–34.0)
MCHC: 32.3 g/dL (ref 30.0–36.0)
MCV: 91.2 fL (ref 80.0–100.0)
Monocytes Absolute: 0.2 10*3/uL (ref 0.1–1.0)
Monocytes Relative: 8 %
Neutro Abs: 0.8 10*3/uL — ABNORMAL LOW (ref 1.7–7.7)
Neutrophils Relative %: 29 %
Platelet Count: 194 10*3/uL (ref 150–400)
RBC: 5.02 MIL/uL (ref 3.87–5.11)
RDW: 12.4 % (ref 11.5–15.5)
WBC Count: 2.7 10*3/uL — ABNORMAL LOW (ref 4.0–10.5)
nRBC: 0 % (ref 0.0–0.2)

## 2023-06-10 LAB — CMP (CANCER CENTER ONLY)
ALT: 11 U/L (ref 0–44)
AST: 15 U/L (ref 15–41)
Albumin: 4.9 g/dL (ref 3.5–5.0)
Alkaline Phosphatase: 39 U/L (ref 38–126)
Anion gap: 7 (ref 5–15)
BUN: 7 mg/dL — ABNORMAL LOW (ref 8–23)
CO2: 31 mmol/L (ref 22–32)
Calcium: 10.3 mg/dL (ref 8.9–10.3)
Chloride: 106 mmol/L (ref 98–111)
Creatinine: 0.58 mg/dL (ref 0.44–1.00)
GFR, Estimated: >60 mL/min (ref >60)
Glucose, Bld: 121 mg/dL — ABNORMAL HIGH (ref 70–99)
Potassium: 4.6 mmol/L (ref 3.5–5.1)
Sodium: 144 mmol/L (ref 135–145)
Total Bilirubin: 0.8 mg/dL (ref 0.3–1.2)
Total Protein: 7.4 g/dL (ref 6.5–8.1)

## 2023-06-10 LAB — IRON AND IRON BINDING CAPACITY (CC-WL,HP ONLY)
Iron: 95 ug/dL (ref 28–170)
Saturation Ratios: 28 % (ref 10.4–31.8)
TIBC: 337 ug/dL (ref 250–450)
UIBC: 242 ug/dL (ref 148–442)

## 2023-06-10 LAB — FERRITIN: Ferritin: 203 ng/mL (ref 11–307)

## 2023-06-10 LAB — RETICULOCYTES
Immature Retic Fract: 4 % (ref 2.3–15.9)
RBC.: 4.98 MIL/uL (ref 3.87–5.11)
Retic Count, Absolute: 65.7 10*3/uL (ref 19.0–186.0)
Retic Ct Pct: 1.3 % (ref 0.4–3.1)

## 2023-06-10 LAB — SAVE SMEAR(SSMR), FOR PROVIDER SLIDE REVIEW

## 2023-06-10 NOTE — Progress Notes (Signed)
Hematology and Oncology Follow Up Visit  Mariah Everett [id number] [birth year] 67 y.o. 06/10/2023   Principle Diagnosis:  Ethnic associated leukopenia /LGL Leukemia (?)  Current Therapy:   Observation   Interim History:  Mariah Everett is here today for follow-up.  We last saw her back in January.  Since then, she been doing pretty well.  When we initially saw her, she had a bone marrow biopsy done.  The bone marrow was relatively unremarkable.  There were about 5% abnormal T lymphocytes.  She has had no problems with infections.  She does not feel all that well today.  She had a low-grade temperature.  She has had no change in bowel or bladder habits.  She has had no nausea or vomiting.  She has had no cough or shortness of breath.  There is been no issues with COVID.  She has had no rashes.  She has had no leg swelling.  Overall, I would say that her performance status is probably ECOG 1.      Wt Readings from Last 3 Encounters:  06/10/23 125 lb (56.7 kg)  03/02/23 125 lb 1.9 oz (56.8 kg)  12/18/22 127 lb (57.6 kg)    Medications:  Allergies as of 06/10/2023   No Known Allergies      Medication List        Accurate as of June 10, 2023  1:29 PM. If you have any questions, ask your nurse or doctor.          STOP taking these medications    Zinc 50 MG Tabs Stopped by: Josph Macho       TAKE these medications    aspirin EC 81 MG tablet Take 81 mg by mouth daily. Swallow whole.   B COMPLEX-VITAMIN B12 PO Take 1 tablet by mouth daily.   celecoxib 100 MG capsule Commonly known as: CeleBREX Take 1 capsule (100 mg total) by mouth 2 (two) times daily.   cetirizine 10 MG tablet Commonly known as: ZYRTEC Take 10 mg by mouth daily.   CULTURELLE PROBIOTICS PO Take 1 capsule by mouth daily.   estradiol 0.1 MG/GM vaginal cream Commonly known as: ESTRACE VAGINAL apply 1 gram 3 nights a week for a month, then once weekly thereafter. What changed: Another  medication with the same name was removed. Continue taking this medication, and follow the directions you see here. Changed by: Josph Macho   omeprazole 20 MG capsule Commonly known as: PRILOSEC Take 1 capsule by mouth daily   phenazopyridine 100 MG tablet Commonly known as: Pyridium Take 1 tablet (100 mg total) by mouth 2 (two) times daily as needed.   sulfamethoxazole-trimethoprim 800-160 MG tablet Commonly known as: Bactrim DS Take 1 tablet by mouth 2 (two) times daily.   TURMERIC PO Take 1,000 mg by mouth daily.   VITAMIN D PO Take 2,000 Int'l Units/day by mouth daily.        Allergies: No Known Allergies  Past Medical History, Surgical history, Social history, and Family History were reviewed and updated.  Review of Systems: Review of Systems  Constitutional:  Positive for malaise/fatigue.  HENT: Negative.    Eyes: Negative.   Respiratory: Negative.    Cardiovascular: Negative.   Gastrointestinal: Negative.   Genitourinary: Negative.   Musculoskeletal: Negative.   Skin: Negative.   Neurological: Negative.   Endo/Heme/Allergies: Negative.   Psychiatric/Behavioral: Negative.        Physical Exam: Vital signs show a temperature of 98.6.  Pulse  111.  Blood pressure 135/84.  Weight is 127 pounds.  Wt Readings from Last 3 Encounters:  06/10/23 125 lb (56.7 kg)  03/02/23 125 lb 1.9 oz (56.8 kg)  12/18/22 127 lb (57.6 kg)    Physical Exam Vitals reviewed.  HENT:     Head: Normocephalic and atraumatic.  Eyes:     Pupils: Pupils are equal, round, and reactive to light.  Cardiovascular:     Rate and Rhythm: Normal rate and regular rhythm.     Heart sounds: Normal heart sounds.  Pulmonary:     Effort: Pulmonary effort is normal.     Breath sounds: Normal breath sounds.  Abdominal:     General: Bowel sounds are normal.     Palpations: Abdomen is soft.  Musculoskeletal:        General: No tenderness or deformity. Normal range of motion.     Cervical  back: Normal range of motion.  Lymphadenopathy:     Cervical: No cervical adenopathy.  Skin:    General: Skin is warm and dry.     Findings: No erythema or rash.  Neurological:     Mental Status: She is alert and oriented to person, place, and time.  Psychiatric:        Behavior: Behavior normal.        Thought Content: Thought content normal.        Judgment: Judgment normal.      Lab Results  Component Value Date   WBC 2.7 (L) 06/10/2023   HGB 14.8 06/10/2023   HCT 45.8 06/10/2023   MCV 91.2 06/10/2023   PLT 194 06/10/2023   No results found for: "FERRITIN", "IRON", "TIBC", "UIBC", "IRONPCTSAT" Lab Results  Component Value Date   RETICCTPCT 1.3 06/10/2023   RBC 4.98 06/10/2023   No results found for: "KPAFRELGTCHN", "LAMBDASER", "KAPLAMBRATIO" No results found for: "IGGSERUM", "IGA", "IGMSERUM" No results found for: "TOTALPROTELP", "ALBUMINELP", "A1GS", "A2GS", "BETS", "BETA2SER", "GAMS", "MSPIKE", "SPEI"   Chemistry      Component Value Date/Time   NA 144 06/10/2023 1159   K 4.6 06/10/2023 1159   CL 106 06/10/2023 1159   CO2 31 06/10/2023 1159   BUN 7 (L) 06/10/2023 1159   CREATININE 0.58 06/10/2023 1159   CREATININE 0.64 10/04/2021 0000      Component Value Date/Time   CALCIUM 10.3 06/10/2023 1159   ALKPHOS 39 06/10/2023 1159   AST 15 06/10/2023 1159   ALT 11 06/10/2023 1159   BILITOT 0.8 06/10/2023 1159       Impression and Plan: Mariah Everett is a 67 yo African American female with long history of chronic leukopenia felt to be EAL.  However, there is a minor monoclonal population of T lymphocytes.  This will have to be followed.  At less than 5% on the bone marrow biopsy, there is no indication that we have to do anything right now.  I forgot to mention that we did run the cytogenetics on her bone marrow.  The cytogenetics were normal.  The FISH studies were also normal.  Her white cell count is on the lower side.  I did look at her blood smear.  I  really did not see anything that looked suspicious.  I suspect that her white cells will tend to fluctuate.  At this point, I still think we can just follow her along.  I know that she is quite worried.  For right now, we will plan to get her back in 3 months.  We may  have to follow her a little bit more closely.    Josph Macho, MD 7/17/20241:29 PM

## 2023-06-11 ENCOUNTER — Other Ambulatory Visit (HOSPITAL_BASED_OUTPATIENT_CLINIC_OR_DEPARTMENT_OTHER): Payer: Self-pay

## 2023-06-11 DIAGNOSIS — N3020 Other chronic cystitis without hematuria: Secondary | ICD-10-CM | POA: Diagnosis not present

## 2023-06-11 DIAGNOSIS — R350 Frequency of micturition: Secondary | ICD-10-CM | POA: Diagnosis not present

## 2023-06-11 DIAGNOSIS — R109 Unspecified abdominal pain: Secondary | ICD-10-CM | POA: Diagnosis not present

## 2023-06-11 MED ORDER — NITROFURANTOIN MONOHYD MACRO 100 MG PO CAPS
100.0000 mg | ORAL_CAPSULE | Freq: Every day | ORAL | 11 refills | Status: DC
  Filled 2023-06-11: qty 30, 30d supply, fill #0

## 2023-06-19 DIAGNOSIS — H524 Presbyopia: Secondary | ICD-10-CM | POA: Diagnosis not present

## 2023-06-24 ENCOUNTER — Other Ambulatory Visit (HOSPITAL_BASED_OUTPATIENT_CLINIC_OR_DEPARTMENT_OTHER): Payer: Self-pay

## 2023-06-24 DIAGNOSIS — R051 Acute cough: Secondary | ICD-10-CM | POA: Diagnosis not present

## 2023-06-24 DIAGNOSIS — U071 COVID-19: Secondary | ICD-10-CM | POA: Diagnosis not present

## 2023-06-24 MED ORDER — PAXLOVID (300/100) 20 X 150 MG & 10 X 100MG PO TBPK
3.0000 | ORAL_TABLET | Freq: Two times a day (BID) | ORAL | 0 refills | Status: DC
  Filled 2023-06-24: qty 30, 5d supply, fill #0

## 2023-07-20 ENCOUNTER — Other Ambulatory Visit (HOSPITAL_BASED_OUTPATIENT_CLINIC_OR_DEPARTMENT_OTHER): Payer: Self-pay

## 2023-07-20 DIAGNOSIS — R058 Other specified cough: Secondary | ICD-10-CM | POA: Diagnosis not present

## 2023-07-20 DIAGNOSIS — D72819 Decreased white blood cell count, unspecified: Secondary | ICD-10-CM | POA: Diagnosis not present

## 2023-07-20 DIAGNOSIS — N3020 Other chronic cystitis without hematuria: Secondary | ICD-10-CM | POA: Diagnosis not present

## 2023-07-20 DIAGNOSIS — R0982 Postnasal drip: Secondary | ICD-10-CM | POA: Diagnosis not present

## 2023-07-20 MED ORDER — FLUTICASONE PROPIONATE 50 MCG/ACT NA SUSP
1.0000 | Freq: Every day | NASAL | 3 refills | Status: AC
  Filled 2023-07-20: qty 16, 30d supply, fill #0
  Filled 2024-01-27: qty 16, 30d supply, fill #1

## 2023-07-20 MED ORDER — AZITHROMYCIN 250 MG PO TABS
ORAL_TABLET | ORAL | 0 refills | Status: DC
  Filled 2023-07-20: qty 6, 5d supply, fill #0

## 2023-07-20 MED ORDER — CETIRIZINE HCL 10 MG PO TABS
10.0000 mg | ORAL_TABLET | Freq: Every evening | ORAL | 1 refills | Status: AC
  Filled 2023-07-20: qty 100, 90d supply, fill #0
  Filled 2023-10-26: qty 90, 90d supply, fill #1
  Filled 2024-01-27: qty 10, 10d supply, fill #2

## 2023-07-20 MED ORDER — BENZONATATE 200 MG PO CAPS
200.0000 mg | ORAL_CAPSULE | Freq: Three times a day (TID) | ORAL | 0 refills | Status: DC | PRN
  Filled 2023-07-20: qty 30, 10d supply, fill #0

## 2023-08-03 DIAGNOSIS — H524 Presbyopia: Secondary | ICD-10-CM | POA: Diagnosis not present

## 2023-08-12 ENCOUNTER — Institutional Professional Consult (permissible substitution): Payer: Medicare Other | Admitting: Internal Medicine

## 2023-08-18 DIAGNOSIS — K080 Exfoliation of teeth due to systemic causes: Secondary | ICD-10-CM | POA: Diagnosis not present

## 2023-09-10 ENCOUNTER — Other Ambulatory Visit: Payer: Self-pay

## 2023-09-10 ENCOUNTER — Inpatient Hospital Stay: Payer: Medicare Other | Attending: Hematology & Oncology

## 2023-09-10 ENCOUNTER — Inpatient Hospital Stay: Payer: Medicare Other | Admitting: Hematology & Oncology

## 2023-09-10 ENCOUNTER — Encounter: Payer: Self-pay | Admitting: Hematology & Oncology

## 2023-09-10 VITALS — BP 129/83 | HR 85 | Temp 98.0°F | Resp 18 | Ht 64.0 in | Wt 125.1 lb

## 2023-09-10 DIAGNOSIS — D72819 Decreased white blood cell count, unspecified: Secondary | ICD-10-CM | POA: Insufficient documentation

## 2023-09-10 DIAGNOSIS — D708 Other neutropenia: Secondary | ICD-10-CM

## 2023-09-10 DIAGNOSIS — R5383 Other fatigue: Secondary | ICD-10-CM | POA: Insufficient documentation

## 2023-09-10 DIAGNOSIS — C91Z0 Other lymphoid leukemia not having achieved remission: Secondary | ICD-10-CM | POA: Diagnosis not present

## 2023-09-10 DIAGNOSIS — Z79899 Other long term (current) drug therapy: Secondary | ICD-10-CM | POA: Diagnosis not present

## 2023-09-10 LAB — CMP (CANCER CENTER ONLY)
ALT: 11 U/L (ref 0–44)
AST: 17 U/L (ref 15–41)
Albumin: 4.6 g/dL (ref 3.5–5.0)
Alkaline Phosphatase: 37 U/L — ABNORMAL LOW (ref 38–126)
Anion gap: 7 (ref 5–15)
BUN: 10 mg/dL (ref 8–23)
CO2: 29 mmol/L (ref 22–32)
Calcium: 9.5 mg/dL (ref 8.9–10.3)
Chloride: 106 mmol/L (ref 98–111)
Creatinine: 0.64 mg/dL (ref 0.44–1.00)
GFR, Estimated: >60 mL/min (ref >60)
Glucose, Bld: 93 mg/dL (ref 70–99)
Potassium: 4 mmol/L (ref 3.5–5.1)
Sodium: 142 mmol/L (ref 135–145)
Total Bilirubin: 0.7 mg/dL (ref 0.3–1.2)
Total Protein: 7 g/dL (ref 6.5–8.1)

## 2023-09-10 LAB — CBC WITH DIFFERENTIAL (CANCER CENTER ONLY)
Abs Immature Granulocytes: 0 10*3/uL (ref 0.00–0.07)
Basophils Absolute: 0 10*3/uL (ref 0.0–0.1)
Basophils Relative: 0 %
Eosinophils Absolute: 0 10*3/uL (ref 0.0–0.5)
Eosinophils Relative: 1 %
HCT: 44.3 % (ref 36.0–46.0)
Hemoglobin: 14.5 g/dL (ref 12.0–15.0)
Immature Granulocytes: 0 %
Lymphocytes Relative: 69 %
Lymphs Abs: 1.6 10*3/uL (ref 0.7–4.0)
MCH: 29.1 pg (ref 26.0–34.0)
MCHC: 32.7 g/dL (ref 30.0–36.0)
MCV: 88.8 fL (ref 80.0–100.0)
Monocytes Absolute: 0.2 10*3/uL (ref 0.1–1.0)
Monocytes Relative: 6 %
Neutro Abs: 0.6 10*3/uL — ABNORMAL LOW (ref 1.7–7.7)
Neutrophils Relative %: 24 %
Platelet Count: 180 10*3/uL (ref 150–400)
RBC: 4.99 MIL/uL (ref 3.87–5.11)
RDW: 12.6 % (ref 11.5–15.5)
WBC Count: 2.4 10*3/uL — ABNORMAL LOW (ref 4.0–10.5)
nRBC: 0 % (ref 0.0–0.2)

## 2023-09-10 LAB — SAVE SMEAR(SSMR), FOR PROVIDER SLIDE REVIEW

## 2023-09-10 LAB — LACTATE DEHYDROGENASE: LDH: 174 U/L (ref 98–192)

## 2023-09-10 NOTE — Progress Notes (Signed)
Hematology and Oncology Follow Up Visit  Mariah Everett [id number] 06/17/1956 67 y.o. 09/10/2023   Principle Diagnosis:  Ethnic associated leukopenia /LGL Leukemia (?)  Current Therapy:   Observation   Interim History:  Mariah Everett is here today for follow-up.  She is doing pretty well.  We last saw her back in July.  Since then, she has really had no complaints.  There is been no problem with infections.  She has had no rashes.  There is been no fever.  She has had no cough or shortness of breath.  There has been no nausea or vomiting.  She is eating well.  There is no change in bowel or bladder habits.  She has had no bleeding.  Overall, I would say that her performance status is probably ECOG 1.    Wt Readings from Last 3 Encounters:  09/10/23 125 lb 1.3 oz (56.7 kg)  06/10/23 125 lb (56.7 kg)  03/02/23 125 lb 1.9 oz (56.8 kg)    Medications:  Allergies as of 09/10/2023   No Known Allergies      Medication List        Accurate as of September 10, 2023  9:55 AM. If you have any questions, ask your nurse or doctor.          STOP taking these medications    aspirin EC 81 MG tablet Stopped by: Mariah Everett   azithromycin 250 MG tablet Commonly known as: Zithromax Z-Pak Stopped by: Mariah Everett   benzonatate 200 MG capsule Commonly known as: TESSALON Stopped by: Mariah Everett   celecoxib 100 MG capsule Commonly known as: CeleBREX Stopped by: Mariah Everett   nitrofurantoin (macrocrystal-monohydrate) 100 MG capsule Commonly known as: Macrobid Stopped by: Mariah Everett   sulfamethoxazole-trimethoprim 800-160 MG tablet Commonly known as: Bactrim DS Stopped by: Mariah Everett       TAKE these medications    B COMPLEX-VITAMIN B12 PO Take 1 tablet by mouth daily.   cetirizine 10 MG tablet Commonly known as: ZYRTEC Take 1 tablet (10 mg total) by mouth every evening. What changed: Another medication with the same name was removed. Continue  taking this medication, and follow the directions you see here. Changed by: Mariah Everett   CULTURELLE PROBIOTICS PO Take 1 capsule by mouth daily.   estradiol 0.1 MG/GM vaginal cream Commonly known as: ESTRACE VAGINAL apply 1 gram 3 nights a week for a month, then once weekly thereafter.   fluticasone 50 MCG/ACT nasal spray Commonly known as: Flonase Allergy Relief Place 1 spray into both nostrils daily.   omeprazole 20 MG capsule Commonly known as: PRILOSEC Take 1 capsule by mouth daily   phenazopyridine 100 MG tablet Commonly known as: Pyridium Take 1 tablet (100 mg total) by mouth 2 (two) times daily as needed.   TURMERIC PO Take 1,000 mg by mouth daily.   VITAMIN D PO Take 2,000 Int'l Units/day by mouth daily.        Allergies: No Known Allergies  Past Medical History, Surgical history, Social history, and Family History were reviewed and updated.  Review of Systems: Review of Systems  Constitutional:  Positive for malaise/fatigue.  HENT: Negative.    Eyes: Negative.   Respiratory: Negative.    Cardiovascular: Negative.   Gastrointestinal: Negative.   Genitourinary: Negative.   Musculoskeletal: Negative.   Skin: Negative.   Neurological: Negative.   Endo/Heme/Allergies: Negative.   Psychiatric/Behavioral: Negative.  Physical Exam: Vital signs show a temperature of 98.6.  Pulse 111.  Blood pressure 135/84.  Weight is 125 pounds.  Wt Readings from Last 3 Encounters:  09/10/23 125 lb 1.3 oz (56.7 kg)  06/10/23 125 lb (56.7 kg)  03/02/23 125 lb 1.9 oz (56.8 kg)    Physical Exam Vitals reviewed.  HENT:     Head: Normocephalic and atraumatic.  Eyes:     Pupils: Pupils are equal, round, and reactive to light.  Cardiovascular:     Rate and Rhythm: Normal rate and regular rhythm.     Heart sounds: Normal heart sounds.  Pulmonary:     Effort: Pulmonary effort is normal.     Breath sounds: Normal breath sounds.  Abdominal:     General:  Bowel sounds are normal.     Palpations: Abdomen is soft.  Musculoskeletal:        General: No tenderness or deformity. Normal range of motion.     Cervical back: Normal range of motion.  Lymphadenopathy:     Cervical: No cervical adenopathy.  Skin:    General: Skin is warm and dry.     Findings: No erythema or rash.  Neurological:     Mental Status: She is alert and oriented to person, place, and time.  Psychiatric:        Behavior: Behavior normal.        Thought Content: Thought content normal.        Judgment: Judgment normal.     Lab Results  Component Value Date   WBC 2.4 (L) 09/10/2023   HGB 14.5 09/10/2023   HCT 44.3 09/10/2023   MCV 88.8 09/10/2023   PLT 180 09/10/2023   Lab Results  Component Value Date   FERRITIN 203 06/10/2023   IRON 95 06/10/2023   TIBC 337 06/10/2023   UIBC 242 06/10/2023   IRONPCTSAT 28 06/10/2023   Lab Results  Component Value Date   RETICCTPCT 1.3 06/10/2023   RBC 4.99 09/10/2023   No results found for: "KPAFRELGTCHN", "LAMBDASER", "KAPLAMBRATIO" No results found for: "IGGSERUM", "IGA", "IGMSERUM" No results found for: "TOTALPROTELP", "ALBUMINELP", "A1GS", "A2GS", "BETS", "BETA2SER", "GAMS", "MSPIKE", "SPEI"   Chemistry      Component Value Date/Time   NA 144 06/10/2023 1159   K 4.6 06/10/2023 1159   CL 106 06/10/2023 1159   CO2 31 06/10/2023 1159   BUN 7 (L) 06/10/2023 1159   CREATININE 0.58 06/10/2023 1159   CREATININE 0.64 10/04/2021 0000      Component Value Date/Time   CALCIUM 10.3 06/10/2023 1159   ALKPHOS 39 06/10/2023 1159   AST 15 06/10/2023 1159   ALT 11 06/10/2023 1159   BILITOT 0.8 06/10/2023 1159       Impression and Plan: Mariah Everett is a 67 yo African American female with long history of chronic leukopenia felt to be EAL.  However, there is a minor monoclonal population of T lymphocytes.  This will have to be followed.  At less than 5% on the bone marrow biopsy, there is no indication that we have to do  anything right now.  I forgot to mention that we did run the cytogenetics on her bone marrow.  The cytogenetics were normal.  The FISH studies were also normal.  For right now, we will just watch her.  I looked at her peripheral blood smear.  I do not see anything that looks suspicious.  Her white cells were on the low side.  Again, and she has had  no problem with infections.  We can just watch her for right now.  Additionally possibly she may have cyclic neutropenia.  I noted that her white cell counts have trended up and down.  We will plan to get her back after the Holidays.  Hopefully, her white cells will be up a little bit.   Mariah Macho, MD 10/17/20249:55 AM

## 2023-09-22 ENCOUNTER — Institutional Professional Consult (permissible substitution): Payer: Medicare Other | Admitting: Internal Medicine

## 2023-09-24 DIAGNOSIS — D72819 Decreased white blood cell count, unspecified: Secondary | ICD-10-CM | POA: Diagnosis not present

## 2023-09-24 DIAGNOSIS — Z0000 Encounter for general adult medical examination without abnormal findings: Secondary | ICD-10-CM | POA: Diagnosis not present

## 2023-09-24 DIAGNOSIS — E559 Vitamin D deficiency, unspecified: Secondary | ICD-10-CM | POA: Diagnosis not present

## 2023-09-28 DIAGNOSIS — D72819 Decreased white blood cell count, unspecified: Secondary | ICD-10-CM | POA: Diagnosis not present

## 2023-09-28 DIAGNOSIS — Z1211 Encounter for screening for malignant neoplasm of colon: Secondary | ICD-10-CM | POA: Diagnosis not present

## 2023-09-28 DIAGNOSIS — F419 Anxiety disorder, unspecified: Secondary | ICD-10-CM | POA: Diagnosis not present

## 2023-09-28 DIAGNOSIS — Z23 Encounter for immunization: Secondary | ICD-10-CM | POA: Diagnosis not present

## 2023-09-28 DIAGNOSIS — I700 Atherosclerosis of aorta: Secondary | ICD-10-CM | POA: Diagnosis not present

## 2023-09-28 DIAGNOSIS — Z0001 Encounter for general adult medical examination with abnormal findings: Secondary | ICD-10-CM | POA: Diagnosis not present

## 2023-09-28 DIAGNOSIS — Z136 Encounter for screening for cardiovascular disorders: Secondary | ICD-10-CM | POA: Diagnosis not present

## 2023-09-29 ENCOUNTER — Other Ambulatory Visit (HOSPITAL_BASED_OUTPATIENT_CLINIC_OR_DEPARTMENT_OTHER): Payer: Self-pay

## 2023-09-29 MED ORDER — ASPIRIN 81 MG PO TBEC
81.0000 mg | DELAYED_RELEASE_TABLET | Freq: Every day | ORAL | 1 refills | Status: DC
  Filled 2023-09-29: qty 120, 120d supply, fill #0

## 2023-09-29 MED ORDER — OMEPRAZOLE 20 MG PO TBDD: DELAYED_RELEASE_TABLET | ORAL | 2 refills | Status: DC

## 2023-09-30 ENCOUNTER — Other Ambulatory Visit (HOSPITAL_BASED_OUTPATIENT_CLINIC_OR_DEPARTMENT_OTHER): Payer: Self-pay

## 2023-10-20 DIAGNOSIS — D709 Neutropenia, unspecified: Secondary | ICD-10-CM | POA: Diagnosis not present

## 2023-10-20 DIAGNOSIS — I10 Essential (primary) hypertension: Secondary | ICD-10-CM | POA: Diagnosis not present

## 2023-10-20 DIAGNOSIS — K219 Gastro-esophageal reflux disease without esophagitis: Secondary | ICD-10-CM | POA: Diagnosis not present

## 2023-10-20 DIAGNOSIS — I700 Atherosclerosis of aorta: Secondary | ICD-10-CM | POA: Diagnosis not present

## 2023-10-20 DIAGNOSIS — M8580 Other specified disorders of bone density and structure, unspecified site: Secondary | ICD-10-CM | POA: Diagnosis not present

## 2023-10-26 ENCOUNTER — Other Ambulatory Visit (HOSPITAL_BASED_OUTPATIENT_CLINIC_OR_DEPARTMENT_OTHER): Payer: Self-pay

## 2023-11-03 DIAGNOSIS — Z124 Encounter for screening for malignant neoplasm of cervix: Secondary | ICD-10-CM | POA: Diagnosis not present

## 2023-11-03 DIAGNOSIS — Z1231 Encounter for screening mammogram for malignant neoplasm of breast: Secondary | ICD-10-CM | POA: Diagnosis not present

## 2023-11-04 ENCOUNTER — Other Ambulatory Visit (HOSPITAL_BASED_OUTPATIENT_CLINIC_OR_DEPARTMENT_OTHER): Payer: Self-pay

## 2023-11-10 DIAGNOSIS — D709 Neutropenia, unspecified: Secondary | ICD-10-CM | POA: Diagnosis not present

## 2023-12-17 ENCOUNTER — Other Ambulatory Visit (HOSPITAL_BASED_OUTPATIENT_CLINIC_OR_DEPARTMENT_OTHER): Payer: Self-pay

## 2023-12-17 MED ORDER — AREXVY 120 MCG/0.5ML IM SUSR
0.5000 mL | Freq: Once | INTRAMUSCULAR | 0 refills | Status: AC
  Filled 2023-12-17: qty 0.5, 1d supply, fill #0

## 2023-12-25 ENCOUNTER — Other Ambulatory Visit (HOSPITAL_BASED_OUTPATIENT_CLINIC_OR_DEPARTMENT_OTHER): Payer: Self-pay

## 2023-12-25 DIAGNOSIS — N3289 Other specified disorders of bladder: Secondary | ICD-10-CM | POA: Diagnosis not present

## 2023-12-25 DIAGNOSIS — K219 Gastro-esophageal reflux disease without esophagitis: Secondary | ICD-10-CM | POA: Diagnosis not present

## 2023-12-25 MED ORDER — OMEPRAZOLE 20 MG PO CPDR
20.0000 mg | DELAYED_RELEASE_CAPSULE | Freq: Every day | ORAL | 0 refills | Status: AC | PRN
  Filled 2023-12-25: qty 30, 30d supply, fill #0

## 2023-12-25 MED ORDER — PHENAZOPYRIDINE HCL 100 MG PO TABS: ORAL_TABLET | ORAL | 0 refills | Status: DC

## 2023-12-25 MED ORDER — SULFAMETHOXAZOLE-TRIMETHOPRIM 800-160 MG PO TABS
1.0000 | ORAL_TABLET | Freq: Two times a day (BID) | ORAL | 0 refills | Status: DC
  Filled 2023-12-25: qty 6, 3d supply, fill #0

## 2024-01-26 DIAGNOSIS — R030 Elevated blood-pressure reading, without diagnosis of hypertension: Secondary | ICD-10-CM | POA: Diagnosis not present

## 2024-01-27 ENCOUNTER — Other Ambulatory Visit: Payer: Self-pay

## 2024-01-29 DIAGNOSIS — K219 Gastro-esophageal reflux disease without esophagitis: Secondary | ICD-10-CM | POA: Diagnosis not present

## 2024-01-29 DIAGNOSIS — R002 Palpitations: Secondary | ICD-10-CM | POA: Diagnosis not present

## 2024-01-29 DIAGNOSIS — R760 Raised antibody titer: Secondary | ICD-10-CM | POA: Diagnosis not present

## 2024-01-29 DIAGNOSIS — I700 Atherosclerosis of aorta: Secondary | ICD-10-CM | POA: Diagnosis not present

## 2024-02-02 ENCOUNTER — Other Ambulatory Visit: Payer: Self-pay | Admitting: *Deleted

## 2024-02-02 DIAGNOSIS — R002 Palpitations: Secondary | ICD-10-CM

## 2024-02-04 DIAGNOSIS — M25641 Stiffness of right hand, not elsewhere classified: Secondary | ICD-10-CM | POA: Diagnosis not present

## 2024-02-04 DIAGNOSIS — R002 Palpitations: Secondary | ICD-10-CM | POA: Diagnosis not present

## 2024-02-04 DIAGNOSIS — M25642 Stiffness of left hand, not elsewhere classified: Secondary | ICD-10-CM | POA: Diagnosis not present

## 2024-02-04 DIAGNOSIS — R760 Raised antibody titer: Secondary | ICD-10-CM | POA: Diagnosis not present

## 2024-02-06 DIAGNOSIS — R002 Palpitations: Secondary | ICD-10-CM | POA: Diagnosis not present

## 2024-02-16 DIAGNOSIS — K080 Exfoliation of teeth due to systemic causes: Secondary | ICD-10-CM | POA: Diagnosis not present

## 2024-02-17 DIAGNOSIS — D7289 Other specified disorders of white blood cells: Secondary | ICD-10-CM | POA: Diagnosis not present

## 2024-02-17 DIAGNOSIS — D709 Neutropenia, unspecified: Secondary | ICD-10-CM | POA: Diagnosis not present

## 2024-02-26 DIAGNOSIS — D709 Neutropenia, unspecified: Secondary | ICD-10-CM | POA: Diagnosis not present

## 2024-03-07 DIAGNOSIS — D709 Neutropenia, unspecified: Secondary | ICD-10-CM | POA: Diagnosis not present

## 2024-03-15 ENCOUNTER — Ambulatory Visit: Attending: Internal Medicine

## 2024-03-15 DIAGNOSIS — D709 Neutropenia, unspecified: Secondary | ICD-10-CM | POA: Diagnosis not present

## 2024-03-15 DIAGNOSIS — R002 Palpitations: Secondary | ICD-10-CM | POA: Diagnosis not present

## 2024-03-31 ENCOUNTER — Ambulatory Visit: Attending: Cardiology | Admitting: Cardiology

## 2024-03-31 ENCOUNTER — Other Ambulatory Visit (HOSPITAL_COMMUNITY): Payer: Self-pay

## 2024-03-31 ENCOUNTER — Encounter: Payer: Self-pay | Admitting: Cardiology

## 2024-03-31 ENCOUNTER — Telehealth: Payer: Self-pay

## 2024-03-31 VITALS — BP 154/96 | HR 91 | Ht 65.0 in | Wt 129.4 lb

## 2024-03-31 DIAGNOSIS — I493 Ventricular premature depolarization: Secondary | ICD-10-CM

## 2024-03-31 DIAGNOSIS — R002 Palpitations: Secondary | ICD-10-CM | POA: Diagnosis not present

## 2024-03-31 DIAGNOSIS — I700 Atherosclerosis of aorta: Secondary | ICD-10-CM | POA: Diagnosis not present

## 2024-03-31 MED ORDER — METOPROLOL SUCCINATE ER 50 MG PO TB24
50.0000 mg | ORAL_TABLET | Freq: Every evening | ORAL | 3 refills | Status: DC
  Filled 2024-03-31: qty 90, 90d supply, fill #0

## 2024-03-31 NOTE — Progress Notes (Signed)
  Cardiology Office Note:  .   Date:  03/31/2024  ID:  NYONNA STANFILL, DOB January 01, 1956, MRN [id number] PCP: Mariah Batters, MD  [facility] Cardiologist:  Fransico Ivy, MD PCP: Mariah Batters, MD  Chief Complaint  Patient presents with   Palpitations     Mariah Everett is a 68 y.o. female with palpitations, PVC's, aortic atherosclerosis  History of Present Illness  Patient recently had episodes of palpitations associated with sharp left-sided chest pain and nausea like feeling.  Recent monitor showed 3% PVCs.  Blood pressure is elevated today.  Coronary CT angiogram in 12/2022 showed 0 calcium, scattered aortic atherosclerosis noted.    Vitals:   03/31/24 1313  BP: (!) 154/96  Pulse: 91      Review of Systems  Cardiovascular:  Positive for chest pain and palpitations. Negative for dyspnea on exertion, leg swelling and syncope.        Studies Reviewed: Mariah Everett        EKG 03/31/2024: Normal sinus rhythm Normal ECG When compared with ECG of 28-Jan-2021 21:37, No significant change was found Confirmed by Fransico Ivy [phone]) on 03/31/2024 1:21:29 PM    Independently interpreted 08/2023: Hb 14.5 Cr 0.64  09/2021: Chol 199, TG 58, HDL 83, LDL 102  Remote outpatient extended EKG monitoring for palpitations for 22 days starting 02/06/2024. Predominant rhythm was normal sinus rhythm with minimum heart rate 56 bpm and maximum heart rate of 171 bpm with average heart rate of 82 bpm. There were no patient triggered events (symptomatic events).   Auto triggered events were 17, occasional PACs and PVCs.  There was 1 NSVT episode of 4 beats on 02/08/2024 at 20:24 hours. Approximately 2000 PACs with PAC burden of <1% and 46,000 PVCs with PVC burden of 3%. There was no heart block, no atrial fibrillation.  Cor CTA 12/2022: No significant CAD Calcium score 0 Scattered aortic atherosclerosis    Physical Exam Vitals and nursing note reviewed.   Constitutional:      General: She is not in acute distress. Neck:     Vascular: No JVD.  Cardiovascular:     Rate and Rhythm: Normal rate and regular rhythm.     Heart sounds: Normal heart sounds. No murmur heard. Pulmonary:     Effort: Pulmonary effort is normal.     Breath sounds: Normal breath sounds. No wheezing or rales.  Musculoskeletal:     Right lower leg: No edema.     Left lower leg: No edema.      VISIT DIAGNOSES:   ICD-10-CM   1. Palpitations  R00.2 EKG 12-Lead    ECHOCARDIOGRAM COMPLETE    2. PVC (premature ventricular contraction)  I49.3 ECHOCARDIOGRAM COMPLETE    3. Aortic atherosclerosis (HCC)  I70.0 Lipid panel       Mariah DIGIOVANNA is a 68 y.o. female with  palpitations, PVC's, aortic atherosclerosis Assessment & Plan  PVCs: 3% PVCs, likely follow-up for palpitation as well as chest pain symptoms. Recommend metoprolol  succinate 50 mg daily.  This should help with blood pressure as well. Check echocardiogram.  Should also help assess patient's LV function given recent diagnosis of T-cell clonality, something patient is concerned about.  Aortic atherosclerosis: Check lipid panel to ensure non-HDL cholesterol <100.       No orders of the defined types were placed in this encounter.    F/u in 3 months  Signed, Cody Das, MD

## 2024-03-31 NOTE — Telephone Encounter (Signed)
 error

## 2024-03-31 NOTE — Patient Instructions (Addendum)
 Medication Instructions:  Start Metoprolol  XL 50 mg nightly   Stop Aspirin    *If you need a refill on your cardiac medications before your next appointment, please call your pharmacy*  Lab Work: Lipid panel   If you have labs (blood work) drawn today and your tests are completely normal, you will receive your results only by: MyChart Message (if you have MyChart) OR A paper copy in the mail If you have any lab test that is abnormal or we need to change your treatment, we will call you to review the results.  Testing/Procedures: Echo   Your physician has requested that you have an echocardiogram. Echocardiography is a painless test that uses sound waves to create images of your heart. It provides your doctor with information about the size and shape of your heart and how well your heart's chambers and valves are working. This procedure takes approximately one hour. There are no restrictions for this procedure. Please do NOT wear cologne, perfume, aftershave, or lotions (deodorant is allowed). Please arrive 15 minutes prior to your appointment time.  Please note: We ask at that you not bring children with you during ultrasound (echo/ vascular) testing. Due to room size and safety concerns, children are not allowed in the ultrasound rooms during exams. Our front office staff cannot provide observation of children in our lobby area while testing is being conducted. An adult accompanying a patient to their appointment will only be allowed in the ultrasound room at the discretion of the ultrasound technician under special circumstances. We apologize for any inconvenience.  Follow-Up: At [facility], you and your health needs are our priority.  As part of our continuing mission to provide you with exceptional heart care, our providers are all part of one team.  This team includes your primary Cardiologist (physician) and Advanced Practice Providers or APPs (Physician Assistants and Nurse  Practitioners) who all work together to provide you with the care you need, when you need it.  Your next appointment:   3 month(s)  Provider:   Cody Das, MD

## 2024-04-02 LAB — LIPID PANEL
Chol/HDL Ratio: 2.5 ratio (ref 0.0–4.4)
Cholesterol, Total: 220 mg/dL — ABNORMAL HIGH (ref 100–199)
HDL: 88 mg/dL (ref >39)
LDL Chol Calc (NIH): 121 mg/dL — ABNORMAL HIGH (ref 0–99)
Triglycerides: 64 mg/dL (ref 0–149)
VLDL Cholesterol Cal: 11 mg/dL (ref 5–40)

## 2024-04-02 NOTE — Progress Notes (Signed)
 In addition to heart healthy diet and lifestyle, recommend Crestor 20 mg daily to reduce LDL below 70. Repeat lipid panel in 3 months.  Thanks MJP

## 2024-04-04 ENCOUNTER — Other Ambulatory Visit (HOSPITAL_BASED_OUTPATIENT_CLINIC_OR_DEPARTMENT_OTHER): Payer: Self-pay

## 2024-04-04 ENCOUNTER — Other Ambulatory Visit: Payer: Self-pay

## 2024-04-04 DIAGNOSIS — E782 Mixed hyperlipidemia: Secondary | ICD-10-CM

## 2024-04-04 MED ORDER — ROSUVASTATIN CALCIUM 20 MG PO TABS
20.0000 mg | ORAL_TABLET | Freq: Every day | ORAL | 3 refills | Status: DC
  Filled 2024-04-04: qty 90, 90d supply, fill #0

## 2024-04-08 ENCOUNTER — Ambulatory Visit: Payer: Self-pay

## 2024-04-08 DIAGNOSIS — Z79899 Other long term (current) drug therapy: Secondary | ICD-10-CM

## 2024-04-08 DIAGNOSIS — E782 Mixed hyperlipidemia: Secondary | ICD-10-CM

## 2024-04-08 DIAGNOSIS — I700 Atherosclerosis of aorta: Secondary | ICD-10-CM

## 2024-04-11 ENCOUNTER — Telehealth: Payer: Self-pay | Admitting: Cardiology

## 2024-04-11 ENCOUNTER — Encounter: Payer: Self-pay | Admitting: *Deleted

## 2024-04-11 DIAGNOSIS — E782 Mixed hyperlipidemia: Secondary | ICD-10-CM | POA: Diagnosis not present

## 2024-04-11 DIAGNOSIS — Z79899 Other long term (current) drug therapy: Secondary | ICD-10-CM | POA: Diagnosis not present

## 2024-04-11 DIAGNOSIS — I700 Atherosclerosis of aorta: Secondary | ICD-10-CM | POA: Diagnosis not present

## 2024-04-11 NOTE — Progress Notes (Signed)
 Typically, LDL will not be affected by nonfasting status, unless triglycerides are very high.

## 2024-04-11 NOTE — Telephone Encounter (Signed)
 The patient has been notified of the result and verbalized understanding.  All questions (if any) were answered.  Pt states she was not fasting at the time these lipids were drawn.  Pt states she would like to have her lipids rechecked, and come fasting to that lab appt.  She states if at that time her LDL is still high, she will proceed with recommendations to start crestor  at that time, per Dr. Filiberto Hug.  Pt aware I will place another lipid panel in the system for her to go and have drawn at her earliest convenience.  Pt aware to go fasting to this lab appt (pt education provided about this).    Informed the pt that I will make Dr. Filiberto Hug of this plan/preference.   Pt verbalized understanding and agrees with this plan.

## 2024-04-11 NOTE — Telephone Encounter (Signed)
 Pt contacted and advised. Pt still wants to have her cholesterol redone. Order in system.

## 2024-04-11 NOTE — Telephone Encounter (Signed)
-----   Message from [facility] sent at 04/02/2024  3:19 PM EDT ----- In addition to heart healthy diet and lifestyle, recommend Crestor  20 mg daily to reduce LDL below 70. Repeat lipid panel in 3 months.  Thanks MJP

## 2024-04-11 NOTE — Telephone Encounter (Signed)
 Cody Das, MD to Me  Evan Hillock, RN     04/11/24  9:57 AM Note Typically, LDL will not be affected by nonfasting status, unless triglycerides are very high.    Lipid panel Me    04/11/24  9:51 AM Result Note The patient has been notified of the result and verbalized understanding.  All questions (if any) were answered. Pt states she was not fasting at the time these lipids were drawn. Pt states she would like to have her lipids rechecked, and come fasting to that lab appt.  She states if at that time her LDL is still high, she will proceed with recommendations to start crestor  at that time, per Dr. Filiberto Hug. Pt aware I will place another lipid panel in the system for her to go and have drawn at her earliest convenience.  Pt aware to go fasting to this lab appt (pt education provided about this).   Informed the pt that I will make Dr. Filiberto Hug of this plan/preference. Pt verbalized understanding and agrees with this plan. Lipid panel Me to Cody Das, MD  Evan Hillock, RN     04/11/24  9:51 AM  Arlie Lain- she wants to repeat her lipids and go fasting, to see what the LDL will be at that time--if LDL still high with fasting lipids, she will proceed with starting crestor  at that time  Coy Ditty Me    04/11/24  9:51 AM Note The patient has been notified of the result and verbalized understanding.  All questions (if any) were answered.   Pt states she was not fasting at the time these lipids were drawn.   Pt states she would like to have her lipids rechecked, and come fasting to that lab appt.  She states if at that time her LDL is still high, she will proceed with recommendations to start crestor  at that time, per Dr. Filiberto Hug.   Pt aware I will place another lipid panel in the system for her to go and have drawn at her earliest convenience.  Pt aware to go fasting to this lab appt (pt education provided about this).     Informed the pt that I will make  Dr. Filiberto Hug of this plan/preference.    Pt verbalized understanding and agrees with this plan.         04/11/24  9:49 AM You routed this conversation to Cody Das, MD    04/11/24  9:45 AM You attempted to contact Delmy, Holdren (Completed)  Me    04/11/24  9:45 AM Note ----- Message from Kaye Parsons Patwardhan sent at 04/02/2024  3:19 PM EDT ----- In addition to heart healthy diet and lifestyle, recommend Crestor  20 mg daily to reduce LDL below 70. Repeat lipid panel in 3 months.   Thanks MJP

## 2024-04-11 NOTE — Telephone Encounter (Signed)
  Patient returning call regarding results 

## 2024-04-11 NOTE — Progress Notes (Signed)
Review telephone encounter

## 2024-04-12 LAB — LIPID PANEL
Chol/HDL Ratio: 2.3 ratio (ref 0.0–4.4)
Chol/HDL Ratio: 2.3 ratio (ref 0.0–4.4)
Cholesterol, Total: 181 mg/dL (ref 100–199)
Cholesterol, Total: 182 mg/dL (ref 100–199)
HDL: 78 mg/dL (ref >39)
HDL: 79 mg/dL (ref >39)
LDL Chol Calc (NIH): 91 mg/dL (ref 0–99)
LDL Chol Calc (NIH): 91 mg/dL (ref 0–99)
Triglycerides: 64 mg/dL (ref 0–149)
Triglycerides: 61 mg/dL (ref 0–149)
VLDL Cholesterol Cal: 12 mg/dL (ref 5–40)
VLDL Cholesterol Cal: 12 mg/dL (ref 5–40)

## 2024-05-05 ENCOUNTER — Ambulatory Visit (HOSPITAL_COMMUNITY)
Admission: RE | Admit: 2024-05-05 | Discharge: 2024-05-05 | Disposition: A | Discharge status: Home / Self Care | Source: Ambulatory Visit | Attending: Cardiology | Admitting: Cardiology

## 2024-05-05 DIAGNOSIS — I700 Atherosclerosis of aorta: Secondary | ICD-10-CM

## 2024-05-05 DIAGNOSIS — R002 Palpitations: Secondary | ICD-10-CM | POA: Insufficient documentation

## 2024-05-05 DIAGNOSIS — I493 Ventricular premature depolarization: Secondary | ICD-10-CM

## 2024-05-05 LAB — ECHOCARDIOGRAM COMPLETE
Area-P 1/2: 3.81 cm2
S' Lateral: 3.1 cm

## 2024-05-06 NOTE — Progress Notes (Signed)
 Noted.  Normal pumping function of the heart. No severe heart valve abnormalities noted.    Thanks MJP

## 2024-06-02 DIAGNOSIS — E539 Vitamin B deficiency, unspecified: Secondary | ICD-10-CM | POA: Diagnosis not present

## 2024-06-02 DIAGNOSIS — R030 Elevated blood-pressure reading, without diagnosis of hypertension: Secondary | ICD-10-CM | POA: Diagnosis not present

## 2024-06-03 ENCOUNTER — Other Ambulatory Visit (HOSPITAL_BASED_OUTPATIENT_CLINIC_OR_DEPARTMENT_OTHER): Payer: Self-pay

## 2024-06-03 DIAGNOSIS — R3989 Other symptoms and signs involving the genitourinary system: Secondary | ICD-10-CM | POA: Diagnosis not present

## 2024-06-03 DIAGNOSIS — N3001 Acute cystitis with hematuria: Secondary | ICD-10-CM | POA: Diagnosis not present

## 2024-06-03 DIAGNOSIS — N3289 Other specified disorders of bladder: Secondary | ICD-10-CM | POA: Diagnosis not present

## 2024-06-03 MED ORDER — PHENAZOPYRIDINE HCL 200 MG PO TABS
200.0000 mg | ORAL_TABLET | Freq: Three times a day (TID) | ORAL | 0 refills | Status: DC
  Filled 2024-06-03: qty 9, 3d supply, fill #0

## 2024-06-03 MED ORDER — CEPHALEXIN 500 MG PO CAPS: 500.0000 mg | ORAL_CAPSULE | Freq: Four times a day (QID) | ORAL | 0 refills | Status: AC

## 2024-06-03 MED ORDER — CEPHALEXIN 500 MG PO CAPS
500.0000 mg | ORAL_CAPSULE | Freq: Four times a day (QID) | ORAL | 0 refills | Status: AC
  Filled 2024-06-03: qty 20, 5d supply, fill #0

## 2024-06-07 DIAGNOSIS — R002 Palpitations: Secondary | ICD-10-CM | POA: Diagnosis not present

## 2024-06-07 DIAGNOSIS — R030 Elevated blood-pressure reading, without diagnosis of hypertension: Secondary | ICD-10-CM | POA: Diagnosis not present

## 2024-06-07 DIAGNOSIS — D709 Neutropenia, unspecified: Secondary | ICD-10-CM | POA: Diagnosis not present

## 2024-06-07 DIAGNOSIS — I700 Atherosclerosis of aorta: Secondary | ICD-10-CM | POA: Diagnosis not present

## 2024-07-14 ENCOUNTER — Ambulatory Visit: Attending: Cardiology | Admitting: Cardiology

## 2024-07-14 ENCOUNTER — Encounter: Payer: Self-pay | Admitting: Cardiology

## 2024-07-14 VITALS — BP 116/78 | HR 99 | Ht 65.0 in | Wt 128.0 lb

## 2024-07-14 DIAGNOSIS — I493 Ventricular premature depolarization: Secondary | ICD-10-CM | POA: Diagnosis not present

## 2024-07-14 DIAGNOSIS — I700 Atherosclerosis of aorta: Secondary | ICD-10-CM

## 2024-07-14 NOTE — Patient Instructions (Signed)
 Medication Instructions:  STOP Crestor  STOP Metoprolol    *If you need a refill on your cardiac medications before your next appointment, please call your pharmacy*  Follow-Up: At [facility], you and your health needs are our priority.  As part of our continuing mission to provide you with exceptional heart care, our providers are all part of one team.  This team includes your primary Cardiologist (physician) and Advanced Practice Providers or APPs (Physician Assistants and Nurse Practitioners) who all work together to provide you with the care you need, when you need it.  Your next appointment:   As needed   Provider:   Newman JINNY Lawrence, MD

## 2024-07-14 NOTE — Progress Notes (Signed)
 Cardiology Office Note:  .   Date:  07/14/2024  ID:  [name], DOB [birth year], MRN [id number] PCP: Roanna Ezekiel NOVAK, MD  [facility] Providers Cardiologist:  Newman Lawrence, MD PCP: Roanna Ezekiel NOVAK, MD  Chief Complaint  Patient presents with   Palpitations   PVC's     Mariah Everett is a 68 y.o. female with palpitations, PVC's, aortic atherosclerosis  History of Present Illness  Patient is doing well, complains of palpitation.  She is not currently taking metoprolol , but has not had any palpitations.  Blood pressure is well-controlled.  She is taking aspirin , but not taking statin.    Vitals:   07/14/24 1135  BP: 116/78  Pulse: 99  SpO2: 98%       Review of Systems  Cardiovascular:  Positive for chest pain and palpitations. Negative for dyspnea on exertion, leg swelling and syncope.        Studies Reviewed: SABRA        EKG 03/31/2024: Normal sinus rhythm Normal ECG When compared with ECG of 28-Jan-2021 21:37, No significant change was found Confirmed by Lawrence Newman [phone]) on 03/31/2024 1:21:29 PM    Echocardiogram 04/2024:  1. Left ventricular ejection fraction, by estimation, is 55 to 60%. Left  ventricular ejection fraction by 3D volume is 64 %. The left ventricle has  normal function. The left ventricle has no regional wall motion  abnormalities. Left ventricular diastolic   parameters were normal.   2. Right ventricular systolic function is normal. The right ventricular  size is normal. There is normal pulmonary artery systolic pressure.   3. The mitral valve is normal in structure. Trivial mitral valve  regurgitation. No evidence of mitral stenosis.   4. The aortic valve is normal in structure. Aortic valve regurgitation is  not visualized. No aortic stenosis is present.   5. The inferior vena cava is normal in size with greater than 50%  respiratory variability, suggesting right atrial pressure of 3 mmHg.    Labs 03/2024: Chol 182, TG 61, HDL 79, LDL 91  08/2023: Hb 14.5 Cr 0.64  09/2021: Chol 199, TG 58, HDL 83, LDL 102  Remote outpatient extended EKG monitoring for palpitations for 22 days starting 02/06/2024. Predominant rhythm was normal sinus rhythm with minimum heart rate 56 bpm and maximum heart rate of 171 bpm with average heart rate of 82 bpm. There were no patient triggered events (symptomatic events).   Auto triggered events were 17, occasional PACs and PVCs.  There was 1 NSVT episode of 4 beats on 02/08/2024 at 20:24 hours. Approximately 2000 PACs with PAC burden of <1% and 46,000 PVCs with PVC burden of 3%. There was no heart block, no atrial fibrillation.  Cor CTA 12/2022: No significant CAD Calcium  score 0 Scattered aortic atherosclerosis    Physical Exam Vitals and nursing note reviewed.  Constitutional:      General: She is not in acute distress. Neck:     Vascular: No JVD.  Cardiovascular:     Rate and Rhythm: Normal rate and regular rhythm.     Heart sounds: Normal heart sounds. No murmur heard. Pulmonary:     Effort: Pulmonary effort is normal.     Breath sounds: Normal breath sounds. No wheezing or rales.  Musculoskeletal:     Right lower leg: No edema.     Left lower leg: No edema.      VISIT DIAGNOSES:   ICD-10-CM   1. PVC (premature ventricular contraction)  I49.3  2. Aortic atherosclerosis (HCC)  I70.0         Mariah Everett is a 68 y.o. female with  palpitations, PVC's, aortic atherosclerosis Assessment & Plan  PVCs: Structurally normal heart.  Symptoms controlled without metoprolol .  Reasonable to hold.  Aortic atherosclerosis: LDL 91, non-HDL only slightly better than 100 at 103.  Reasonable to, statin at this time.  Recommend repeating calcium  score scan in 3-5 years for risk stratification. In absence of bleeding issues, reasonable to continue aspirin  81 mg daily.   F/u in 3 months  Signed, Newman JINNY Lawrence, MD

## 2024-07-26 DIAGNOSIS — D7289 Other specified disorders of white blood cells: Secondary | ICD-10-CM | POA: Diagnosis not present

## 2024-07-26 DIAGNOSIS — D709 Neutropenia, unspecified: Secondary | ICD-10-CM | POA: Diagnosis not present

## 2024-08-10 ENCOUNTER — Other Ambulatory Visit (HOSPITAL_BASED_OUTPATIENT_CLINIC_OR_DEPARTMENT_OTHER): Payer: Self-pay

## 2024-08-18 ENCOUNTER — Other Ambulatory Visit (HOSPITAL_BASED_OUTPATIENT_CLINIC_OR_DEPARTMENT_OTHER): Payer: Self-pay

## 2024-08-18 MED ORDER — FLUZONE HIGH-DOSE 0.5 ML IM SUSY
0.5000 mL | PREFILLED_SYRINGE | Freq: Once | INTRAMUSCULAR | 0 refills | Status: AC
  Filled 2024-08-18: qty 0.5, 1d supply, fill #0

## 2024-08-23 DIAGNOSIS — K080 Exfoliation of teeth due to systemic causes: Secondary | ICD-10-CM | POA: Diagnosis not present

## 2024-09-21 DIAGNOSIS — H25813 Combined forms of age-related cataract, bilateral: Secondary | ICD-10-CM | POA: Diagnosis not present

## 2024-09-21 DIAGNOSIS — H40023 Open angle with borderline findings, high risk, bilateral: Secondary | ICD-10-CM | POA: Diagnosis not present

## 2024-10-14 DIAGNOSIS — E7800 Pure hypercholesterolemia, unspecified: Secondary | ICD-10-CM | POA: Diagnosis not present

## 2024-10-14 DIAGNOSIS — R062 Wheezing: Secondary | ICD-10-CM | POA: Diagnosis not present

## 2024-10-14 DIAGNOSIS — R760 Raised antibody titer: Secondary | ICD-10-CM | POA: Diagnosis not present

## 2024-10-14 DIAGNOSIS — E559 Vitamin D deficiency, unspecified: Secondary | ICD-10-CM | POA: Diagnosis not present

## 2024-10-26 ENCOUNTER — Other Ambulatory Visit (HOSPITAL_BASED_OUTPATIENT_CLINIC_OR_DEPARTMENT_OTHER): Payer: Self-pay

## 2024-10-26 MED ORDER — OMEPRAZOLE 20 MG PO CPDR
20.0000 mg | DELAYED_RELEASE_CAPSULE | Freq: Every day | ORAL | 0 refills | Status: AC | PRN
  Filled 2024-10-26: qty 30, 30d supply, fill #0

## 2024-10-26 MED ORDER — CALCIUM CARB-CHOLECALCIFEROL 600-10 MG-MCG PO TABS
ORAL_TABLET | ORAL | 2 refills | Status: AC
  Filled 2024-10-26: qty 150, 75d supply, fill #0

## 2024-10-26 MED ORDER — ROSUVASTATIN CALCIUM 10 MG PO TABS
10.0000 mg | ORAL_TABLET | Freq: Every evening | ORAL | 1 refills | Status: AC
  Filled 2024-10-26: qty 90, 90d supply, fill #0

## 2024-10-26 MED ORDER — CETIRIZINE HCL 10 MG PO TABS
10.0000 mg | ORAL_TABLET | Freq: Every evening | ORAL | 1 refills | Status: AC
  Filled 2024-10-26: qty 100, 100d supply, fill #0

## 2024-10-26 MED ORDER — FLUTICASONE PROPIONATE 50 MCG/ACT NA SUSP
NASAL | 3 refills | Status: AC
  Filled 2024-10-26: qty 16, 30d supply, fill #0

## 2024-10-28 DIAGNOSIS — D709 Neutropenia, unspecified: Secondary | ICD-10-CM | POA: Diagnosis not present

## 2024-11-09 ENCOUNTER — Other Ambulatory Visit (HOSPITAL_BASED_OUTPATIENT_CLINIC_OR_DEPARTMENT_OTHER): Payer: Self-pay

## 2024-11-09 DIAGNOSIS — N952 Postmenopausal atrophic vaginitis: Secondary | ICD-10-CM | POA: Diagnosis not present

## 2024-11-09 DIAGNOSIS — Z1231 Encounter for screening mammogram for malignant neoplasm of breast: Secondary | ICD-10-CM | POA: Diagnosis not present

## 2024-11-09 DIAGNOSIS — N9411 Superficial (introital) dyspareunia: Secondary | ICD-10-CM | POA: Diagnosis not present

## 2024-11-09 DIAGNOSIS — Z124 Encounter for screening for malignant neoplasm of cervix: Secondary | ICD-10-CM | POA: Diagnosis not present

## 2024-11-09 MED ORDER — ESTRADIOL 0.01 % VA CREA
TOPICAL_CREAM | VAGINAL | 6 refills | Status: AC
  Filled 2024-11-09: qty 42.5, 45d supply, fill #0
